# Patient Record
Sex: Female | Born: 1950
Health system: Southern US, Community
[De-identification: ages and names within clinical notes are randomized; demographics above are authoritative.]

## PROBLEM LIST (undated history)

## (undated) DIAGNOSIS — Z87442 Personal history of urinary calculi: Secondary | ICD-10-CM

## (undated) DIAGNOSIS — I2510 Atherosclerotic heart disease of native coronary artery without angina pectoris: Secondary | ICD-10-CM

## (undated) DIAGNOSIS — K219 Gastro-esophageal reflux disease without esophagitis: Secondary | ICD-10-CM

## (undated) DIAGNOSIS — E079 Disorder of thyroid, unspecified: Secondary | ICD-10-CM

## (undated) DIAGNOSIS — I10 Essential (primary) hypertension: Secondary | ICD-10-CM

## (undated) DIAGNOSIS — M1990 Unspecified osteoarthritis, unspecified site: Secondary | ICD-10-CM

## (undated) DIAGNOSIS — E039 Hypothyroidism, unspecified: Secondary | ICD-10-CM

## (undated) HISTORY — DX: Essential (primary) hypertension: I10

## (undated) HISTORY — DX: Disorder of thyroid, unspecified: E07.9

## (undated) HISTORY — DX: Atherosclerotic heart disease of native coronary artery without angina pectoris: I25.10

## (undated) HISTORY — PX: ABDOMINAL HYSTERECTOMY: SHX81

## (undated) HISTORY — PX: TONSILLECTOMY: SUR1361

## (undated) HISTORY — PX: APPENDECTOMY: SHX54

## (undated) HISTORY — DX: Unspecified osteoarthritis, unspecified site: M19.90

---

## 2000-12-10 ENCOUNTER — Ambulatory Visit (HOSPITAL_BASED_OUTPATIENT_CLINIC_OR_DEPARTMENT_OTHER): Admission: RE | Admit: 2000-12-10 | Discharge: 2000-12-10 | Payer: Self-pay | Admitting: Plastic Surgery

## 2003-11-21 ENCOUNTER — Ambulatory Visit (HOSPITAL_COMMUNITY): Admission: RE | Admit: 2003-11-21 | Discharge: 2003-11-21 | Payer: Self-pay | Admitting: Chiropractic Medicine

## 2006-03-27 ENCOUNTER — Ambulatory Visit (HOSPITAL_COMMUNITY): Admission: RE | Admit: 2006-03-27 | Discharge: 2006-03-27 | Payer: Self-pay | Admitting: General Surgery

## 2006-04-07 ENCOUNTER — Ambulatory Visit (HOSPITAL_COMMUNITY): Admission: RE | Admit: 2006-04-07 | Discharge: 2006-04-07 | Payer: Self-pay | Admitting: General Surgery

## 2010-12-07 ENCOUNTER — Encounter: Payer: Self-pay | Admitting: Endocrinology

## 2010-12-08 ENCOUNTER — Encounter: Payer: Self-pay | Admitting: General Surgery

## 2011-11-18 HISTORY — PX: KNEE SURGERY: SHX244

## 2012-04-01 ENCOUNTER — Ambulatory Visit
Admission: RE | Admit: 2012-04-01 | Discharge: 2012-04-01 | Disposition: A | Discharge status: Home / Self Care | Payer: PRIVATE HEALTH INSURANCE | Source: Ambulatory Visit | Attending: Family Medicine | Admitting: Family Medicine

## 2012-04-01 ENCOUNTER — Other Ambulatory Visit: Payer: Self-pay | Admitting: Family Medicine

## 2012-04-01 DIAGNOSIS — E049 Nontoxic goiter, unspecified: Secondary | ICD-10-CM

## 2012-11-01 ENCOUNTER — Ambulatory Visit (INDEPENDENT_AMBULATORY_CARE_PROVIDER_SITE_OTHER): Payer: PRIVATE HEALTH INSURANCE | Admitting: Internal Medicine

## 2012-11-01 ENCOUNTER — Ambulatory Visit: Payer: PRIVATE HEALTH INSURANCE

## 2012-11-01 VITALS — BP 166/87 | HR 97 | Temp 100.3°F | Resp 20 | Ht 63.0 in | Wt 152.0 lb

## 2012-11-01 DIAGNOSIS — J329 Chronic sinusitis, unspecified: Secondary | ICD-10-CM

## 2012-11-01 DIAGNOSIS — J3489 Other specified disorders of nose and nasal sinuses: Secondary | ICD-10-CM

## 2012-11-01 DIAGNOSIS — R509 Fever, unspecified: Secondary | ICD-10-CM

## 2012-11-01 LAB — POCT CBC
Granulocyte percent: 86.7 %G — AB (ref 37–80)
HCT, POC: 37.9 % (ref 37.7–47.9)
Hemoglobin: 11.7 g/dL — AB (ref 12.2–16.2)
Lymph, poc: 1.6 (ref 0.6–3.4)
MCH, POC: 28.1 pg (ref 27–31.2)
MCHC: 30.9 g/dL — AB (ref 31.8–35.4)
MCV: 90.8 fL (ref 80–97)
MID (cbc): 0.8 (ref 0–0.9)
MPV: 8.6 fL (ref 0–99.8)
POC Granulocyte: 15.6 — AB (ref 2–6.9)
POC LYMPH PERCENT: 9.1 %L — AB (ref 10–50)
POC MID %: 4.2 %M (ref 0–12)
Platelet Count, POC: 323 10*3/uL (ref 142–424)
RBC: 4.17 M/uL (ref 4.04–5.48)
RDW, POC: 158 %
WBC: 18 10*3/uL — AB (ref 4.6–10.2)

## 2012-11-01 LAB — POCT INFLUENZA A/B
Influenza A, POC: NEGATIVE
Influenza B, POC: NEGATIVE

## 2012-11-01 MED ORDER — PREDNISONE 20 MG PO TABS: ORAL_TABLET | ORAL | Status: DC

## 2012-11-01 MED ORDER — HYDROCODONE-ACETAMINOPHEN 5-500 MG PO TABS: 1.0000 | ORAL_TABLET | Freq: Three times a day (TID) | ORAL | Status: DC | PRN

## 2012-11-01 MED ORDER — AMOXICILLIN-POT CLAVULANATE 875-125 MG PO TABS: 1.0000 | ORAL_TABLET | Freq: Two times a day (BID) | ORAL | Status: DC

## 2012-11-01 NOTE — Patient Instructions (Addendum)
Start Augmentin tonight - take twice daily for 10 days Alternate tylenol 1000 mg with ibuprofen 400-600 mg every 3 hours. Lortab at night for pain May use Afrin nasal spray at night for no more than 3 days Follow up if no improvement in 48-72 hours

## 2012-11-01 NOTE — Progress Notes (Signed)
Subjective:    Patient ID: Stephanie Daniel, female    DOB: 1951-03-22, 61 y.o.   MRN: [id number]  HPI 61 year old female presents with 4 day history of sinus pain, fever, chills, and slight dry cough.  States she had a flu-like illness last week that resolved, and then she developed her current symptoms. Has history of sinus infections with last one being about a year ago. She did have sinus surgery when she was in her 70's.  Has been taking Tylenol Cold & Sinus and also used a netty pot last night.  Complains of severe frontal and maxillary sinus pain.      Review of Systems  Constitutional: Positive for fever and chills.  HENT: Positive for congestion, sore throat, rhinorrhea, postnasal drip and sinus pressure. Negative for ear pain.   Respiratory: Positive for cough (dry).   Gastrointestinal: Negative for nausea, vomiting and abdominal pain.  Skin: Negative for rash.  Neurological: Positive for headaches. Negative for dizziness and numbness.  All other systems reviewed and are negative.       Objective:   Physical Exam  Constitutional: She is oriented to person, place, and time. She appears well-developed and well-nourished.  HENT:  Head: Normocephalic and atraumatic.  Right Ear: Hearing, tympanic membrane, external ear and ear canal normal.  Left Ear: Hearing, tympanic membrane, external ear and ear canal normal.  Nose: Right sinus exhibits maxillary sinus tenderness and frontal sinus tenderness. Left sinus exhibits maxillary sinus tenderness and frontal sinus tenderness.  Mouth/Throat: Uvula is midline, oropharynx is clear and moist and mucous membranes are normal. No oropharyngeal exudate.  Eyes: Conjunctivae normal are normal.  Neck: Normal range of motion.  Cardiovascular: Normal rate and normal heart sounds.   Pulmonary/Chest: Effort normal and breath sounds normal.  Neurological: She is alert and oriented to person, place, and time.  Psychiatric: She has a normal mood and  affect. Her behavior is normal. Judgment and thought content normal.      Results for orders placed in visit on 11/01/12  POCT CBC      Component Value Range   WBC 18.0 (*) 4.6 - 10.2 K/uL   Lymph, poc 1.6  0.6 - 3.4   POC LYMPH PERCENT 9.1 (*) 10 - 50 %L   MID (cbc) 0.8  0 - 0.9   POC MID % 4.2  0 - 12 %M   POC Granulocyte 15.6 (*) 2 - 6.9   Granulocyte percent 86.7 (*) 37 - 80 %G   RBC 4.17  4.04 - 5.48 M/uL   Hemoglobin 11.7 (*) 12.2 - 16.2 g/dL   HCT, POC 40.9  81.1 - 47.9 %   MCV 90.8  80 - 97 fL   MCH, POC 28.1  27 - 31.2 pg   MCHC 30.9 (*) 31.8 - 35.4 g/dL   RDW, POC 914     Platelet Count, POC 323  142 - 424 K/uL   MPV 8.6  0 - 99.8 fL  POCT INFLUENZA A/B      Component Value Range   Influenza A, POC Negative     Influenza B, POC Negative      [facility] reading (PRIMARY) by  Dr. Merla Riches as right frontal sinus opacity.       Assessment & Plan:   1. Sinusitis  DG Sinuses Complete, predniSONE (DELTASONE) 20 MG tablet  2. Sinus pain  DG Sinuses Complete, amoxicillin-clavulanate (AUGMENTIN) 875-125 MG per tablet, HYDROcodone-acetaminophen (VICODIN) 5-500 MG per tablet  3. Fever  POCT CBC, POCT Influenza A/B   Start Augmentin tonight Prednisone taper to start in a.m. Lortab as needed for pain Afrin at night no more than 3 days RTC if symptoms not improving in 48-72 hours   I have reviewed and agree with documentation/plan. Robert P. Merla Riches, M.D.

## 2012-11-05 ENCOUNTER — Telehealth: Payer: Self-pay

## 2012-11-05 MED ORDER — FLUCONAZOLE 150 MG PO TABS: 150.0000 mg | ORAL_TABLET | Freq: Once | ORAL | Status: DC

## 2012-11-05 NOTE — Telephone Encounter (Signed)
HEATHER,  PT REQUESTS RX FOR YEAST INFECTION AS YOU DISCUSSED AT LAST OV    PT PHONE [phone]  [facility] ON [address]

## 2012-11-05 NOTE — Telephone Encounter (Signed)
Called pt to advise Rx sent in

## 2013-05-22 ENCOUNTER — Ambulatory Visit (INDEPENDENT_AMBULATORY_CARE_PROVIDER_SITE_OTHER): Payer: BC Managed Care – PPO | Admitting: Physician Assistant

## 2013-05-22 VITALS — BP 124/70 | HR 78 | Temp 98.0°F | Resp 18 | Ht 63.5 in | Wt 156.0 lb

## 2013-05-22 DIAGNOSIS — L0291 Cutaneous abscess, unspecified: Secondary | ICD-10-CM

## 2013-05-22 DIAGNOSIS — M25569 Pain in unspecified knee: Secondary | ICD-10-CM

## 2013-05-22 DIAGNOSIS — M25562 Pain in left knee: Secondary | ICD-10-CM

## 2013-05-22 DIAGNOSIS — L0390 Cellulitis, unspecified: Secondary | ICD-10-CM

## 2013-05-22 LAB — POCT CBC
Granulocyte percent: 72.8 %G (ref 37–80)
MID (cbc): 0.7 (ref 0–0.9)
MPV: 9.2 fL (ref 0–99.8)
POC Granulocyte: 6.3 (ref 2–6.9)
POC LYMPH PERCENT: 19.5 %L (ref 10–50)
POC MID %: 7.7 %M (ref 0–12)
Platelet Count, POC: 251 10*3/uL (ref 142–424)
RBC: 4.33 M/uL (ref 4.04–5.48)
RDW, POC: 16.7 %

## 2013-05-22 MED ORDER — TRAMADOL HCL 50 MG PO TABS: 50.0000 mg | ORAL_TABLET | Freq: Three times a day (TID) | ORAL | Status: DC | PRN

## 2013-05-22 MED ORDER — DOXYCYCLINE HYCLATE 50 MG PO CAPS: 100.0000 mg | ORAL_CAPSULE | Freq: Two times a day (BID) | ORAL | Status: DC

## 2013-05-22 MED ORDER — DOXYCYCLINE HYCLATE 100 MG PO CAPS: 100.0000 mg | ORAL_CAPSULE | Freq: Two times a day (BID) | ORAL | Status: DC

## 2013-05-22 NOTE — Progress Notes (Signed)
Patient ID: Stephanie Daniel MRN: [id number], DOB: 1951-06-29, 62 y.o. Date of Encounter: 05/22/2013, 4:55 PM  Primary Physician: No primary provider on file.  Chief Complaint: Bug bite left leg  HPI: 62 y.o. female with history below presents with 5 day history of bump on the outside of her left knee. Lesion has slowly been enlarging. At first she thought this was a mosquito bite, but then it became painful. This morning she began to have some purulent drainage. She has been feeling ok up until today when she just feels fatigued. Afebrile. No chills. She notes some swelling of her left leg and ankle as well.    Past Medical History  Diagnosis Date  . Hypertension   . Thyroid disease   . Arthritis      Home Meds: Prior to Admission medications   Medication Sig Start Date End Date Taking? Authorizing Provider  calcium-vitamin D (OSCAL WITH D) 500-200 MG-UNIT per tablet Take 1 tablet by mouth daily.   Yes Historical Provider, MD  fish oil-omega-3 fatty acids 1000 MG capsule Take 2 g by mouth daily.   Yes Historical Provider, MD  levothyroxine (SYNTHROID, LEVOTHROID) 75 MCG tablet Take 75 mcg by mouth daily.   Yes Historical Provider, MD  lisinopril (PRINIVIL,ZESTRIL) 10 MG tablet Take 10 mg by mouth daily.   Yes Historical Provider, MD  meloxicam (MOBIC) 15 MG tablet Take 15 mg by mouth daily.   Yes Historical Provider, MD  omeprazole (PRILOSEC OTC) 20 MG tablet Take 20 mg by mouth daily.   Yes Historical Provider, MD    Allergies: No Known Allergies  History   Social History  . Marital Status: Married    Spouse Name: N/A    Number of Children: N/A  . Years of Education: N/A   Occupational History  . Not on file.   Social History Main Topics  . Smoking status: Former Games developer  . Smokeless tobacco: Not on file  . Alcohol Use: Not on file  . Drug Use: Not on file  . Sexually Active: Not on file   Other Topics Concern  . Not on file   Social History Narrative  . No  narrative on file     Review of Systems: Constitutional: positive for fatigue. negative for chills or fever  Dermatological: see above   Physical Exam: Blood pressure 124/70, pulse 78, temperature 98 F (36.7 C), temperature source Oral, resp. rate 18, height 5' 3.5" (1.613 m), weight 156 lb (70.761 kg), SpO2 98.00%., Body mass index is 27.2 kg/(m^2). General: Well developed, well nourished, in no acute distress. Head: Normocephalic, atraumatic, eyes without discharge, sclera non-icteric, nares are without discharge.  Neck: Supple. Full ROM.  Lungs: Clear bilaterally to auscultation without wheezes, rales, or rhonchi. Breathing is unlabored. Heart: RRR with S1 S2. No murmurs, rubs, or gallops appreciated. Msk:  Strength and tone normal for age. Extremities/Skin: Warm and dry. No clubbing or cyanosis. Left lateral knee with 2 cm erythematous lesion with central eschar. Indurated. Minimal fluctuance. Lesion appears to be fairly superficial. Fair amount of surrounding erythema and STS extending distally to the ankle. Initially eschar was removed for culture. Culture taken as purulence was expressed. Unable to fully express all purulence, leaving the necessity of debridement, thus formal I and D was required.  Neuro: Alert and oriented X 3. Moves all extremities spontaneously. Gait is normal. CNII-XII grossly in tact. Psych:  Responds to questions appropriately with a normal affect.     PROCEDURE NOTE: Verbal  consent obtained. Risks and benefits of the procedure were explained to the patient. Patient made an informed decision to proceed with the procedure. Betadine prep per usual protocol. Local anesthesia obtained with 1% plain lidocaine 2 cc.  1 cm incision made with 11 blade along lesion.  Culture already taken. Small amount of purulence expressed. Lesion explored revealing no loculations. Irrigated with plain lidocaine. Packed with 1/4 inch plain packing. Dressed. Wound care  instructions including precautions with patient. Patient tolerated the procedure well. Recheck in 48 hours.       Labs: Results for orders placed in visit on 05/22/13  POCT CBC      Result Value Range   WBC 8.6  4.6 - 10.2 K/uL   Lymph, poc 1.7  0.6 - 3.4   POC LYMPH PERCENT 19.5  10 - 50 %L   MID (cbc) 0.7  0 - 0.9   POC MID % 7.7  0 - 12 %M   POC Granulocyte 6.3  2 - 6.9   Granulocyte percent 72.8  37 - 80 %G   RBC 4.33  4.04 - 5.48 M/uL   Hemoglobin 12.7  12.2 - 16.2 g/dL   HCT, POC 16.1  09.6 - 47.9 %   MCV 93.1  80 - 97 fL   MCH, POC 29.3  27 - 31.2 pg   MCHC 31.5 (*) 31.8 - 35.4 g/dL   RDW, POC 04.5     Platelet Count, POC 251  142 - 424 K/uL   MPV 9.2  0 - 99.8 fL    Wound culture pending  ASSESSMENT AND PLAN:  62 y.o. female with abscess and cellulitis of the left leg -Likely MRSA -Doxycycline 100 mg 1 po bid #20 no RF -Ultram 50 mg 1 po tid prn pain #30 no RF, SED -Wound care per above -Recheck 48 hours -RTC precautions  Signed, Eula Listen, PA-C 05/22/2013 4:55 PM

## 2013-05-24 ENCOUNTER — Other Ambulatory Visit: Payer: Self-pay | Admitting: Physician Assistant

## 2013-05-24 ENCOUNTER — Ambulatory Visit (INDEPENDENT_AMBULATORY_CARE_PROVIDER_SITE_OTHER): Payer: BC Managed Care – PPO | Admitting: Family Medicine

## 2013-05-24 ENCOUNTER — Ambulatory Visit: Payer: BC Managed Care – PPO

## 2013-05-24 VITALS — BP 98/70 | HR 79 | Temp 98.0°F | Resp 16 | Ht 63.0 in | Wt 156.0 lb

## 2013-05-24 DIAGNOSIS — L0291 Cutaneous abscess, unspecified: Secondary | ICD-10-CM

## 2013-05-24 DIAGNOSIS — L0390 Cellulitis, unspecified: Secondary | ICD-10-CM

## 2013-05-24 DIAGNOSIS — M25562 Pain in left knee: Secondary | ICD-10-CM

## 2013-05-24 DIAGNOSIS — L02419 Cutaneous abscess of limb, unspecified: Secondary | ICD-10-CM

## 2013-05-24 DIAGNOSIS — L03119 Cellulitis of unspecified part of limb: Secondary | ICD-10-CM

## 2013-05-24 DIAGNOSIS — M25569 Pain in unspecified knee: Secondary | ICD-10-CM

## 2013-05-24 LAB — POCT CBC
Hemoglobin: 12.3 g/dL (ref 12.2–16.2)
Lymph, poc: 2.2 (ref 0.6–3.4)
MCH, POC: 29.3 pg (ref 27–31.2)
MCHC: 31.1 g/dL — AB (ref 31.8–35.4)
MID (cbc): 0.7 (ref 0–0.9)
MPV: 10 fL (ref 0–99.8)
POC Granulocyte: 5.9 (ref 2–6.9)
POC MID %: 8.3 %M (ref 0–12)
Platelet Count, POC: 260 10*3/uL (ref 142–424)
RBC: 4.2 M/uL (ref 4.04–5.48)
WBC: 8.8 10*3/uL (ref 4.6–10.2)

## 2013-05-24 NOTE — Progress Notes (Signed)
Patient ID: JESUSITA JOCELYN MRN: [id number], DOB: [birth year] 62 y.o. Date of Encounter: 05/24/2013, 1:36 PM  Primary Physician: No primary provider on file.  Chief Complaint: Wound care   See previous note  HPI: 62 y.o. female presents for wound care s/p I&D on 05/22/13 Doing well Feels like she is improving, but still does not feel 100% better Developed some nausea after her initial OV on 05/22/13, that has since resolved Afebrile/ no chills No nausea or vomiting Tolerating Doxycycline Pain improving Erythema and swelling improving and has resolved along the ankle Previous note reviewed  Past Medical History  Diagnosis Date  . Hypertension   . Thyroid disease   . Arthritis      Home Meds: Prior to Admission medications   Medication Sig Start Date End Date Taking? Authorizing Provider  calcium-vitamin D (OSCAL WITH D) 500-200 MG-UNIT per tablet Take 1 tablet by mouth daily.   Yes Historical Provider, MD  doxycycline (VIBRAMYCIN) 50 MG capsule Take 2 capsules (100 mg total) by mouth 2 (two) times daily. 05/22/13  Yes Uzma Hellmer M Nesreen Albano, PA-C  fish oil-omega-3 fatty acids 1000 MG capsule Take 2 g by mouth daily.   Yes Historical Provider, MD  levothyroxine (SYNTHROID, LEVOTHROID) 75 MCG tablet Take 75 mcg by mouth daily.   Yes Historical Provider, MD  lisinopril (PRINIVIL,ZESTRIL) 10 MG tablet Take 10 mg by mouth daily.   Yes Historical Provider, MD  meloxicam (MOBIC) 15 MG tablet Take 15 mg by mouth daily.   Yes Historical Provider, MD  omeprazole (PRILOSEC OTC) 20 MG tablet Take 20 mg by mouth daily.   Yes Historical Provider, MD  traMADol (ULTRAM) 50 MG tablet Take 1 tablet (50 mg total) by mouth 3 (three) times daily as needed for pain. 05/22/13  Yes Kamare Caspers M Kue Fox, PA-C    Allergies: No Known Allergies  ROS: Constitutional: Afebrile, no chills Cardiovascular: negative for chest pain or palpitations Dermatological: Positive for wound, erythema, pain, and warmth  GI: No nausea or  vomiting   EXAM: Physical Exam: Blood pressure 98/70, pulse 79, temperature 98 F (36.7 C), temperature source Oral, resp. rate 16, height 5\' 3"  (1.6 m), weight 156 lb (70.761 kg), SpO2 98.00%., Body mass index is 27.64 kg/(m^2). General: Well developed, well nourished, in no acute distress. Nontoxic appearing. Head: Normocephalic, atraumatic, sclera non-icteric.  Neck: Supple. Lungs: Breathing is unlabored. Heart: Normal rate. Musculoskeletal: FROM.  Skin:  Warm and moist. Dressing and packing in place. Improved induration and erythema. Tenderness to palpation along the inferior and lateral aspect of the wound Neuro: Alert and oriented X 3. Moves all extremities spontaneously. Normal gait.  Psych:  Responds to questions appropriately with a normal affect.   PROCEDURE: Dressing and packing removed. Small amount of purulence expressed Wound debrided 2 cc of lidocaine with epi injected for local anesthesia Wound further debrided, wound tracts laterally   LAB: Culture:   GRAM STAIN  No WBC Seen   GRAM STAIN  No Squamous Epithelial Cells Seen   GRAM STAIN  No Organisms    Results for orders placed in visit on 05/24/13  POCT CBC      Result Value Range   WBC 8.8  4.6 - 10.2 K/uL   Lymph, poc 2.2  0.6 - 3.4   POC LYMPH PERCENT 24.8  10 - 50 %L   MID (cbc) 0.7  0 - 0.9   POC MID % 8.3  0 - 12 %M   POC Granulocyte 5.9  2 -  6.9   Granulocyte percent 66.9  37 - 80 %G   RBC 4.20  4.04 - 5.48 M/uL   Hemoglobin 12.3  12.2 - 16.2 g/dL   HCT, POC 03.4  74.2 - 47.9 %   MCV 94.0  80 - 97 fL   MCH, POC 29.3  27 - 31.2 pg   MCHC 31.1 (*) 31.8 - 35.4 g/dL   RDW, POC 59.5     Platelet Count, POC 260  142 - 424 K/uL   MPV 10.0  0 - 99.8 fL   Left knee: [facility] reading (PRIMARY) by  Dr. Patsy Lager. Negative  A/P: 62 y.o. female with cellulitis/abscess as above s/p I&D on 05/22/13 -Wound care per above -Referred to [facility] for appointment today -Continue Doxycycline -Patient to RTC on  05/25/13 if needed for further treatment of wound care -Pain improving -Daily dressing changes -Discussed with and examined by Dr. Patsy Lager  Signed, Eula Listen, PA-C 05/24/2013 1:36 PM

## 2013-05-25 ENCOUNTER — Ambulatory Visit (INDEPENDENT_AMBULATORY_CARE_PROVIDER_SITE_OTHER): Payer: BC Managed Care – PPO | Admitting: Physician Assistant

## 2013-05-25 VITALS — BP 110/68 | HR 74 | Temp 97.7°F | Resp 16 | Ht 63.0 in | Wt 155.2 lb

## 2013-05-25 DIAGNOSIS — L03119 Cellulitis of unspecified part of limb: Secondary | ICD-10-CM

## 2013-05-25 DIAGNOSIS — M25562 Pain in left knee: Secondary | ICD-10-CM

## 2013-05-25 DIAGNOSIS — M25569 Pain in unspecified knee: Secondary | ICD-10-CM

## 2013-05-25 DIAGNOSIS — L02419 Cutaneous abscess of limb, unspecified: Secondary | ICD-10-CM

## 2013-05-25 MED ORDER — CEFTRIAXONE SODIUM 1 G IJ SOLR
1.0000 g | Freq: Once | INTRAMUSCULAR | Status: AC
  Administered 2013-05-25: 1 g via INTRAMUSCULAR

## 2013-05-25 MED ORDER — TRAMADOL HCL 50 MG PO TABS: 50.0000 mg | ORAL_TABLET | Freq: Three times a day (TID) | ORAL | Status: DC | PRN

## 2013-05-25 NOTE — Progress Notes (Signed)
Patient ID: Stephanie CALIXTO MRN: [id number], DOB: 1951/09/04, 62 y.o. Date of Encounter: 05/25/2013, 2:42 PM  Primary Physician: No primary provider on file.  Chief Complaint: Follow up  HPI: 62 y.o. female with history below presents for follow up of left knee cellulitis/abscess. Patient was seen by orthopedics 05/24/13. States they said no infection into the bone or joint. She is here today for continued wound care. Continuing to improve. Lessening pain. Afebrile. Tolerating antibiotic. No nausea or vomiting. Wound culture still pending, with no preliminary result.    Past Medical History  Diagnosis Date  . Hypertension   . Thyroid disease   . Arthritis      Home Meds: Prior to Admission medications   Medication Sig Start Date End Date Taking? Authorizing Provider  calcium-vitamin D (OSCAL WITH D) 500-200 MG-UNIT per tablet Take 1 tablet by mouth daily.   Yes Historical Provider, MD  doxycycline (VIBRAMYCIN) 50 MG capsule Take 2 capsules (100 mg total) by mouth 2 (two) times daily. 05/22/13  Yes Ryan M Dunn, PA-C  fish oil-omega-3 fatty acids 1000 MG capsule Take 2 g by mouth daily.   Yes Historical Provider, MD  levothyroxine (SYNTHROID, LEVOTHROID) 75 MCG tablet Take 75 mcg by mouth daily.   Yes Historical Provider, MD  lisinopril (PRINIVIL,ZESTRIL) 10 MG tablet Take 10 mg by mouth daily.   Yes Historical Provider, MD  meloxicam (MOBIC) 15 MG tablet Take 15 mg by mouth daily.   Yes Historical Provider, MD  omeprazole (PRILOSEC OTC) 20 MG tablet Take 20 mg by mouth daily.   Yes Historical Provider, MD  traMADol (ULTRAM) 50 MG tablet Take 1 tablet (50 mg total) by mouth 3 (three) times daily as needed for pain. 05/25/13  Yes Ryan M Dunn, PA-C    Allergies: No Known Allergies  History   Social History  . Marital Status: Married    Spouse Name: N/A    Number of Children: N/A  . Years of Education: N/A   Occupational History  . Not on file.   Social History Main Topics  . Smoking  status: Former Games developer  . Smokeless tobacco: Not on file  . Alcohol Use: Not on file  . Drug Use: Not on file  . Sexually Active: Not on file   Other Topics Concern  . Not on file   Social History Narrative  . No narrative on file     Review of Systems: Constitutional: negative for chills, fever, or fatigue  Abdominal: negative for abdominal pain, nausea, vomiting, or diarrhea Dermatological: see above   Physical Exam: Blood pressure 110/68, pulse 74, temperature 97.7 F (36.5 C), temperature source Oral, resp. rate 16, height 5\' 3"  (1.6 m), weight 155 lb 3.2 oz (70.398 kg), SpO2 98.00%., Body mass index is 27.5 kg/(m^2). General: Well developed, well nourished, in no acute distress. Head: Normocephalic, atraumatic, eyes without discharge, sclera non-icteric, nares are without discharge.  Neck: Supple. Full ROM.  Lungs: Breathing is unlabored. Heart: Regular rate. Msk:  Strength and tone normal for age. Extremities/Skin: Warm and dry. No clubbing or cyanosis. No edema. Left knee: Dressing in place. Small amount of purulent drainage expressed once dressing was removed. Visualization of the wound reveals necrotic tissue along the basement. Improving erythema along the wound. Still with TTP along the inferior aspect. FROM.  Neuro: Alert and oriented X 3. Moves all extremities spontaneously. Gait is normal. CNII-XII grossly in tact. Psych:  Responds to questions appropriately with a normal affect.   Labs:  Staph aureus   PROCEDURE NOTE: Verbal consent obtained. Risks and benefits of the procedure were explained to the patient. Patient made an informed decision to proceed with the procedure. Betadine prep per usual protocol. Local anesthesia obtained with 2% lidocaine with epi 3 cc.   3 cm extension of incision made with 15 blade along lesion.  Culture previously taken.  No purulence expressed. Wound debrided to healthy wound bed.  Lesion explored revealing no  loculations. Packed with 1/4 inch plain packing. Dressed. Wound care instructions including precautions with patient. Patient tolerated the procedure well. Recheck in 24 hours.       ASSESSMENT AND PLAN:  62 y.o. female with cellulitis/abscess left knee -Wound care per above -Rocephin 1 gram IM -Continue Doxycycline -Ultram 50 mg 1 po tid prn pain #30 no RF, SED -Recheck 24 hours   Signed, Eula Listen, PA-C 05/25/2013 2:42 PM

## 2013-05-26 ENCOUNTER — Ambulatory Visit (INDEPENDENT_AMBULATORY_CARE_PROVIDER_SITE_OTHER): Payer: BC Managed Care – PPO | Admitting: Physician Assistant

## 2013-05-26 VITALS — BP 128/72 | HR 79 | Temp 97.5°F | Resp 16 | Ht 63.0 in | Wt 154.0 lb

## 2013-05-26 DIAGNOSIS — L02416 Cutaneous abscess of left lower limb: Secondary | ICD-10-CM

## 2013-05-26 DIAGNOSIS — L02419 Cutaneous abscess of limb, unspecified: Secondary | ICD-10-CM

## 2013-05-26 LAB — WOUND CULTURE
Gram Stain: NONE SEEN
Gram Stain: NONE SEEN

## 2013-05-26 MED ORDER — FLUCONAZOLE 150 MG PO TABS: 150.0000 mg | ORAL_TABLET | Freq: Once | ORAL | Status: DC

## 2013-05-26 MED ORDER — SULFAMETHOXAZOLE-TMP DS 800-160 MG PO TABS: 1.0000 | ORAL_TABLET | Freq: Two times a day (BID) | ORAL | Status: DC

## 2013-05-26 NOTE — Progress Notes (Signed)
   Patient ID: Stephanie Daniel MRN: [id number], DOB: 09-16-1951 62 y.o. Date of Encounter: 05/26/2013, 6:25 PM  Primary Physician: No PCP Per Patient  Chief Complaint: Wound care   See previous note  HPI: 62 y.o. female presents for wound care s/p I&D on 05/22/13 Doing well No issues or complaints Afebrile/ no chills No nausea or vomiting Feeling much better Finishes Doxycycline today as she was taking 2 po bid instead of 1 po bid Pain much improved Daily dressing change Previous note reviewed  Past Medical History  Diagnosis Date  . Hypertension   . Thyroid disease   . Arthritis      Home Meds: Prior to Admission medications   Medication Sig Start Date End Date Taking? Authorizing Provider  calcium-vitamin D (OSCAL WITH D) 500-200 MG-UNIT per tablet Take 1 tablet by mouth daily.   Yes Historical Provider, MD  doxycycline (VIBRAMYCIN) 50 MG capsule Take 2 capsules (100 mg total) by mouth 2 (two) times daily. 05/22/13  Yes Lashaye Fisk M Yuriel Lopezmartinez, PA-C  fish oil-omega-3 fatty acids 1000 MG capsule Take 2 g by mouth daily.   Yes Historical Provider, MD  levothyroxine (SYNTHROID, LEVOTHROID) 75 MCG tablet Take 75 mcg by mouth daily.   Yes Historical Provider, MD  lisinopril (PRINIVIL,ZESTRIL) 10 MG tablet Take 10 mg by mouth daily.   Yes Historical Provider, MD  meloxicam (MOBIC) 15 MG tablet Take 15 mg by mouth daily.   Yes Historical Provider, MD  omeprazole (PRILOSEC OTC) 20 MG tablet Take 20 mg by mouth daily.   Yes Historical Provider, MD  traMADol (ULTRAM) 50 MG tablet Take 1 tablet (50 mg total) by mouth 3 (three) times daily as needed for pain. 05/25/13  Yes Oluchi Pucci M Derrian Poli, PA-C    Allergies: No Known Allergies  ROS: Constitutional: Afebrile, no chills Cardiovascular: negative for chest pain or palpitations Dermatological: Positive for wound GI: No nausea or vomiting   EXAM: Physical Exam: Blood pressure 128/72, pulse 79, temperature 97.5 F (36.4 C), temperature source Oral, resp.  rate 16, height 5\' 3"  (1.6 m), weight 154 lb (69.854 kg), SpO2 99.00%., Body mass index is 27.29 kg/(m^2). General: Well developed, well nourished, in no acute distress. Nontoxic appearing. Head: Normocephalic, atraumatic, sclera non-icteric.  Neck: Supple. Lungs: Breathing is unlabored. Heart: Normal rate. Skin:  Warm and moist. Dressing and packing in place. Improving induration, erythema, and tenderness to palpation. Neuro: Alert and oriented X 3. Moves all extremities spontaneously. Normal gait.  Psych:  Responds to questions appropriately with a normal affect.   PROCEDURE: Dressing and packing removed. Minimal purulence expressed Slight amount of debridement performed  Wound bed healthy Irrigated with 1% plain lidocaine 5 cc. Repacked with small amount of 1/4 inch plain packing Dressing applied  LAB: Culture: MRSA, Doxycycline sensitive  A/P: 62 y.o. female with improving cellulitis/abscess as above s/p I&D on 05/22/13 -Wound care per above -Bactrim 1 po bid #20 no RF -Pain much improved -Daily dressing changes -Recheck 48 hours  Signed, Eula Listen, PA-C 05/26/2013 6:25 PM

## 2013-05-28 ENCOUNTER — Ambulatory Visit (INDEPENDENT_AMBULATORY_CARE_PROVIDER_SITE_OTHER): Payer: BC Managed Care – PPO | Admitting: Physician Assistant

## 2013-05-28 VITALS — BP 116/66 | HR 77 | Temp 97.6°F | Resp 16 | Ht 63.0 in | Wt 154.0 lb

## 2013-05-28 DIAGNOSIS — IMO0002 Reserved for concepts with insufficient information to code with codable children: Secondary | ICD-10-CM

## 2013-05-28 DIAGNOSIS — L02419 Cutaneous abscess of limb, unspecified: Secondary | ICD-10-CM

## 2013-05-28 NOTE — Progress Notes (Signed)
  Subjective:    Patient ID: Stephanie Daniel, female    DOB: 07-03-1951, 62 y.o.   MRN: [id number]  HPI   [name] is a very pleasant 62 yr old female here for wound care.  She is s/p I&D of an abscess on her left knee.  Previous notes reviewed.  Pt states she continues to improve.  Pain is much less than it was.  She continues to take the antibiotics as directed and tolerates them well.  Continues changes the dressing daily.  Denies fever, chills, nausea, vomiting    Review of Systems  Constitutional: Negative.   HENT: Negative.   Eyes: Negative.   Respiratory: Negative.   Cardiovascular: Negative.   Gastrointestinal: Negative.   Musculoskeletal: Negative.   Skin: Positive for wound.  Neurological: Negative.        Objective:   Physical Exam  Vitals reviewed. Constitutional: She is oriented to person, place, and time. She appears well-developed and well-nourished. No distress.  HENT:  Head: Normocephalic and atraumatic.  Eyes: Conjunctivae are normal. No scleral icterus.  Pulmonary/Chest: Effort normal.  Neurological: She is alert and oriented to person, place, and time.  Skin: Skin is warm and dry.     Healing wound at left knee; small amount of surrounding induration remains but no warmth or erythema  Psychiatric: She has a normal mood and affect. Her behavior is normal.    Wound Care: Dressing and packing removed.  Packing saturated with purulence.  Able to express a very small amount of drainage from the wound.  Irrigated with 5cc 2% plain lidocaine.  Repacked and dressed.  Pt tolerated very well.        Assessment & Plan:  Abscess or cellulitis of knee   [name] is a very pleasant 62 yr old woman here for wound care.  The wound appears to be healing quite well.  I was able to express only a small amount of purulence.  I have repacked the area.  Continue abx.  Continue daily dressing changes.  Pt will follow up with Eula Listen PA-C in 48 hours as planned.  Fast track  card updated.

## 2013-05-30 ENCOUNTER — Ambulatory Visit (INDEPENDENT_AMBULATORY_CARE_PROVIDER_SITE_OTHER): Payer: BC Managed Care – PPO | Admitting: Physician Assistant

## 2013-05-30 VITALS — BP 136/84 | HR 81 | Temp 97.8°F | Resp 18 | Ht 63.0 in | Wt 154.0 lb

## 2013-05-30 DIAGNOSIS — L02419 Cutaneous abscess of limb, unspecified: Secondary | ICD-10-CM

## 2013-05-30 DIAGNOSIS — L02416 Cutaneous abscess of left lower limb: Secondary | ICD-10-CM

## 2013-05-30 NOTE — Progress Notes (Signed)
   Patient ID: Stephanie Daniel MRN: [id number], DOB: 11/20/1950 62 y.o. Date of Encounter: 05/30/2013, 7:08 PM  Primary Physician: No PCP Per Patient  Chief Complaint: Wound care   See previous note  HPI: 62 y.o. female presents for wound care s/p I&D on 05/22/13 Doing well No issues or complaints Afebrile/ no chills No nausea or vomiting Tolerating Bactrim Pain resolved Daily dressing change Previous note reviewed  Past Medical History  Diagnosis Date  . Hypertension   . Thyroid disease   . Arthritis      Home Meds: Prior to Admission medications   Medication Sig Start Date End Date Taking? Authorizing Provider  calcium-vitamin D (OSCAL WITH D) 500-200 MG-UNIT per tablet Take 1 tablet by mouth daily.   Yes Historical Provider, MD  doxycycline (VIBRAMYCIN) 50 MG capsule Take 2 capsules (100 mg total) by mouth 2 (two) times daily. 05/22/13  Yes Marciano Mundt M [place], PA-C  fish oil-omega-3 fatty acids 1000 MG capsule Take 2 g by mouth daily.   Yes Historical Provider, MD  fluconazole (DIFLUCAN) 150 MG tablet Take 1 tablet (150 mg total) by mouth once. 05/26/13  Yes Lissandro Dilorenzo M Pansie Guggisberg, PA-C  levothyroxine (SYNTHROID, LEVOTHROID) 75 MCG tablet Take 75 mcg by mouth daily.   Yes Historical Provider, MD  lisinopril (PRINIVIL,ZESTRIL) 10 MG tablet Take 10 mg by mouth daily.   Yes Historical Provider, MD  meloxicam (MOBIC) 15 MG tablet Take 15 mg by mouth daily.   Yes Historical Provider, MD  omeprazole (PRILOSEC OTC) 20 MG tablet Take 20 mg by mouth daily.   Yes Historical Provider, MD  sulfamethoxazole-trimethoprim (BACTRIM DS) 800-160 MG per tablet Take 1 tablet by mouth 2 (two) times daily. 05/26/13  Yes Cotey Rakes M Dejha King, PA-C  traMADol (ULTRAM) 50 MG tablet Take 1 tablet (50 mg total) by mouth 3 (three) times daily as needed for pain. 05/25/13  Yes Leory Allinson M Rosmary Dionisio, PA-C    Allergies: No Known Allergies  ROS: Constitutional: Afebrile, no chills Cardiovascular: negative for chest pain or  palpitations Dermatological: Positive for wound. Negative for erythema, pain, or warmth  GI: No nausea or vomiting   EXAM: Physical Exam: Blood pressure 136/84, pulse 81, temperature 97.8 F (36.6 C), temperature source Oral, resp. rate 18, height 5\' 3"  (1.6 m), weight 154 lb (69.854 kg), SpO2 97.00%., Body mass index is 27.29 kg/(m^2). General: Well developed, well nourished, in no acute distress. Nontoxic appearing. Head: Normocephalic, atraumatic, sclera non-icteric.  Neck: Supple. Lungs: Breathing is unlabored. Heart: Normal rate. Skin:  Warm and moist. Dressing and packing in place. No induration, erythema, or tenderness to palpation. Neuro: Alert and oriented X 3. Moves all extremities spontaneously. Normal gait.  Psych:  Responds to questions appropriately with a normal affect.   PROCEDURE: Dressing and packing removed. Small purulence along the packing No purulence expressed Wound bed healthy with granulation tissue Irrigated with 1% plain lidocaine 5 cc. Repacked with small amount of 1/4 inch plain packing Dressing applied  LAB: Culture: MRSA  A/P: 62 y.o. female with resolving cellulitis/abscess as above s/p I&D on 05/22/13 -Wound care per above -Continue Bactrim -Pain well controlled -Daily dressing changes -Recheck 48 hours  Signed, Eula Listen, PA-C 05/30/2013 7:08 PM

## 2013-06-01 ENCOUNTER — Ambulatory Visit (INDEPENDENT_AMBULATORY_CARE_PROVIDER_SITE_OTHER): Payer: BC Managed Care – PPO | Admitting: Physician Assistant

## 2013-06-01 VITALS — BP 114/60 | HR 67 | Temp 97.5°F | Resp 16

## 2013-06-01 DIAGNOSIS — L02416 Cutaneous abscess of left lower limb: Secondary | ICD-10-CM

## 2013-06-01 DIAGNOSIS — L03119 Cellulitis of unspecified part of limb: Secondary | ICD-10-CM

## 2013-06-01 DIAGNOSIS — L02419 Cutaneous abscess of limb, unspecified: Secondary | ICD-10-CM

## 2013-06-01 MED ORDER — SULFAMETHOXAZOLE-TMP DS 800-160 MG PO TABS: 1.0000 | ORAL_TABLET | Freq: Two times a day (BID) | ORAL | Status: DC

## 2013-06-01 NOTE — Progress Notes (Signed)
   Patient ID: Stephanie Daniel MRN: [id number], DOB: [birth year] 62 y.o. Date of Encounter: 06/01/2013, 11:51 AM  Primary Physician: No PCP Per Patient  Chief Complaint: Wound care   See previous note  HPI: 62 y.o. female presents for wound care s/p I&D on 05/22/13 Doing well No issues or complaints Afebrile/ no chills No nausea or vomiting Tolerating Bactrim Pain resolved Daily dressing change Previous note reviewed  Past Medical History  Diagnosis Date  . Hypertension   . Thyroid disease   . Arthritis      Home Meds: Prior to Admission medications   Medication Sig Start Date End Date Taking? Authorizing Provider  calcium-vitamin D (OSCAL WITH D) 500-200 MG-UNIT per tablet Take 1 tablet by mouth daily.    Historical Provider, MD  doxycycline (VIBRAMYCIN) 50 MG capsule Take 2 capsules (100 mg total) by mouth 2 (two) times daily. 05/22/13   Sondra Barges, PA-C  fish oil-omega-3 fatty acids 1000 MG capsule Take 2 g by mouth daily.    Historical Provider, MD  fluconazole (DIFLUCAN) 150 MG tablet Take 1 tablet (150 mg total) by mouth once. 05/26/13   Sondra Barges, PA-C  levothyroxine (SYNTHROID, LEVOTHROID) 75 MCG tablet Take 75 mcg by mouth daily.    Historical Provider, MD  lisinopril (PRINIVIL,ZESTRIL) 10 MG tablet Take 10 mg by mouth daily.    Historical Provider, MD  meloxicam (MOBIC) 15 MG tablet Take 15 mg by mouth daily.    Historical Provider, MD  omeprazole (PRILOSEC OTC) 20 MG tablet Take 20 mg by mouth daily.    Historical Provider, MD  sulfamethoxazole-trimethoprim (BACTRIM DS) 800-160 MG per tablet Take 1 tablet by mouth 2 (two) times daily. 05/26/13   Sondra Barges, PA-C  traMADol (ULTRAM) 50 MG tablet Take 1 tablet (50 mg total) by mouth 3 (three) times daily as needed for pain. 05/25/13   Sondra Barges, PA-C    Allergies: No Known Allergies  ROS: Constitutional: Afebrile, no chills Cardiovascular: negative for chest pain or palpitations Dermatological: Positive for wound.  Negative for erythema, pain, or warmth  GI: No nausea or vomiting   EXAM: Physical Exam: Blood pressure 114/60, pulse 67, temperature 97.5 F (36.4 C), temperature source Oral, resp. rate 16, SpO2 98.00%., There is no weight on file to calculate BMI. General: Well developed, well nourished, in no acute distress. Nontoxic appearing. Head: Normocephalic, atraumatic, sclera non-icteric.  Neck: Supple. Lungs: Breathing is unlabored. Heart: Normal rate. Skin:  Warm and moist. Dressing and packing in place. No induration, erythema, or tenderness to palpation. Neuro: Alert and oriented X 3. Moves all extremities spontaneously. Normal gait.  Psych:  Responds to questions appropriately with a normal affect.   PROCEDURE: Dressing and packing removed. No purulence expressed Wound bed healthy Irrigated with 1% plain lidocaine 5 cc. No further packing required Dressing applied  LAB: Culture: MRSA  A/P: 62 y.o. female with resolved cellulitis/abscess as above s/p I&D on 05/22/13 -Wound care per above -Continue Bactrim -Bactrim 1 po bid #14 no RF -Pain well controlled -Daily dressing changes -Recheck 48 hours  Signed, Eula Listen, PA-C 06/01/2013 11:51 AM

## 2013-06-19 ENCOUNTER — Ambulatory Visit (INDEPENDENT_AMBULATORY_CARE_PROVIDER_SITE_OTHER): Payer: BC Managed Care – PPO | Admitting: Family Medicine

## 2013-06-19 VITALS — BP 118/70 | HR 80 | Temp 98.0°F | Resp 18 | Wt 158.0 lb

## 2013-06-19 DIAGNOSIS — Z5189 Encounter for other specified aftercare: Secondary | ICD-10-CM

## 2013-06-19 DIAGNOSIS — IMO0001 Reserved for inherently not codable concepts without codable children: Secondary | ICD-10-CM

## 2013-06-19 MED ORDER — MUPIROCIN 2 % EX OINT: TOPICAL_OINTMENT | Freq: Two times a day (BID) | CUTANEOUS | Status: DC

## 2013-06-19 NOTE — Progress Notes (Signed)
With permission, the eschar on the outer LEFT knee was cleansed with an alcohol prep pad and then lifted to reveal a small amount of purulence in a shallow wound cavity. Mupirocin ointment and bandage applied.

## 2013-06-19 NOTE — Progress Notes (Signed)
[facility]:  Office Visit  Chief Complaint:  Chief Complaint  Patient presents with  . Knee Pain    left    HPI: Stephanie Daniel is a 62 y.o. female who complains of  Left knee recheck for cellulitis and abscess which was I and D on 05/22/12. She grew out MRSA and has been on 2 rounds of abx Doxycycline and Bactrrim. She is here today because the scab that formed over the site of the I and D is more erythematous around the border, tender to touch. She denies fevers, chills. She also has another spot on her lower leg that is an acne like pustule that itches. Deneis fevers, full ROm of knee  Past Medical History  Diagnosis Date  . Hypertension   . Thyroid disease   . Arthritis    Past Surgical History  Procedure Laterality Date  . Knee surgery  2013  . Abdominal hysterectomy    . Appendectomy     History   Social History  . Marital Status: Married    Spouse Name: N/A    Number of Children: N/A  . Years of Education: N/A   Social History Main Topics  . Smoking status: Former Games developer  . Smokeless tobacco: None  . Alcohol Use: None  . Drug Use: None  . Sexually Active: None   Other Topics Concern  . None   Social History Narrative  . None   Family History  Problem Relation Age of Onset  . COPD Mother   . Heart disease Mother   . Heart disease Father   . Cancer Father   . COPD Brother   . Heart disease Brother   . Hypertension Son   . Liver disease Son    No Known Allergies Prior to Admission medications   Medication Sig Start Date End Date Taking? Authorizing Provider  calcium-vitamin D (OSCAL WITH D) 500-200 MG-UNIT per tablet Take 1 tablet by mouth daily.   Yes Historical Provider, MD  fish oil-omega-3 fatty acids 1000 MG capsule Take 2 g by mouth daily.   Yes Historical Provider, MD  levothyroxine (SYNTHROID, LEVOTHROID) 75 MCG tablet Take 75 mcg by mouth daily.   Yes Historical Provider, MD  lisinopril (PRINIVIL,ZESTRIL) 10 MG tablet Take  10 mg by mouth daily.   Yes Historical Provider, MD  meloxicam (MOBIC) 15 MG tablet Take 15 mg by mouth daily.   Yes Historical Provider, MD  omeprazole (PRILOSEC OTC) 20 MG tablet Take 20 mg by mouth daily.    Historical Provider, MD     ROS: The patient denies fevers, chills, night sweats, unintentional weight loss, chest pain, palpitations, wheezing, dyspnea on exertion, nausea, vomiting, abdominal pain, dysuria, hematuria, melena, numbness, weakness, or tingling.   All other systems have been reviewed and were otherwise negative with the exception of those mentioned in the HPI and as above.    PHYSICAL EXAM: Filed Vitals:   06/19/13 1042  BP: 118/70  Pulse: 80  Temp: 98 F (36.7 C)  Resp: 18   Filed Vitals:   06/19/13 1042  Weight: 158 lb (71.668 kg)   Body mass index is 28 kg/(m^2).  General: Alert, no acute distress HEENT:  Normocephalic, atraumatic, oropharynx patent.  Cardiovascular:  Regular rate and rhythm, no rubs murmurs or gallops.  No Carotid bruits, radial pulse intact. No pedal edema.  Respiratory: Clear to auscultation bilaterally.  No wheezes, rales, or rhonchi.  No cyanosis, no use of accessory musculature GI:  No organomegaly, abdomen is soft and non-tender, positive bowel sounds.  No masses. Skin:+ eschar with underlying erythema and what appears to be pus surrounding edge od black eschar. Tender upon palpation. Full ROM of knee, no warmth.  Neurologic: Facial musculature symmetric. Psychiatric: Patient is appropriate throughout our interaction. Lymphatic: No cervical lymphadenopathy Musculoskeletal: Gait intact.   LABS: Results for orders placed in visit on 05/24/13  POCT CBC      Result Value Range   WBC 8.8  4.6 - 10.2 K/uL   Lymph, poc 2.2  0.6 - 3.4   POC LYMPH PERCENT 24.8  10 - 50 %L   MID (cbc) 0.7  0 - 0.9   POC MID % 8.3  0 - 12 %M   POC Granulocyte 5.9  2 - 6.9   Granulocyte percent 66.9  37 - 80 %G   RBC 4.20  4.04 - 5.48 M/uL    Hemoglobin 12.3  12.2 - 16.2 g/dL   HCT, POC 40.9  81.1 - 47.9 %   MCV 94.0  80 - 97 fL   MCH, POC 29.3  27 - 31.2 pg   MCHC 31.1 (*) 31.8 - 35.4 g/dL   RDW, POC 91.4     Platelet Count, POC 260  142 - 424 K/uL   MPV 10.0  0 - 99.8 fL     EKG/XRAY:   Primary read interpreted by Dr. Conley Rolls at [facility]   ASSESSMENT/PLAN: Encounter Diagnosis  Name Primary?  Marland Kitchen Wound abscess, subsequent encounter Yes   Bactroban BID x 10 days No e/o sepsis/abscess Eschar lifted showed only shallow wound with minimal pus Wound care as directed Wound cx pending F/u prn     Halsey Persaud PHUONG, DO 06/19/2013 11:02 AM

## 2013-06-21 ENCOUNTER — Ambulatory Visit (INDEPENDENT_AMBULATORY_CARE_PROVIDER_SITE_OTHER): Payer: BC Managed Care – PPO | Admitting: Physician Assistant

## 2013-06-21 VITALS — BP 134/78 | HR 81 | Temp 98.0°F | Resp 18 | Ht 64.0 in | Wt 155.0 lb

## 2013-06-21 DIAGNOSIS — L0390 Cellulitis, unspecified: Secondary | ICD-10-CM

## 2013-06-21 DIAGNOSIS — Z22322 Carrier or suspected carrier of Methicillin resistant Staphylococcus aureus: Secondary | ICD-10-CM

## 2013-06-21 DIAGNOSIS — L0291 Cutaneous abscess, unspecified: Secondary | ICD-10-CM

## 2013-06-21 LAB — WOUND CULTURE
Gram Stain: NONE SEEN
Gram Stain: NONE SEEN

## 2013-06-21 LAB — POCT CBC
Lymph, poc: 1.8 (ref 0.6–3.4)
MCH, POC: 29.6 pg (ref 27–31.2)
MCHC: 31.2 g/dL — AB (ref 31.8–35.4)
MID (cbc): 0.8 (ref 0–0.9)
MPV: 8.4 fL (ref 0–99.8)
POC Granulocyte: 9 — AB (ref 2–6.9)
POC MID %: 6.7 %M (ref 0–12)
Platelet Count, POC: 231 10*3/uL (ref 142–424)
RDW, POC: 15.9 %
WBC: 11.6 10*3/uL — AB (ref 4.6–10.2)

## 2013-06-21 MED ORDER — MUPIROCIN CALCIUM 2 % NA OINT: TOPICAL_OINTMENT | Freq: Two times a day (BID) | NASAL | Status: DC

## 2013-06-21 MED ORDER — SULFAMETHOXAZOLE-TMP DS 800-160 MG PO TABS: 1.0000 | ORAL_TABLET | Freq: Two times a day (BID) | ORAL | Status: DC

## 2013-06-21 NOTE — Progress Notes (Signed)
Patient ID: Stephanie Daniel MRN: [id number], DOB: 1951/04/15, 62 y.o. Date of Encounter: 06/21/2013, 11:22 AM  Primary Physician: No PCP Per Patient  Chief Complaint: Follow up cellulitis  HPI: 62 y.o. female with history below presents for follow up cellulitis of the left knee and lower leg. Patient initially with I and D of abscess along the left knee on 05/22/13 that did well and had fully resolved. She presented back to clinic on 06/19/13 stating that she had developed a "blood blister" along this site. She also had developed a small pustule along the medial aspect of distal lower left leg. The surrounding tissue along the lower lesion became erythematous 24 hours ago. The lesion along the left knee is healing quit well without TTP or erythema. Afebrile. No chills. No nausea. No vomiting. She has not been applying warm compresses to the lower lesion. It is sore to the touch. No drainage or discharge. She has been applying the Bactroban ointment to each lesion.    Past Medical History  Diagnosis Date  . Hypertension   . Thyroid disease   . Arthritis      Home Meds: Prior to Admission medications   Medication Sig Start Date End Date Taking? Authorizing Provider  calcium-vitamin D (OSCAL WITH D) 500-200 MG-UNIT per tablet Take 1 tablet by mouth daily.   Yes Historical Provider, MD  fish oil-omega-3 fatty acids 1000 MG capsule Take 2 g by mouth daily.   Yes Historical Provider, MD  levothyroxine (SYNTHROID, LEVOTHROID) 75 MCG tablet Take 75 mcg by mouth daily.   Yes Historical Provider, MD  lisinopril (PRINIVIL,ZESTRIL) 10 MG tablet Take 10 mg by mouth daily.   Yes Historical Provider, MD  meloxicam (MOBIC) 15 MG tablet Take 15 mg by mouth daily.   Yes Historical Provider, MD  mupirocin ointment (BACTROBAN) 2 % Apply topically 2 (two) times daily. X 10 days 06/19/13  Yes Thao P Le, DO  omeprazole (PRILOSEC OTC) 20 MG tablet Take 20 mg by mouth daily.    Historical Provider, MD    Allergies: No  Known Allergies  History   Social History  . Marital Status: Married    Spouse Name: N/A    Number of Children: N/A  . Years of Education: N/A   Occupational History  . Not on file.   Social History Main Topics  . Smoking status: Former Games developer  . Smokeless tobacco: Not on file  . Alcohol Use: Not on file  . Drug Use: Not on file  . Sexually Active: Not on file   Other Topics Concern  . Not on file   Social History Narrative  . No narrative on file     Review of Systems: Constitutional: negative for chills, fever, or fatigue  Dermatological: see above   Physical Exam: Blood pressure 134/78, pulse 81, temperature 98 F (36.7 C), temperature source Oral, resp. rate 18, height 5\' 4"  (1.626 m), weight 155 lb (70.308 kg), SpO2 99.00%., Body mass index is 26.59 kg/(m^2). General: Well developed, well nourished, in no acute distress. Well appearing.  Head: Normocephalic, atraumatic, eyes without discharge, sclera non-icteric, nares are without discharge.   Neck: Supple. Full ROM.  Lungs: Breathing is unlabored. Heart: Regular rate. Msk:  Strength and tone normal for age. Extremities/Skin: Warm and dry. No clubbing or cyanosis. No edema. Left knee: well healing lesion along the lateral aspect with eschar present. No erythema, STS, or TTP. Full ROM, no warmth. Left lower leg along the medial aspect: pinpoint  lesion, previously unroofed with surrounding erythema extending to 3 cm. TTP and warm. No fluctuance.    Neuro: Alert and oriented X 3. Moves all extremities spontaneously. Gait is normal. CNII-XII grossly in tact. Psych:  Responds to questions appropriately with a normal affect.   Labs: Results for orders placed in visit on 06/21/13  POCT CBC      Result Value Range   WBC 11.6 (*) 4.6 - 10.2 K/uL   Lymph, poc 1.8  0.6 - 3.4   POC LYMPH PERCENT 15.5  10 - 50 %L   MID (cbc) 0.8  0 - 0.9   POC MID % 6.7  0 - 12 %M   POC Granulocyte 9.0 (*) 2 - 6.9   Granulocyte percent  77.8  37 - 80 %G   RBC 4.23  4.04 - 5.48 M/uL   Hemoglobin 12.5  12.2 - 16.2 g/dL   HCT, POC 40.9  81.1 - 47.9 %   MCV 94.9  80 - 97 fL   MCH, POC 29.6  27 - 31.2 pg   MCHC 31.2 (*) 31.8 - 35.4 g/dL   RDW, POC 91.4     Platelet Count, POC 231  142 - 424 K/uL   MPV 8.4  0 - 99.8 fL   Culture from 06/19/13: MRSA, sensitive to Bactrim  ASSESSMENT AND PLAN:  62 y.o. female with cellulitis of the left lower extremity  -Start Bactrim DS 1 po bid #20 no RF -Bactroban Nasal 2% Apply one half tube bid for 5 days #1 gram no RF -RTC 72 hours, sooner if needed -Start warm compresses -MRSA education -RTC precautions  Signed, Eula Listen, PA-C 06/21/2013 11:22 AM

## 2013-06-23 ENCOUNTER — Ambulatory Visit (INDEPENDENT_AMBULATORY_CARE_PROVIDER_SITE_OTHER): Payer: BC Managed Care – PPO | Admitting: Physician Assistant

## 2013-06-23 VITALS — BP 116/76 | HR 77 | Temp 97.9°F | Resp 16 | Ht 63.0 in | Wt 153.6 lb

## 2013-06-23 DIAGNOSIS — L0390 Cellulitis, unspecified: Secondary | ICD-10-CM

## 2013-06-23 DIAGNOSIS — B9562 Methicillin resistant Staphylococcus aureus infection as the cause of diseases classified elsewhere: Secondary | ICD-10-CM

## 2013-06-23 DIAGNOSIS — L0291 Cutaneous abscess, unspecified: Secondary | ICD-10-CM

## 2013-06-23 DIAGNOSIS — M25562 Pain in left knee: Secondary | ICD-10-CM

## 2013-06-23 DIAGNOSIS — M25569 Pain in unspecified knee: Secondary | ICD-10-CM

## 2013-06-23 LAB — POCT CBC
Granulocyte percent: 73.9 %G (ref 37–80)
HCT, POC: 40.6 % (ref 37.7–47.9)
Hemoglobin: 13 g/dL (ref 12.2–16.2)
MCH, POC: 30.2 pg (ref 27–31.2)
MCV: 94.1 fL (ref 80–97)
Platelet Count, POC: 247 10*3/uL (ref 142–424)
RBC: 4.31 M/uL (ref 4.04–5.48)
WBC: 9.3 10*3/uL (ref 4.6–10.2)

## 2013-06-23 MED ORDER — HYDROCODONE-ACETAMINOPHEN 5-325 MG PO TABS: 1.0000 | ORAL_TABLET | Freq: Four times a day (QID) | ORAL | Status: DC | PRN

## 2013-06-23 NOTE — Progress Notes (Signed)
   [address], [place] [address]   Phone [phone]  Subjective:    Patient ID: Stephanie Daniel, female    DOB: [birth year], 62 y.o.   MRN: [id number]  HPI Pt presents to clinic for wound recheck.  She has been using warm compresses and elevating foot and feel like the abscess has gotten bigger.  It is definitely more painful.  She feels fine but she is a little nauseated from the abx and shaky this am.  She has noticed no fevers or chills.  Pt has been using the bactroban on her wounds as well as in her nose as she was instructed.   Review of Systems     Objective:   Physical Exam  Vitals reviewed. Constitutional: She appears well-developed and well-nourished.  HENT:  Head: Normocephalic and atraumatic.  Right Ear: External ear normal.  Left Ear: External ear normal.  Eyes: Conjunctivae are normal.  Neck: Normal range of motion.  Pulmonary/Chest: Effort normal.  Skin: Skin is warm and dry.     Psychiatric: She has a normal mood and affect. Her behavior is normal. Judgment and thought content normal.   Procedure: Verbal consent obtained.  Local anesthesia with 2% lido.  Wound unroofed and then I&D performed with #11 blade.  Purulent drainage from the area.  Area packed with 1/4 in plain packing and drsg placed.   Results for orders placed in visit on 06/23/13  POCT CBC      Result Value Range   WBC 9.3  4.6 - 10.2 K/uL   Lymph, poc 1.8  0.6 - 3.4   POC LYMPH PERCENT 19.5  10 - 50 %L   MID (cbc) 0.6  0 - 0.9   POC MID % 6.6  0 - 12 %M   POC Granulocyte 6.9  2 - 6.9   Granulocyte percent 73.9  37 - 80 %G   RBC 4.31  4.04 - 5.48 M/uL   Hemoglobin 13.0  12.2 - 16.2 g/dL   HCT, POC 41.3  24.4 - 47.9 %   MCV 94.1  80 - 97 fL   MCH, POC 30.2  27 - 31.2 pg   MCHC 32.0  31.8 - 35.4 g/dL   RDW, POC 01.0     Platelet Count, POC 247  142 - 424 K/uL   MPV 8.8  0 - 99.8 fL           Assessment & Plan:  MRSA cellulitis - Plan: POCT CBC - Leukocytosis has resolved.  Pt  to continue her oral abx.  She is to continue nasal use of bactroban but to only use topical bactroban on the knee wound.  She is to change the drsg daily.  Pain in joint, lower leg, left - Plan: HYDROcodone-acetaminophen (NORCO/VICODIN) 5-325 MG per tablet  F/u with Eula Listen on Sat for wound recheck.  Benny Lennert PA-C 06/23/2013 1:32 PM

## 2013-06-25 ENCOUNTER — Ambulatory Visit (INDEPENDENT_AMBULATORY_CARE_PROVIDER_SITE_OTHER): Payer: BC Managed Care – PPO | Admitting: Physician Assistant

## 2013-06-25 VITALS — BP 130/74 | HR 83 | Temp 97.8°F | Resp 18 | Ht 63.25 in | Wt 154.0 lb

## 2013-06-25 DIAGNOSIS — L0390 Cellulitis, unspecified: Secondary | ICD-10-CM

## 2013-06-25 DIAGNOSIS — L02419 Cutaneous abscess of limb, unspecified: Secondary | ICD-10-CM

## 2013-06-25 DIAGNOSIS — B9562 Methicillin resistant Staphylococcus aureus infection as the cause of diseases classified elsewhere: Secondary | ICD-10-CM

## 2013-06-25 NOTE — Progress Notes (Signed)
   Patient ID: Stephanie Daniel MRN: [id number], DOB: Aug 22, 1951 62 y.o. Date of Encounter: 06/25/2013, 11:27 AM  Primary Physician: No PCP Per Patient  Chief Complaint: Wound care   See previous note  HPI: 62 y.o. female presents for wound care s/p I&D on 06/23/13 Doing well No issues or complaints Afebrile/ no chills No nausea or vomiting Tolerating Bactrim DS without issue Pain is improving.  Needing Norco bid Changing dressings bid secondary to drainage Previous note reviewed  Past Medical History  Diagnosis Date  . Hypertension   . Thyroid disease   . Arthritis      Home Meds: Prior to Admission medications   Medication Sig Start Date End Date Taking? Authorizing Provider  calcium-vitamin D (OSCAL WITH D) 500-200 MG-UNIT per tablet Take 1 tablet by mouth daily.   Yes Historical Provider, MD  fish oil-omega-3 fatty acids 1000 MG capsule Take 2 g by mouth daily.   Yes Historical Provider, MD  HYDROcodone-acetaminophen (NORCO/VICODIN) 5-325 MG per tablet Take 1 tablet by mouth every 6 (six) hours as needed for pain. 06/23/13  Yes Morrell Riddle, PA-C  levothyroxine (SYNTHROID, LEVOTHROID) 75 MCG tablet Take 75 mcg by mouth daily.   Yes Historical Provider, MD  lisinopril (PRINIVIL,ZESTRIL) 10 MG tablet Take 10 mg by mouth daily.   Yes Historical Provider, MD  meloxicam (MOBIC) 15 MG tablet Take 15 mg by mouth daily.   Yes Historical Provider, MD  mupirocin nasal ointment (BACTROBAN NASAL) 2 % Place into the nose 2 (two) times daily. Use one-half of tube in each nostril twice daily for five (5) days. After application, press sides of nose together and gently massage. 06/21/13  Yes Jonique Kulig M Kaelynne Christley, PA-C  mupirocin ointment (BACTROBAN) 2 % Apply topically 2 (two) times daily. X 10 days 06/19/13  Yes Thao P Le, DO  sulfamethoxazole-trimethoprim (BACTRIM DS) 800-160 MG per tablet Take 1 tablet by mouth 2 (two) times daily. 06/21/13  Yes Hamna Asa M Ashaz Robling, PA-C    Allergies: No Known  Allergies  ROS: Constitutional: Afebrile, no chills Dermatological: Positive for wound, erythema, pain, and warmth  GI: No nausea or vomiting   EXAM: Physical Exam: Blood pressure 130/74, pulse 83, temperature 97.8 F (36.6 C), temperature source Oral, resp. rate 18, height 5' 3.25" (1.607 m), weight 154 lb (69.854 kg), SpO2 98.00%., Body mass index is 27.05 kg/(m^2). General: Well developed, well nourished, in no acute distress. Nontoxic appearing. Head: Normocephalic, atraumatic, sclera non-icteric.  Neck: Supple. Lungs: Breathing is unlabored. Heart: Normal rate. Skin:  Warm and moist. Lesion 1) Scabbed over lesion along the left lateral with erythema or TTP. Lesion 2) Dressing and packing in place along the distal medial aspect of the left leg. Continued induration, erythema, and tenderness to palpation more so along the anterior aspect of the wound. Distal pulses 2+. Cap refill less than 2 seconds.  Neuro: Alert and oriented X 3. Moves all extremities spontaneously. Normal gait.  Psych:  Responds to questions appropriately with a normal affect.   PROCEDURE: Dressing and packing removed. Some purulence expressed Wound debrided to healthy wound bed Irrigated with 1% plain lidocaine 5 cc. Repacked with 1/4 inch plain packing Dressing applied  LAB: Culture: MRSA  A/P: 62 y.o. female with cellulitis/abscess as above s/p I&D on 06/23/13 -Wound care per above -Continue Bactrim DS -Pain improving -Daily dressing changes -Recheck 48 hours with Frances Furbish, PA-C  Signed, Eula Listen, PA-C 06/25/2013 11:27 AM

## 2013-06-27 ENCOUNTER — Ambulatory Visit (INDEPENDENT_AMBULATORY_CARE_PROVIDER_SITE_OTHER): Payer: BC Managed Care – PPO | Admitting: Physician Assistant

## 2013-06-27 VITALS — BP 100/68 | HR 80 | Temp 98.0°F | Resp 16

## 2013-06-27 DIAGNOSIS — B9562 Methicillin resistant Staphylococcus aureus infection as the cause of diseases classified elsewhere: Secondary | ICD-10-CM

## 2013-06-27 DIAGNOSIS — L03119 Cellulitis of unspecified part of limb: Secondary | ICD-10-CM

## 2013-06-27 DIAGNOSIS — L0291 Cutaneous abscess, unspecified: Secondary | ICD-10-CM

## 2013-06-27 DIAGNOSIS — L02419 Cutaneous abscess of limb, unspecified: Secondary | ICD-10-CM

## 2013-06-27 DIAGNOSIS — A4902 Methicillin resistant Staphylococcus aureus infection, unspecified site: Secondary | ICD-10-CM

## 2013-06-27 DIAGNOSIS — L0390 Cellulitis, unspecified: Secondary | ICD-10-CM

## 2013-06-27 NOTE — Progress Notes (Signed)
  Subjective:    Patient ID: SHERA LAUBACH, female    DOB: 08/12/1951, 62 y.o.   MRN: [id number]  HPI   Ms. Bernet is a very pleasant 62 yr old female here for wound care following I&D of abscess of the left lower leg on 06/23/13.  Pt reports she is doing well.  Still quite tender at the site, though this is improved.  Changing dressing once daily.  Taking the abx as directed and tolerating them well.  Denies fever, chills, nausea, vomiting.  Review of Systems  Constitutional: Negative for fever and chills.  HENT: Negative.   Respiratory: Negative.   Cardiovascular: Negative.   Gastrointestinal: Negative.   Musculoskeletal: Negative.   Skin: Positive for wound.  Neurological: Negative.        Objective:   Physical Exam  Vitals reviewed. Constitutional: She is oriented to person, place, and time. She appears well-developed and well-nourished. No distress.  HENT:  Head: Normocephalic and atraumatic.  Eyes: Conjunctivae are normal.  Pulmonary/Chest: Effort normal.  Neurological: She is alert and oriented to person, place, and time.  Skin: Skin is warm and dry.     Healing wound of left medial ankle; wound bed visible and healthy; moderate TTP around wound edges but no erythema, induration, warmth  Psychiatric: She has a normal mood and affect. Her behavior is normal.    Wound Care: Dressing and packing removed.  Unable to express any drainage from the wound.  Wound bed appears healthy.  Irrigated with 5cc 2% plain lidocaine.  Repacked with 1/4" plain packing.  Dressing applied.  Pt tolerated very well.      Assessment & Plan:  Cellulitis and abscess of leg  MRSA cellulitis   Ms. Cirrincione is a very pleasant 62 yr old female here for follow up on an abscess of the left lower leg.  Wound appears to be healing well.  Wound care per above.  Continue abx as directed.  Continue daily dressing changes.  Pt has plans to follow up with Eula Listen PA-C on 06/29/13, fast track card updated.

## 2013-06-29 ENCOUNTER — Ambulatory Visit (INDEPENDENT_AMBULATORY_CARE_PROVIDER_SITE_OTHER): Payer: BC Managed Care – PPO | Admitting: Physician Assistant

## 2013-06-29 ENCOUNTER — Encounter: Payer: Self-pay | Admitting: Physician Assistant

## 2013-06-29 VITALS — BP 110/70 | HR 77 | Temp 97.4°F | Resp 18 | Ht 63.0 in | Wt 152.8 lb

## 2013-06-29 DIAGNOSIS — L0390 Cellulitis, unspecified: Secondary | ICD-10-CM

## 2013-06-29 DIAGNOSIS — L0291 Cutaneous abscess, unspecified: Secondary | ICD-10-CM

## 2013-06-29 MED ORDER — SULFAMETHOXAZOLE-TMP DS 800-160 MG PO TABS: 1.0000 | ORAL_TABLET | Freq: Two times a day (BID) | ORAL | Status: DC

## 2013-06-29 NOTE — Progress Notes (Signed)
   Patient ID: Stephanie Daniel MRN: [id number], DOB: Jun 27, 1951 62 y.o. Date of Encounter: 06/29/2013, 11:28 AM  Primary Physician: No PCP Per Patient  Chief Complaint: Wound care   See previous note  HPI: 62 y.o. female presents for wound care s/p I&D on 06/23/13 Doing well No issues or complaints Afebrile/ no chills No nausea or vomiting Tolerating Bactrim  Pain "nonexistent"  Daily dressing change Previous note reviewed  Past Medical History  Diagnosis Date  . Hypertension   . Thyroid disease   . Arthritis      Home Meds: Prior to Admission medications   Medication Sig Start Date End Date Taking? Authorizing Provider  calcium-vitamin D (OSCAL WITH D) 500-200 MG-UNIT per tablet Take 1 tablet by mouth daily.    Historical Provider, MD  fish oil-omega-3 fatty acids 1000 MG capsule Take 2 g by mouth daily.    Historical Provider, MD  HYDROcodone-acetaminophen (NORCO/VICODIN) 5-325 MG per tablet Take 1 tablet by mouth every 6 (six) hours as needed for pain. 06/23/13   Morrell Riddle, PA-C  levothyroxine (SYNTHROID, LEVOTHROID) 75 MCG tablet Take 75 mcg by mouth daily.    Historical Provider, MD  lisinopril (PRINIVIL,ZESTRIL) 10 MG tablet Take 10 mg by mouth daily.    Historical Provider, MD  meloxicam (MOBIC) 15 MG tablet Take 15 mg by mouth daily.    Historical Provider, MD  mupirocin nasal ointment (BACTROBAN NASAL) 2 % Place into the nose 2 (two) times daily. Use one-half of tube in each nostril twice daily for five (5) days. After application, press sides of nose together and gently massage. 06/21/13   Sondra Barges, PA-C  mupirocin ointment (BACTROBAN) 2 % Apply topically 2 (two) times daily. X 10 days 06/19/13   Thao P Le, DO  sulfamethoxazole-trimethoprim (BACTRIM DS) 800-160 MG per tablet Take 1 tablet by mouth 2 (two) times daily. 06/21/13   Sondra Barges, PA-C    Allergies: No Known Allergies  ROS: Constitutional: Afebrile, no chills Cardiovascular: negative for chest pain or  palpitations Dermatological: Positive for wound. Negative for erythema, pain, or warmth  GI: No nausea or vomiting   EXAM: Physical Exam: Blood pressure 110/70, pulse 77, temperature 97.4 F (36.3 C), temperature source Oral, resp. rate 18, height 5\' 3"  (1.6 m), weight 152 lb 12.8 oz (69.31 kg), SpO2 100.00%., Body mass index is 27.07 kg/(m^2). General: Well developed, well nourished, in no acute distress. Nontoxic appearing. Head: Normocephalic, atraumatic, sclera non-icteric.  Neck: Supple. Lungs: Breathing is unlabored. Heart: Normal rate. Skin:  Warm and moist. Dressing and packing in place. Healing wound of left medial ankle. No induration or erythema, Mild  tenderness to palpation just superior to wound, otherwise no further TTP of wound or wound edges.  Neuro: Alert and oriented X 3. Moves all extremities spontaneously. Normal gait.  Psych:  Responds to questions appropriately with a normal affect.   PROCEDURE: Dressing and packing removed. No purulence expressed Wound bed healthy Irrigated with 1% plain lidocaine 5 cc. Repacked with 1/4 inch plain packing Dressing applied  LAB: Culture: MRSA, sensitive to Bactrim   A/P: 62 y.o. female with improving cellulitis/abscess as above s/p I&D on 06/23/13 -Wound care per above -Continue Bactrim DS -Bactrim DS 1 po bid #28 no RF -Pain well controlled -Daily dressing changes -Recheck 48 hours  Signed, Eula Listen, PA-C 06/29/2013 11:28 AM

## 2013-07-01 ENCOUNTER — Ambulatory Visit (INDEPENDENT_AMBULATORY_CARE_PROVIDER_SITE_OTHER): Payer: BC Managed Care – PPO | Admitting: Physician Assistant

## 2013-07-01 VITALS — BP 122/70 | HR 66 | Temp 97.7°F | Resp 18 | Ht 63.0 in | Wt 152.0 lb

## 2013-07-01 DIAGNOSIS — L02419 Cutaneous abscess of limb, unspecified: Secondary | ICD-10-CM

## 2013-07-01 DIAGNOSIS — L03119 Cellulitis of unspecified part of limb: Secondary | ICD-10-CM

## 2013-07-01 NOTE — Progress Notes (Signed)
   Patient ID: Stephanie Daniel MRN: [id number], DOB: 25-May-1951 62 y.o. Date of Encounter: 07/01/2013, 12:13 PM  Primary Physician: No PCP Per Patient  Chief Complaint: Wound care   See previous note  HPI: 62 y.o. female presents for wound care s/p I&D on 06/23/13 Doing well No issues or complaints Afebrile/ no chills No nausea or vomiting Tolerating Bactrim Pain resolved Daily dressing change Previous note reviewed  Past Medical History  Diagnosis Date  . Hypertension   . Thyroid disease   . Arthritis      Home Meds: Prior to Admission medications   Medication Sig Start Date End Date Taking? Authorizing Provider  calcium-vitamin D (OSCAL WITH D) 500-200 MG-UNIT per tablet Take 1 tablet by mouth daily.   Yes Historical Provider, MD  fish oil-omega-3 fatty acids 1000 MG capsule Take 2 g by mouth daily.   Yes Historical Provider, MD  HYDROcodone-acetaminophen (NORCO/VICODIN) 5-325 MG per tablet Take 1 tablet by mouth every 6 (six) hours as needed for pain. 06/23/13  Yes Morrell Riddle, PA-C  levothyroxine (SYNTHROID, LEVOTHROID) 75 MCG tablet Take 75 mcg by mouth daily.   Yes Historical Provider, MD  lisinopril (PRINIVIL,ZESTRIL) 10 MG tablet Take 10 mg by mouth daily.   Yes Historical Provider, MD  meloxicam (MOBIC) 15 MG tablet Take 15 mg by mouth daily.   Yes Historical Provider, MD  mupirocin nasal ointment (BACTROBAN NASAL) 2 % Place into the nose 2 (two) times daily. Use one-half of tube in each nostril twice daily for five (5) days. After application, press sides of nose together and gently massage. 06/21/13  Yes Nayib Remer M Willey Due, PA-C  mupirocin ointment (BACTROBAN) 2 % Apply topically 2 (two) times daily. X 10 days 06/19/13  Yes Thao P Le, DO  sulfamethoxazole-trimethoprim (BACTRIM DS) 800-160 MG per tablet Take 1 tablet by mouth 2 (two) times daily. 06/29/13  Yes Liah Morr M Marissa Weaver, PA-C    Allergies: No Known Allergies  ROS: Constitutional: Afebrile, no chills Cardiovascular: negative  for chest pain or palpitations Dermatological: Positive for wound. Negative for erythema, pain, or warmth  GI: No nausea or vomiting   EXAM: Physical Exam: Blood pressure 122/70, pulse 66, temperature 97.7 F (36.5 C), temperature source Oral, resp. rate 18, height 5\' 3"  (1.6 m), weight 152 lb (68.947 kg), SpO2 97.00%., Body mass index is 26.93 kg/(m^2). General: Well developed, well nourished, in no acute distress. Nontoxic appearing. Head: Normocephalic, atraumatic, sclera non-icteric.  Neck: Supple. Lungs: Breathing is unlabored. Heart: Normal rate. Skin:  Warm and moist. Dressing and packing in place. No induration, erythema, or tenderness to palpation. TTP along superior aspect has now resolved.  Neuro: Alert and oriented X 3. Moves all extremities spontaneously. Normal gait.  Psych:  Responds to questions appropriately with a normal affect.   PROCEDURE: Dressing and packing removed. No purulence expressed Wound bed healthy Irrigated with 1% plain lidocaine 5 cc. Repacked with small amount of 1/4 plain packing Dressing applied  LAB: Culture: MRSA  A/P: 62 y.o. female with improving cellulitis/abscess as above s/p I&D on 06/23/13 -Wound care per above -Continue Bactrim -Pain well controlled -Daily dressing changes -Recheck 72 hours  Signed, Eula Listen, PA-C 07/01/2013 12:13 PM

## 2013-07-04 ENCOUNTER — Ambulatory Visit (INDEPENDENT_AMBULATORY_CARE_PROVIDER_SITE_OTHER): Payer: BC Managed Care – PPO | Admitting: Physician Assistant

## 2013-07-04 VITALS — BP 126/78 | HR 76 | Temp 98.0°F | Resp 16

## 2013-07-04 DIAGNOSIS — L02419 Cutaneous abscess of limb, unspecified: Secondary | ICD-10-CM

## 2013-07-04 NOTE — Progress Notes (Signed)
   Patient ID: Stephanie Daniel MRN: [id number], DOB: 1951/05/08 62 y.o. Date of Encounter: 07/04/2013, 6:05 PM  Primary Physician: No PCP Per Patient  Chief Complaint: Wound care   See previous note  HPI: 62 y.o. female presents for wound care s/p I&D on 06/23/13 Doing well No issues or complaints Afebrile/ no chills No nausea or vomiting Tolerating Bactrim Pain resolved Daily dressing change Previous note reviewed  Past Medical History  Diagnosis Date  . Hypertension   . Thyroid disease   . Arthritis      Home Meds: Prior to Admission medications   Medication Sig Start Date End Date Taking? Authorizing Provider  calcium-vitamin D (OSCAL WITH D) 500-200 MG-UNIT per tablet Take 1 tablet by mouth daily.   Yes Historical Provider, MD  fish oil-omega-3 fatty acids 1000 MG capsule Take 2 g by mouth daily.   Yes Historical Provider, MD  levothyroxine (SYNTHROID, LEVOTHROID) 75 MCG tablet Take 75 mcg by mouth daily.   Yes Historical Provider, MD  lisinopril (PRINIVIL,ZESTRIL) 10 MG tablet Take 10 mg by mouth daily.   Yes Historical Provider, MD  meloxicam (MOBIC) 15 MG tablet Take 15 mg by mouth daily.   Yes Historical Provider, MD  sulfamethoxazole-trimethoprim (BACTRIM DS) 800-160 MG per tablet Take 1 tablet by mouth 2 (two) times daily. 06/29/13  Yes Arael Piccione M Trudi Morgenthaler, PA-C  HYDROcodone-acetaminophen (NORCO/VICODIN) 5-325 MG per tablet Take 1 tablet by mouth every 6 (six) hours as needed for pain. 06/23/13   Morrell Riddle, PA-C  mupirocin nasal ointment (BACTROBAN NASAL) 2 % Place into the nose 2 (two) times daily. Use one-half of tube in each nostril twice daily for five (5) days. After application, press sides of nose together and gently massage. 06/21/13   Sondra Barges, PA-C  mupirocin ointment (BACTROBAN) 2 % Apply topically 2 (two) times daily. X 10 days 06/19/13   Thao P Le, DO    Allergies: No Known Allergies  ROS: Constitutional: Afebrile, no chills Cardiovascular: negative for chest  pain or palpitations Dermatological: Positive for wound. Negative for erythema, pain, or warmth  GI: No nausea or vomiting   EXAM: Physical Exam: Blood pressure 126/78, pulse 76, temperature 98 F (36.7 C), temperature source Oral, resp. rate 16, SpO2 98.00%., There is no weight on file to calculate BMI. General: Well developed, well nourished, in no acute distress. Nontoxic appearing. Head: Normocephalic, atraumatic, sclera non-icteric.  Neck: Supple. Lungs: Breathing is unlabored. Heart: Normal rate. Skin:  Warm and moist. Dressing and packing in place. No induration, erythema, or tenderness to palpation. Neuro: Alert and oriented X 3. Moves all extremities spontaneously. Normal gait.  Psych:  Responds to questions appropriately with a normal affect.   PROCEDURE: Dressing and packing removed. No purulence expressed Wound bed healthy granulation tissue Irrigated with 1% plain lidocaine 5 cc. Repacked with single layer of 1/4 inch plain packing Dressing applied  LAB: Culture: MRSA  A/P: 62 y.o. female with resolveing cellulitis/abscess as above s/p I&D on 06/23/13 -Wound care per above -Continue Bactrim -Pain well controlled -Daily dressing changes -Recheck 72 hours  Signed, Eula Listen, PA-C 07/04/2013 6:05 PM

## 2013-07-07 ENCOUNTER — Ambulatory Visit (INDEPENDENT_AMBULATORY_CARE_PROVIDER_SITE_OTHER): Payer: BC Managed Care – PPO | Admitting: Physician Assistant

## 2013-07-07 VITALS — BP 98/60 | HR 85 | Temp 97.9°F | Resp 16 | Ht 63.5 in | Wt 154.0 lb

## 2013-07-07 DIAGNOSIS — L0390 Cellulitis, unspecified: Secondary | ICD-10-CM

## 2013-07-07 DIAGNOSIS — L0291 Cutaneous abscess, unspecified: Secondary | ICD-10-CM

## 2013-07-07 MED ORDER — SULFAMETHOXAZOLE-TMP DS 800-160 MG PO TABS: 1.0000 | ORAL_TABLET | Freq: Two times a day (BID) | ORAL | Status: DC

## 2013-07-07 NOTE — Progress Notes (Signed)
   Patient ID: Stephanie Daniel MRN: [id number], DOB: 1950-11-26 62 y.o. Date of Encounter: 07/07/2013, 12:34 PM  Primary Physician: No PCP Per Patient  Chief Complaint: Wound care   See previous note  HPI: 62 y.o. female presents for wound care s/p I&D on 06/23/13 Doing well No issues or complaints Afebrile/ no chills No nausea or vomiting Tolerating Bactrim DS Pain resolved Daily dressing change Previous note reviewed  Past Medical History  Diagnosis Date  . Hypertension   . Thyroid disease   . Arthritis      Home Meds: Prior to Admission medications   Medication Sig Start Date End Date Taking? Authorizing Provider  calcium-vitamin D (OSCAL WITH D) 500-200 MG-UNIT per tablet Take 1 tablet by mouth daily.    Historical Provider, MD  fish oil-omega-3 fatty acids 1000 MG capsule Take 2 g by mouth daily.    Historical Provider, MD  HYDROcodone-acetaminophen (NORCO/VICODIN) 5-325 MG per tablet Take 1 tablet by mouth every 6 (six) hours as needed for pain. 06/23/13   Morrell Riddle, PA-C  levothyroxine (SYNTHROID, LEVOTHROID) 75 MCG tablet Take 75 mcg by mouth daily.    Historical Provider, MD  lisinopril (PRINIVIL,ZESTRIL) 10 MG tablet Take 10 mg by mouth daily.    Historical Provider, MD  meloxicam (MOBIC) 15 MG tablet Take 15 mg by mouth daily.    Historical Provider, MD  mupirocin nasal ointment (BACTROBAN NASAL) 2 % Place into the nose 2 (two) times daily. Use one-half of tube in each nostril twice daily for five (5) days. After application, press sides of nose together and gently massage. 06/21/13   Sondra Barges, PA-C  mupirocin ointment (BACTROBAN) 2 % Apply topically 2 (two) times daily. X 10 days 06/19/13   Thao P Le, DO  sulfamethoxazole-trimethoprim (BACTRIM DS) 800-160 MG per tablet Take 1 tablet by mouth 2 (two) times daily. 06/29/13   Sondra Barges, PA-C    Allergies: No Known Allergies  ROS: Constitutional: Afebrile, no chills Cardiovascular: negative for chest pain or  palpitations Dermatological: Positive for wound. Negative for erythema, pain, or warmth  GI: No nausea or vomiting   EXAM: Physical Exam: Blood pressure 98/60, pulse 85, temperature 97.9 F (36.6 C), temperature source Oral, resp. rate 16, height 5' 3.5" (1.613 m), weight 154 lb (69.854 kg), SpO2 97.00%., Body mass index is 26.85 kg/(m^2). General: Well developed, well nourished, in no acute distress. Nontoxic appearing. Head: Normocephalic, atraumatic, sclera non-icteric.  Neck: Supple. Lungs: Breathing is unlabored. Heart: Normal rate. Skin:  Warm and moist. Dressing and packing in place. No induration, erythema, or tenderness to palpation. Neuro: Alert and oriented X 3. Moves all extremities spontaneously. Normal gait.  Psych:  Responds to questions appropriately with a normal affect.   PROCEDURE: Dressing and packing removed. No purulence expressed Wound bed healthy overall Small amount of drainage adhered to inferior wound wall debrided Irrigated with 1% plain lidocaine 5 cc. Small amount of 1/4 inch plain packing laid into wound Dressing applied  LAB: Culture: MRSA  A/P: 62 y.o. female with resolving cellulitis/abscess as above s/p I&D on 06/23/13 -Wound care per above -Continue Bactrim -Bactrim DS 1 po bid #28 no RF, start if develops another infection -Pain resolved -Daily dressing changes -Recheck 48 hours, likely last OV  SignedEula Listen, PA-C 07/07/2013 12:34 PM

## 2013-07-09 ENCOUNTER — Ambulatory Visit (INDEPENDENT_AMBULATORY_CARE_PROVIDER_SITE_OTHER): Payer: BC Managed Care – PPO | Admitting: Physician Assistant

## 2013-07-09 VITALS — BP 109/65 | HR 80 | Temp 97.7°F | Resp 18 | Wt 153.0 lb

## 2013-07-09 DIAGNOSIS — B9562 Methicillin resistant Staphylococcus aureus infection as the cause of diseases classified elsewhere: Secondary | ICD-10-CM

## 2013-07-09 DIAGNOSIS — L0291 Cutaneous abscess, unspecified: Secondary | ICD-10-CM

## 2013-07-09 NOTE — Progress Notes (Signed)
   [address], [place] [address]   Phone [phone]  Subjective:    Patient ID: Stephanie Daniel, female    DOB: 02/28/1951, 62 y.o.   MRN: [id number]  HPI  Pt presents for recheck since her I&D 06/23/2013.  The wound has gotten better.  She is changing the drsg daily.  Review of Systems     Objective:   Physical Exam  Constitutional: She is oriented to person, place, and time. She appears well-developed and well-nourished.  HENT:  Head: Normocephalic and atraumatic.  Right Ear: External ear normal.  Left Ear: External ear normal.  Neurological: She is alert and oriented to person, place, and time.  Skin: Skin is warm and dry.  Drsg and packing removed.  Wound bed is healthy.  No surrounding erythema, some mild induration but no TTP.  No purulence expressed.  Wound repacked with 1/4in plain packing - drsg placed.  Psychiatric: She has a normal mood and affect. Her behavior is normal. Judgment and thought content normal.       Assessment & Plan:  MRSA cellulitis - leg - improved - continue daily drsg changes - if packing has not fallen out in 4 days please pull out.  F/u only if needed.  Benny Lennert PA-C 07/09/2013 1:30 PM

## 2013-09-22 ENCOUNTER — Other Ambulatory Visit: Payer: Self-pay

## 2013-12-01 ENCOUNTER — Other Ambulatory Visit: Payer: Self-pay | Admitting: Dermatology

## 2013-12-20 ENCOUNTER — Other Ambulatory Visit: Payer: Self-pay | Admitting: Dermatology

## 2013-12-20 DIAGNOSIS — D485 Neoplasm of uncertain behavior of skin: Secondary | ICD-10-CM | POA: Diagnosis not present

## 2013-12-20 DIAGNOSIS — Z85820 Personal history of malignant melanoma of skin: Secondary | ICD-10-CM | POA: Diagnosis not present

## 2014-01-02 ENCOUNTER — Ambulatory Visit (INDEPENDENT_AMBULATORY_CARE_PROVIDER_SITE_OTHER): Payer: Medicare Other | Admitting: Emergency Medicine

## 2014-01-02 VITALS — BP 162/80 | HR 71 | Temp 97.7°F | Resp 18 | Ht 63.0 in | Wt 160.0 lb

## 2014-01-02 DIAGNOSIS — J018 Other acute sinusitis: Secondary | ICD-10-CM

## 2014-01-02 DIAGNOSIS — I10 Essential (primary) hypertension: Secondary | ICD-10-CM | POA: Insufficient documentation

## 2014-01-02 DIAGNOSIS — J209 Acute bronchitis, unspecified: Secondary | ICD-10-CM | POA: Diagnosis not present

## 2014-01-02 MED ORDER — PROMETHAZINE-CODEINE 6.25-10 MG/5ML PO SYRP: 5.0000 mL | ORAL_SOLUTION | Freq: Four times a day (QID) | ORAL | Status: DC | PRN

## 2014-01-02 MED ORDER — AMOXICILLIN-POT CLAVULANATE 875-125 MG PO TABS: 1.0000 | ORAL_TABLET | Freq: Two times a day (BID) | ORAL | Status: DC

## 2014-01-02 NOTE — Addendum Note (Signed)
Addended by: Ivor Reining on: 01/02/2014 03:20 PM   Modules accepted: Orders

## 2014-01-02 NOTE — Patient Instructions (Signed)

## 2014-01-02 NOTE — Progress Notes (Signed)
[facility] [address] [place] [address] [phone]  Date:  01/02/2014   Name:  Stephanie Daniel   DOB:  07-25-1951   MRN:  [id number]  PCP:  No PCP Per Patient    Chief Complaint: Sinusitis   History of Present Illness:  Stephanie Daniel is a 63 y.o. very pleasant female patient who presents with the following:  Ill since last midweek with purulent nasal drainage and pressure in the cheeks.  Has post nasal drainage and a nonproductive cough.  No wheezing or shortness of breath.  No nausea or vomiting.  No headache, no chills.  Had a fever last night.  No improvement with over the counter medications or other home remedies. Denies other complaint or health concern today.   There are no active problems to display for this patient.   Past Medical History  Diagnosis Date  . Hypertension   . Thyroid disease   . Arthritis     Past Surgical History  Procedure Laterality Date  . Knee surgery  2013  . Abdominal hysterectomy    . Appendectomy      History  Substance Use Topics  . Smoking status: Former Research scientist (life sciences)  . Smokeless tobacco: Never Used  . Alcohol Use: Not on file    Family History  Problem Relation Age of Onset  . COPD Mother   . Heart disease Mother   . Heart disease Father   . Cancer Father   . COPD Brother   . Heart disease Brother   . Hypertension Son   . Liver disease Son     No Known Allergies  Medication list has been reviewed and updated.  Current Outpatient Prescriptions on File Prior to Visit  Medication Sig Dispense Refill  . calcium-vitamin D (OSCAL WITH D) 500-200 MG-UNIT per tablet Take 1 tablet by mouth daily.      . fish oil-omega-3 fatty acids 1000 MG capsule Take 2 g by mouth daily.      Marland Kitchen levothyroxine (SYNTHROID, LEVOTHROID) 75 MCG tablet Take 75 mcg by mouth daily.      Marland Kitchen lisinopril (PRINIVIL,ZESTRIL) 10 MG tablet Take 10 mg by mouth daily.      . meloxicam (MOBIC) 15 MG tablet Take 15 mg by mouth daily.        No current facility-administered medications on file prior to visit.    Review of Systems:  As per HPI, otherwise negative.    Physical Examination: Filed Vitals:   01/02/14 1347  BP: 162/80  Pulse: 71  Temp: 97.7 F (36.5 C)  Resp: 18   Filed Vitals:   01/02/14 1347  Height: 5\' 3"  (1.6 m)  Weight: 160 lb (72.576 kg)   Body mass index is 28.35 kg/(m^2). Ideal Body Weight: Weight in (lb) to have BMI = 25: 140.8  GEN: WDWN, NAD, Non-toxic, A & O x 3 HEENT: Atraumatic, Normocephalic. Neck supple. No masses, No LAD. Ears and Nose: No external deformity. CV: RRR, No M/G/R. No JVD. No thrill. No extra heart sounds. PULM: CTA B, no wheezes, crackles, rhonchi. No retractions. No resp. distress. No accessory muscle use. ABD: S, NT, ND, +BS. No rebound. No HSM. EXTR: No c/c/e NEURO Normal gait.  PSYCH: Normally interactive. Conversant. Not depressed or anxious appearing.  Calm demeanor.    Assessment and Plan: Hypertension Sinusitis Bronchitis augmentin mucinex hbp Phen c cod   Signed,  Ellison Carwin, MD

## 2014-01-18 DIAGNOSIS — J45909 Unspecified asthma, uncomplicated: Secondary | ICD-10-CM | POA: Diagnosis not present

## 2014-01-18 DIAGNOSIS — J329 Chronic sinusitis, unspecified: Secondary | ICD-10-CM | POA: Diagnosis not present

## 2014-03-29 DIAGNOSIS — E039 Hypothyroidism, unspecified: Secondary | ICD-10-CM | POA: Diagnosis not present

## 2014-04-21 DIAGNOSIS — M545 Low back pain, unspecified: Secondary | ICD-10-CM | POA: Diagnosis not present

## 2014-05-12 DIAGNOSIS — E663 Overweight: Secondary | ICD-10-CM | POA: Diagnosis not present

## 2014-05-12 DIAGNOSIS — N951 Menopausal and female climacteric states: Secondary | ICD-10-CM | POA: Diagnosis not present

## 2014-05-12 DIAGNOSIS — N183 Chronic kidney disease, stage 3 unspecified: Secondary | ICD-10-CM | POA: Diagnosis not present

## 2014-05-12 DIAGNOSIS — F411 Generalized anxiety disorder: Secondary | ICD-10-CM | POA: Diagnosis not present

## 2014-05-12 DIAGNOSIS — G43909 Migraine, unspecified, not intractable, without status migrainosus: Secondary | ICD-10-CM | POA: Diagnosis not present

## 2014-05-12 DIAGNOSIS — A6000 Herpesviral infection of urogenital system, unspecified: Secondary | ICD-10-CM | POA: Diagnosis not present

## 2014-05-12 DIAGNOSIS — I10 Essential (primary) hypertension: Secondary | ICD-10-CM | POA: Diagnosis not present

## 2014-05-12 DIAGNOSIS — E039 Hypothyroidism, unspecified: Secondary | ICD-10-CM | POA: Diagnosis not present

## 2014-06-16 DIAGNOSIS — G43909 Migraine, unspecified, not intractable, without status migrainosus: Secondary | ICD-10-CM | POA: Diagnosis not present

## 2014-06-16 DIAGNOSIS — E663 Overweight: Secondary | ICD-10-CM | POA: Diagnosis not present

## 2014-06-16 DIAGNOSIS — I10 Essential (primary) hypertension: Secondary | ICD-10-CM | POA: Diagnosis not present

## 2014-07-10 DIAGNOSIS — Z23 Encounter for immunization: Secondary | ICD-10-CM | POA: Diagnosis not present

## 2014-09-01 ENCOUNTER — Other Ambulatory Visit: Payer: Self-pay

## 2014-09-27 DIAGNOSIS — E039 Hypothyroidism, unspecified: Secondary | ICD-10-CM | POA: Diagnosis not present

## 2014-10-18 DIAGNOSIS — H903 Sensorineural hearing loss, bilateral: Secondary | ICD-10-CM | POA: Diagnosis not present

## 2014-10-27 DIAGNOSIS — N951 Menopausal and female climacteric states: Secondary | ICD-10-CM | POA: Diagnosis not present

## 2014-10-27 DIAGNOSIS — A609 Anogenital herpesviral infection, unspecified: Secondary | ICD-10-CM | POA: Diagnosis not present

## 2014-10-27 DIAGNOSIS — J45909 Unspecified asthma, uncomplicated: Secondary | ICD-10-CM | POA: Diagnosis not present

## 2014-10-27 DIAGNOSIS — E049 Nontoxic goiter, unspecified: Secondary | ICD-10-CM | POA: Diagnosis not present

## 2014-10-27 DIAGNOSIS — Z23 Encounter for immunization: Secondary | ICD-10-CM | POA: Diagnosis not present

## 2014-10-27 DIAGNOSIS — Z0000 Encounter for general adult medical examination without abnormal findings: Secondary | ICD-10-CM | POA: Diagnosis not present

## 2014-10-27 DIAGNOSIS — I129 Hypertensive chronic kidney disease with stage 1 through stage 4 chronic kidney disease, or unspecified chronic kidney disease: Secondary | ICD-10-CM | POA: Diagnosis not present

## 2014-10-27 DIAGNOSIS — E039 Hypothyroidism, unspecified: Secondary | ICD-10-CM | POA: Diagnosis not present

## 2014-10-27 DIAGNOSIS — N183 Chronic kidney disease, stage 3 (moderate): Secondary | ICD-10-CM | POA: Diagnosis not present

## 2014-11-24 DIAGNOSIS — Z85820 Personal history of malignant melanoma of skin: Secondary | ICD-10-CM | POA: Diagnosis not present

## 2014-11-24 DIAGNOSIS — D225 Melanocytic nevi of trunk: Secondary | ICD-10-CM | POA: Diagnosis not present

## 2014-11-24 DIAGNOSIS — D1801 Hemangioma of skin and subcutaneous tissue: Secondary | ICD-10-CM | POA: Diagnosis not present

## 2014-11-24 DIAGNOSIS — L821 Other seborrheic keratosis: Secondary | ICD-10-CM | POA: Diagnosis not present

## 2014-11-24 DIAGNOSIS — L814 Other melanin hyperpigmentation: Secondary | ICD-10-CM | POA: Diagnosis not present

## 2015-01-04 DIAGNOSIS — Z1231 Encounter for screening mammogram for malignant neoplasm of breast: Secondary | ICD-10-CM | POA: Diagnosis not present

## 2015-03-06 DIAGNOSIS — M25562 Pain in left knee: Secondary | ICD-10-CM | POA: Diagnosis not present

## 2015-04-06 DIAGNOSIS — E039 Hypothyroidism, unspecified: Secondary | ICD-10-CM | POA: Diagnosis not present

## 2015-04-26 DIAGNOSIS — E049 Nontoxic goiter, unspecified: Secondary | ICD-10-CM | POA: Diagnosis not present

## 2015-04-26 DIAGNOSIS — N183 Chronic kidney disease, stage 3 (moderate): Secondary | ICD-10-CM | POA: Diagnosis not present

## 2015-04-26 DIAGNOSIS — F4320 Adjustment disorder, unspecified: Secondary | ICD-10-CM | POA: Diagnosis not present

## 2015-04-26 DIAGNOSIS — I129 Hypertensive chronic kidney disease with stage 1 through stage 4 chronic kidney disease, or unspecified chronic kidney disease: Secondary | ICD-10-CM | POA: Diagnosis not present

## 2015-04-26 DIAGNOSIS — E039 Hypothyroidism, unspecified: Secondary | ICD-10-CM | POA: Diagnosis not present

## 2015-04-26 DIAGNOSIS — G43909 Migraine, unspecified, not intractable, without status migrainosus: Secondary | ICD-10-CM | POA: Diagnosis not present

## 2015-05-14 ENCOUNTER — Other Ambulatory Visit: Payer: Self-pay

## 2015-05-25 DIAGNOSIS — J329 Chronic sinusitis, unspecified: Secondary | ICD-10-CM | POA: Diagnosis not present

## 2015-05-30 DIAGNOSIS — S93602A Unspecified sprain of left foot, initial encounter: Secondary | ICD-10-CM | POA: Diagnosis not present

## 2015-08-22 DIAGNOSIS — J019 Acute sinusitis, unspecified: Secondary | ICD-10-CM | POA: Diagnosis not present

## 2015-08-29 DIAGNOSIS — Z23 Encounter for immunization: Secondary | ICD-10-CM | POA: Diagnosis not present

## 2015-10-24 DIAGNOSIS — H25813 Combined forms of age-related cataract, bilateral: Secondary | ICD-10-CM | POA: Diagnosis not present

## 2015-10-24 DIAGNOSIS — H52221 Regular astigmatism, right eye: Secondary | ICD-10-CM | POA: Diagnosis not present

## 2015-10-24 DIAGNOSIS — H5201 Hypermetropia, right eye: Secondary | ICD-10-CM | POA: Diagnosis not present

## 2015-10-31 DIAGNOSIS — F39 Unspecified mood [affective] disorder: Secondary | ICD-10-CM | POA: Diagnosis not present

## 2015-10-31 DIAGNOSIS — E049 Nontoxic goiter, unspecified: Secondary | ICD-10-CM | POA: Diagnosis not present

## 2015-10-31 DIAGNOSIS — G43909 Migraine, unspecified, not intractable, without status migrainosus: Secondary | ICD-10-CM | POA: Diagnosis not present

## 2015-10-31 DIAGNOSIS — Z0000 Encounter for general adult medical examination without abnormal findings: Secondary | ICD-10-CM | POA: Diagnosis not present

## 2015-10-31 DIAGNOSIS — E663 Overweight: Secondary | ICD-10-CM | POA: Diagnosis not present

## 2015-10-31 DIAGNOSIS — I129 Hypertensive chronic kidney disease with stage 1 through stage 4 chronic kidney disease, or unspecified chronic kidney disease: Secondary | ICD-10-CM | POA: Diagnosis not present

## 2015-10-31 DIAGNOSIS — Z23 Encounter for immunization: Secondary | ICD-10-CM | POA: Diagnosis not present

## 2015-10-31 DIAGNOSIS — A609 Anogenital herpesviral infection, unspecified: Secondary | ICD-10-CM | POA: Diagnosis not present

## 2015-10-31 DIAGNOSIS — E039 Hypothyroidism, unspecified: Secondary | ICD-10-CM | POA: Diagnosis not present

## 2015-10-31 DIAGNOSIS — N183 Chronic kidney disease, stage 3 (moderate): Secondary | ICD-10-CM | POA: Diagnosis not present

## 2015-10-31 DIAGNOSIS — N951 Menopausal and female climacteric states: Secondary | ICD-10-CM | POA: Diagnosis not present

## 2015-10-31 DIAGNOSIS — J45909 Unspecified asthma, uncomplicated: Secondary | ICD-10-CM | POA: Diagnosis not present

## 2015-11-05 DIAGNOSIS — M25562 Pain in left knee: Secondary | ICD-10-CM | POA: Diagnosis not present

## 2015-11-23 DIAGNOSIS — N390 Urinary tract infection, site not specified: Secondary | ICD-10-CM | POA: Diagnosis not present

## 2015-11-28 DIAGNOSIS — L570 Actinic keratosis: Secondary | ICD-10-CM | POA: Diagnosis not present

## 2015-11-28 DIAGNOSIS — L821 Other seborrheic keratosis: Secondary | ICD-10-CM | POA: Diagnosis not present

## 2015-11-28 DIAGNOSIS — D2261 Melanocytic nevi of right upper limb, including shoulder: Secondary | ICD-10-CM | POA: Diagnosis not present

## 2015-11-28 DIAGNOSIS — L820 Inflamed seborrheic keratosis: Secondary | ICD-10-CM | POA: Diagnosis not present

## 2015-11-28 DIAGNOSIS — D2271 Melanocytic nevi of right lower limb, including hip: Secondary | ICD-10-CM | POA: Diagnosis not present

## 2015-11-28 DIAGNOSIS — D485 Neoplasm of uncertain behavior of skin: Secondary | ICD-10-CM | POA: Diagnosis not present

## 2015-11-28 DIAGNOSIS — Z85820 Personal history of malignant melanoma of skin: Secondary | ICD-10-CM | POA: Diagnosis not present

## 2015-11-28 DIAGNOSIS — L814 Other melanin hyperpigmentation: Secondary | ICD-10-CM | POA: Diagnosis not present

## 2015-12-12 DIAGNOSIS — M1712 Unilateral primary osteoarthritis, left knee: Secondary | ICD-10-CM | POA: Diagnosis not present

## 2015-12-19 DIAGNOSIS — M1712 Unilateral primary osteoarthritis, left knee: Secondary | ICD-10-CM | POA: Diagnosis not present

## 2015-12-27 DIAGNOSIS — M1712 Unilateral primary osteoarthritis, left knee: Secondary | ICD-10-CM | POA: Diagnosis not present

## 2016-01-07 DIAGNOSIS — Z1231 Encounter for screening mammogram for malignant neoplasm of breast: Secondary | ICD-10-CM | POA: Diagnosis not present

## 2016-03-14 DIAGNOSIS — S8262XA Displaced fracture of lateral malleolus of left fibula, initial encounter for closed fracture: Secondary | ICD-10-CM | POA: Diagnosis not present

## 2016-03-17 DIAGNOSIS — S8265XA Nondisplaced fracture of lateral malleolus of left fibula, initial encounter for closed fracture: Secondary | ICD-10-CM | POA: Diagnosis not present

## 2016-03-26 DIAGNOSIS — M1712 Unilateral primary osteoarthritis, left knee: Secondary | ICD-10-CM | POA: Diagnosis not present

## 2016-04-01 DIAGNOSIS — S99812D Other specified injuries of left ankle, subsequent encounter: Secondary | ICD-10-CM | POA: Diagnosis not present

## 2016-04-22 DIAGNOSIS — S8265XD Nondisplaced fracture of lateral malleolus of left fibula, subsequent encounter for closed fracture with routine healing: Secondary | ICD-10-CM | POA: Diagnosis not present

## 2016-06-12 DIAGNOSIS — E039 Hypothyroidism, unspecified: Secondary | ICD-10-CM | POA: Diagnosis not present

## 2016-07-09 DIAGNOSIS — M1712 Unilateral primary osteoarthritis, left knee: Secondary | ICD-10-CM | POA: Diagnosis not present

## 2016-08-04 DIAGNOSIS — Z23 Encounter for immunization: Secondary | ICD-10-CM | POA: Diagnosis not present

## 2016-11-11 DIAGNOSIS — M1712 Unilateral primary osteoarthritis, left knee: Secondary | ICD-10-CM | POA: Diagnosis not present

## 2016-11-14 DIAGNOSIS — J019 Acute sinusitis, unspecified: Secondary | ICD-10-CM | POA: Diagnosis not present

## 2016-11-27 DIAGNOSIS — L814 Other melanin hyperpigmentation: Secondary | ICD-10-CM | POA: Diagnosis not present

## 2016-11-27 DIAGNOSIS — D485 Neoplasm of uncertain behavior of skin: Secondary | ICD-10-CM | POA: Diagnosis not present

## 2016-11-27 DIAGNOSIS — Z85820 Personal history of malignant melanoma of skin: Secondary | ICD-10-CM | POA: Diagnosis not present

## 2016-11-27 DIAGNOSIS — D692 Other nonthrombocytopenic purpura: Secondary | ICD-10-CM | POA: Diagnosis not present

## 2016-11-27 DIAGNOSIS — L821 Other seborrheic keratosis: Secondary | ICD-10-CM | POA: Diagnosis not present

## 2016-12-01 DIAGNOSIS — N183 Chronic kidney disease, stage 3 (moderate): Secondary | ICD-10-CM | POA: Diagnosis not present

## 2016-12-01 DIAGNOSIS — Z0000 Encounter for general adult medical examination without abnormal findings: Secondary | ICD-10-CM | POA: Diagnosis not present

## 2016-12-01 DIAGNOSIS — E039 Hypothyroidism, unspecified: Secondary | ICD-10-CM | POA: Diagnosis not present

## 2016-12-01 DIAGNOSIS — F39 Unspecified mood [affective] disorder: Secondary | ICD-10-CM | POA: Diagnosis not present

## 2016-12-01 DIAGNOSIS — J45909 Unspecified asthma, uncomplicated: Secondary | ICD-10-CM | POA: Diagnosis not present

## 2016-12-01 DIAGNOSIS — I129 Hypertensive chronic kidney disease with stage 1 through stage 4 chronic kidney disease, or unspecified chronic kidney disease: Secondary | ICD-10-CM | POA: Diagnosis not present

## 2016-12-01 DIAGNOSIS — E663 Overweight: Secondary | ICD-10-CM | POA: Diagnosis not present

## 2016-12-01 DIAGNOSIS — A609 Anogenital herpesviral infection, unspecified: Secondary | ICD-10-CM | POA: Diagnosis not present

## 2016-12-01 DIAGNOSIS — E049 Nontoxic goiter, unspecified: Secondary | ICD-10-CM | POA: Diagnosis not present

## 2016-12-01 DIAGNOSIS — G43909 Migraine, unspecified, not intractable, without status migrainosus: Secondary | ICD-10-CM | POA: Diagnosis not present

## 2016-12-01 DIAGNOSIS — N951 Menopausal and female climacteric states: Secondary | ICD-10-CM | POA: Diagnosis not present

## 2017-01-13 DIAGNOSIS — M8588 Other specified disorders of bone density and structure, other site: Secondary | ICD-10-CM | POA: Diagnosis not present

## 2017-01-13 DIAGNOSIS — Z780 Asymptomatic menopausal state: Secondary | ICD-10-CM | POA: Diagnosis not present

## 2017-01-16 DIAGNOSIS — Z1231 Encounter for screening mammogram for malignant neoplasm of breast: Secondary | ICD-10-CM | POA: Diagnosis not present

## 2017-02-10 DIAGNOSIS — M1712 Unilateral primary osteoarthritis, left knee: Secondary | ICD-10-CM | POA: Diagnosis not present

## 2017-03-11 DIAGNOSIS — M1712 Unilateral primary osteoarthritis, left knee: Secondary | ICD-10-CM | POA: Diagnosis not present

## 2017-03-11 DIAGNOSIS — M25562 Pain in left knee: Secondary | ICD-10-CM | POA: Diagnosis not present

## 2017-03-27 ENCOUNTER — Encounter (HOSPITAL_COMMUNITY): Payer: Self-pay | Admitting: *Deleted

## 2017-03-31 DIAGNOSIS — M1712 Unilateral primary osteoarthritis, left knee: Secondary | ICD-10-CM | POA: Diagnosis not present

## 2017-04-27 NOTE — Progress Notes (Signed)
12-01-16 Surgical clearance on chart from Dr. Laurann Montana.

## 2017-04-27 NOTE — Patient Instructions (Addendum)
Stephanie Daniel  04/27/2017   Your procedure is scheduled on: 05-04-17   Report to [facility] Main  Entrance Report to admitting at 7:30 AM   Call this number if you have problems the morning of surgery  [phone]   Remember: ONLY 1 PERSON MAY GO WITH YOU TO SHORT STAY TO GET  READY MORNING OF YOUR SURGERY.  Do not eat food or drink liquids :After Midnight.     Take these medicines the morning of surgery with A SIP OF WATER: Levothyroxine (Synthroid) and Omeprazole (Prilosec)                                You may not have any metal on your body including hair pins and              piercings  Do not wear jewelry, make-up, lotions, powders or perfumes, deodorant             Do not wear nail polish.  Do not shave  48 hours prior to surgery.          Do not bring valuables to the hospital. Stephanie Daniel.  Contacts, dentures or bridgework may not be worn into surgery.  Leave suitcase in the car. After surgery it may be brought to your room.                  Please read over the following fact sheets you were given: _____________________________________________________________________             [facility] - Preparing for Surgery Before surgery, you can play an important role.  Because skin is not sterile, your skin needs to be as free of germs as possible.  You can reduce the number of germs on your skin by washing with CHG (chlorahexidine gluconate) soap before surgery.  CHG is an antiseptic cleaner which kills germs and bonds with the skin to continue killing germs even after washing. Please DO NOT use if you have an allergy to CHG or antibacterial soaps.  If your skin becomes reddened/irritated stop using the CHG and inform your nurse when you arrive at Short Stay. Do not shave (including legs and underarms) for at least 48 hours prior to the first CHG shower.  You may shave your face/neck. Please follow  these instructions carefully:  1.  Shower with CHG Soap the night before surgery and the  morning of Surgery.  2.  If you choose to wash your hair, wash your hair first as usual with your  normal  shampoo.  3.  After you shampoo, rinse your hair and body thoroughly to remove the  shampoo.                           4.  Use CHG as you would any other liquid soap.  You can apply chg directly  to the skin and wash                       Gently with a scrungie or clean washcloth.  5.  Apply the CHG Soap to your body ONLY FROM THE NECK DOWN.   Do not use on  face/ open                           Wound or open sores. Avoid contact with eyes, ears mouth and genitals (private parts).                       Wash face,  Genitals (private parts) with your normal soap.             6.  Wash thoroughly, paying special attention to the area where your surgery  will be performed.  7.  Thoroughly rinse your body with warm water from the neck down.  8.  DO NOT shower/wash with your normal soap after using and rinsing off  the CHG Soap.                9.  Pat yourself dry with a clean towel.            10.  Wear clean pajamas.            11.  Place clean sheets on your bed the night of your first shower and do not  sleep with pets. Day of Surgery : Do not apply any lotions/deodorants the morning of surgery.  Please wear clean clothes to the hospital/surgery center.  FAILURE TO FOLLOW THESE INSTRUCTIONS MAY RESULT IN THE CANCELLATION OF YOUR SURGERY PATIENT SIGNATURE_________________________________  NURSE SIGNATURE__________________________________  ________________________________________________________________________   Stephanie Daniel  An incentive spirometer is a tool that can help keep your lungs clear and active. This tool measures how well you are filling your lungs with each breath. Taking long deep breaths may help reverse or decrease the chance of developing breathing (pulmonary) problems  (especially infection) following:  A long period of time when you are unable to move or be active. BEFORE THE PROCEDURE   If the spirometer includes an indicator to show your best effort, your nurse or respiratory therapist will set it to a desired goal.  If possible, sit up straight or lean slightly forward. Try not to slouch.  Hold the incentive spirometer in an upright position. INSTRUCTIONS FOR USE  1. Sit on the edge of your bed if possible, or sit up as far as you can in bed or on a chair. 2. Hold the incentive spirometer in an upright position. 3. Breathe out normally. 4. Place the mouthpiece in your mouth and seal your lips tightly around it. 5. Breathe in slowly and as deeply as possible, raising the piston or the ball toward the top of the column. 6. Hold your breath for 3-5 seconds or for as long as possible. Allow the piston or ball to fall to the bottom of the column. 7. Remove the mouthpiece from your mouth and breathe out normally. 8. Rest for a few seconds and repeat Steps 1 through 7 at least 10 times every 1-2 hours when you are awake. Take your time and take a few normal breaths between deep breaths. 9. The spirometer may include an indicator to show your best effort. Use the indicator as a goal to work toward during each repetition. 10. After each set of 10 deep breaths, practice coughing to be sure your lungs are clear. If you have an incision (the cut made at the time of surgery), support your incision when coughing by placing a pillow or rolled up towels firmly against it. Once you are able to get out of bed, walk around indoors  and cough well. You may stop using the incentive spirometer when instructed by your caregiver.  RISKS AND COMPLICATIONS  Take your time so you do not get dizzy or light-headed.  If you are in pain, you may need to take or ask for pain medication before doing incentive spirometry. It is harder to take a deep breath if you are having  pain. AFTER USE  Rest and breathe slowly and easily.  It can be helpful to keep track of a log of your progress. Your caregiver can provide you with a simple table to help with this. If you are using the spirometer at home, follow these instructions: [place] IF:   You are having difficultly using the spirometer.  You have trouble using the spirometer as often as instructed.  Your pain medication is not giving enough relief while using the spirometer.  You develop fever of 100.5 F (38.1 C) or higher. SEEK IMMEDIATE MEDICAL CARE IF:   You cough up bloody sputum that had not been present before.  You develop fever of 102 F (38.9 C) or greater.  You develop worsening pain at or near the incision site. MAKE SURE YOU:   Understand these instructions.  Will watch your condition.  Will get help right away if you are not doing well or get worse. Document Released: 03/16/2007 Document Revised: 01/26/2012 Document Reviewed: 05/17/2007 Exit[employer] Patient Information 2014 [employer], [place].   ________________________________________________________________________  WHAT IS A BLOOD TRANSFUSION? Blood Transfusion Information  A transfusion is the replacement of blood or some of its parts. Blood is made up of multiple cells which provide different functions.  Red blood cells carry oxygen and are used for blood loss replacement.  White blood cells fight against infection.  Platelets control bleeding.  Plasma helps clot blood.  Other blood products are available for specialized needs, such as hemophilia or other clotting disorders. BEFORE THE TRANSFUSION  Who gives blood for transfusions?   Healthy volunteers who are fully evaluated to make sure their blood is safe. This is blood bank blood. Transfusion therapy is the safest it has ever been in the practice of medicine. Before blood is taken from a donor, a complete history is taken to make sure that person has no history  of diseases nor engages in risky social behavior (examples are intravenous drug use or sexual activity with multiple partners). The donor's travel history is screened to minimize risk of transmitting infections, such as malaria. The donated blood is tested for signs of infectious diseases, such as HIV and hepatitis. The blood is then tested to be sure it is compatible with you in order to minimize the chance of a transfusion reaction. If you or a relative donates blood, this is often done in anticipation of surgery and is not appropriate for emergency situations. It takes many days to process the donated blood. RISKS AND COMPLICATIONS Although transfusion therapy is very safe and saves many lives, the main dangers of transfusion include:   Getting an infectious disease.  Developing a transfusion reaction. This is an allergic reaction to something in the blood you were given. Every precaution is taken to prevent this. The decision to have a blood transfusion has been considered carefully by your caregiver before blood is given. Blood is not given unless the benefits outweigh the risks. AFTER THE TRANSFUSION  Right after receiving a blood transfusion, you will usually feel much better and more energetic. This is especially true if your red blood cells have gotten low (  anemic). The transfusion raises the level of the red blood cells which carry oxygen, and this usually causes an energy increase.  The nurse administering the transfusion will monitor you carefully for complications. HOME CARE INSTRUCTIONS  No special instructions are needed after a transfusion. You may find your energy is better. Speak with your caregiver about any limitations on activity for underlying diseases you may have. SEEK MEDICAL CARE IF:   Your condition is not improving after your transfusion.  You develop redness or irritation at the intravenous (IV) site. SEEK IMMEDIATE MEDICAL CARE IF:  Any of the following symptoms  occur over the next 12 hours:  Shaking chills.  You have a temperature by mouth above 102 F (38.9 C), not controlled by medicine.  Chest, back, or muscle pain.  People around you feel you are not acting correctly or are confused.  Shortness of breath or difficulty breathing.  Dizziness and fainting.  You get a rash or develop hives.  You have a decrease in urine output.  Your urine turns a dark color or changes to pink, red, or brown. Any of the following symptoms occur over the next 10 days:  You have a temperature by mouth above 102 F (38.9 C), not controlled by medicine.  Shortness of breath.  Weakness after normal activity.  The white part of the eye turns yellow (jaundice).  You have a decrease in the amount of urine or are urinating less often.  Your urine turns a dark color or changes to pink, red, or brown. Document Released: 10/31/2000 Document Revised: 01/26/2012 Document Reviewed: 06/19/2008 [facility] Patient Information 2014 [place], [place].  _______________________________________________________________________

## 2017-04-28 ENCOUNTER — Encounter (HOSPITAL_COMMUNITY): Payer: Self-pay

## 2017-04-28 ENCOUNTER — Encounter (HOSPITAL_COMMUNITY)
Admission: RE | Admit: 2017-04-28 | Discharge: 2017-04-28 | Disposition: A | Discharge status: Home / Self Care | Payer: Medicare Other | Source: Ambulatory Visit | Attending: Orthopedic Surgery | Admitting: Orthopedic Surgery

## 2017-04-28 DIAGNOSIS — I10 Essential (primary) hypertension: Secondary | ICD-10-CM | POA: Insufficient documentation

## 2017-04-28 DIAGNOSIS — M1712 Unilateral primary osteoarthritis, left knee: Secondary | ICD-10-CM | POA: Diagnosis not present

## 2017-04-28 DIAGNOSIS — Z01812 Encounter for preprocedural laboratory examination: Secondary | ICD-10-CM | POA: Insufficient documentation

## 2017-04-28 DIAGNOSIS — Z01810 Encounter for preprocedural cardiovascular examination: Secondary | ICD-10-CM | POA: Diagnosis not present

## 2017-04-28 HISTORY — DX: Hypothyroidism, unspecified: E03.9

## 2017-04-28 LAB — SURGICAL PCR SCREEN
MRSA, PCR: NEGATIVE
STAPHYLOCOCCUS AUREUS: NEGATIVE

## 2017-04-28 LAB — BASIC METABOLIC PANEL
ANION GAP: 10 (ref 5–15)
BUN: 16 mg/dL (ref 6–20)
CALCIUM: 9.4 mg/dL (ref 8.9–10.3)
CO2: 27 mmol/L (ref 22–32)
Chloride: 103 mmol/L (ref 101–111)
Creatinine, Ser: 0.96 mg/dL (ref 0.44–1.00)
GFR, EST NON AFRICAN AMERICAN: 59 mL/min — AB (ref >60)
Glucose, Bld: 96 mg/dL (ref 65–99)
POTASSIUM: 4.1 mmol/L (ref 3.5–5.1)
Sodium: 140 mmol/L (ref 135–145)

## 2017-04-28 LAB — ABO/RH: ABO/RH(D): O POS

## 2017-04-28 LAB — CBC
HEMATOCRIT: 46.1 % — AB (ref 36.0–46.0)
HEMOGLOBIN: 15.5 g/dL — AB (ref 12.0–15.0)
MCH: 31.6 pg (ref 26.0–34.0)
MCHC: 33.6 g/dL (ref 30.0–36.0)
MCV: 94.1 fL (ref 78.0–100.0)
Platelets: 241 10*3/uL (ref 150–400)
RBC: 4.9 MIL/uL (ref 3.87–5.11)
RDW: 14.9 % (ref 11.5–15.5)
WBC: 4.6 10*3/uL (ref 4.0–10.5)

## 2017-04-28 NOTE — H&P (Signed)
TOTAL KNEE ADMISSION H&P  Patient is being admitted for left total knee arthroplasty.  Subjective:  Chief Complaint:   Left knee primary OA / pain  HPI: Stephanie Daniel, 66 y.o. female, has a history of pain and functional disability in the left knee due to arthritis and has failed non-surgical conservative treatments for greater than 12 weeks to include NSAID's and/or analgesics, corticosteriod injections, viscosupplementation injections and activity modification.  Onset of symptoms was gradual, starting ~3 years ago with gradually worsening course since that time. The patient noted prior procedures on the knee to include  arthroscopy and menisectomy on the left knee(s).  Patient currently rates pain in the left knee(s) at 10 out of 10 with activity. Patient has worsening of pain with activity and weight bearing, pain that interferes with activities of daily living, pain with passive range of motion, crepitus and joint swelling.  Patient has evidence of periarticular osteophytes and joint space narrowing by imaging studies.  There is no active infection.   Risks, benefits and expectations were discussed with the patient.  Risks including but not limited to the risk of anesthesia, blood clots, nerve damage, blood vessel damage, failure of the prosthesis, infection and up to and including death.  Patient understand the risks, benefits and expectations and wishes to proceed with surgery.    PCP: Kelton Pillar, MD  D/C Plans:       Home   Post-op Meds:       No Rx given  Tranexamic Acid:      To be given - topically (previous blood clots)  Decadron:      Is to be given  FYI:     Xarelto then ASA  Norco  DME:   Pt already has equipment  PT: [facility]  Patient Active Problem List   Diagnosis Date Noted  . Essential hypertension, benign 01/02/2014   Past Medical History:  Diagnosis Date  . Arthritis   . Hypertension   . Hypothyroidism   . Thyroid disease     Past Surgical History:   Procedure Laterality Date  . ABDOMINAL HYSTERECTOMY    . APPENDECTOMY    . KNEE SURGERY  2013    No prescriptions prior to admission.   No Known Allergies   Social History  Substance Use Topics  . Smoking status: Former Smoker    Quit date: 04/28/1989  . Smokeless tobacco: Never Used  . Alcohol use No     Comment: Occas    Family History  Problem Relation Age of Onset  . COPD Mother   . Heart disease Mother   . Heart disease Father   . Cancer Father   . COPD Brother   . Heart disease Brother   . Hypertension Son   . Liver disease Son      Review of Systems  Constitutional: Negative.   HENT: Negative.   Eyes: Negative.   Respiratory: Negative.   Cardiovascular: Negative.   Gastrointestinal: Negative.   Genitourinary: Negative.   Musculoskeletal: Positive for joint pain.  Skin: Negative.   Neurological: Negative.   Endo/Heme/Allergies: Negative.   Psychiatric/Behavioral: Negative.     Objective:  Physical Exam  Constitutional: She is oriented to person, place, and time. She appears well-developed.  HENT:  Head: Normocephalic.  Eyes: Pupils are equal, round, and reactive to light.  Neck: Neck supple. No JVD present. No tracheal deviation present. No thyromegaly present.  Cardiovascular: Normal rate, regular rhythm and intact distal pulses.   Respiratory: Effort  normal and breath sounds normal. No respiratory distress. She has no wheezes.  GI: Soft. There is no tenderness. There is no guarding.  Musculoskeletal:       Left knee: She exhibits decreased range of motion, swelling and bony tenderness. She exhibits no ecchymosis, no deformity, no laceration and no erythema. Tenderness found.  Lymphadenopathy:    She has no cervical adenopathy.  Neurological: She is alert and oriented to person, place, and time.  Skin: Skin is warm and dry.  Psychiatric: She has a normal mood and affect.    Vital signs in last 24 hours: Temp:  [97.7 F (36.5 C)] 97.7 F (36.5  C) (06/12 1108) Pulse Rate:  [74] 74 (06/12 1108) Resp:  [16] 16 (06/12 1108) BP: (140)/(78) 140/78 (06/12 1108) SpO2:  [99 %] 99 % (06/12 1108) Weight:  [79.9 kg (176 lb 2 oz)] 79.9 kg (176 lb 2 oz) (06/12 1108)  Labs:   Estimated body mass index is 30.23 kg/m as calculated from the following:   Height as of 04/28/17: 5\' 4"  (1.626 m).   Weight as of 04/28/17: 79.9 kg (176 lb 2 oz).   Imaging Review Plain radiographs demonstrate severe degenerative joint disease of the left knee(s).  The bone quality appears to be good for age and reported activity level.  Assessment/Plan:  End stage arthritis, left knee   The patient history, physical examination, clinical judgment of the provider and imaging studies are consistent with end stage degenerative joint disease of the left knee(s) and total knee arthroplasty is deemed medically necessary. The treatment options including medical management, injection therapy arthroscopy and arthroplasty were discussed at length. The risks and benefits of total knee arthroplasty were presented and reviewed. The risks due to aseptic loosening, infection, stiffness, patella tracking problems, thromboembolic complications and other imponderables were discussed. The patient acknowledged the explanation, agreed to proceed with the plan and consent was signed. Patient is being admitted for inpatient treatment for surgery, pain control, PT, OT, prophylactic antibiotics, VTE prophylaxis, progressive ambulation and ADL's and discharge planning. The patient is planning to be discharged home.     West Pugh Harsha Yusko   PA-C  04/28/2017, 10:47 PM

## 2017-05-04 ENCOUNTER — Inpatient Hospital Stay (HOSPITAL_COMMUNITY): Payer: Medicare Other | Admitting: Certified Registered Nurse Anesthetist

## 2017-05-04 ENCOUNTER — Encounter (HOSPITAL_COMMUNITY): Payer: Self-pay | Admitting: *Deleted

## 2017-05-04 ENCOUNTER — Encounter (HOSPITAL_COMMUNITY)
Admission: RE | Disposition: A | Discharge status: Home / Self Care | Payer: Self-pay | Source: Ambulatory Visit | Attending: Orthopedic Surgery

## 2017-05-04 ENCOUNTER — Inpatient Hospital Stay (HOSPITAL_COMMUNITY)
Admission: RE | Admit: 2017-05-04 | Discharge: 2017-05-06 | DRG: 470 | Disposition: A | Discharge status: Home / Self Care | Payer: Medicare Other | Source: Ambulatory Visit | Attending: Orthopedic Surgery | Admitting: Orthopedic Surgery

## 2017-05-04 DIAGNOSIS — I10 Essential (primary) hypertension: Secondary | ICD-10-CM | POA: Diagnosis present

## 2017-05-04 DIAGNOSIS — Z90710 Acquired absence of both cervix and uterus: Secondary | ICD-10-CM

## 2017-05-04 DIAGNOSIS — Z96652 Presence of left artificial knee joint: Secondary | ICD-10-CM

## 2017-05-04 DIAGNOSIS — Z86718 Personal history of other venous thrombosis and embolism: Secondary | ICD-10-CM

## 2017-05-04 DIAGNOSIS — Z87891 Personal history of nicotine dependence: Secondary | ICD-10-CM

## 2017-05-04 DIAGNOSIS — M1712 Unilateral primary osteoarthritis, left knee: Principal | ICD-10-CM | POA: Diagnosis present

## 2017-05-04 DIAGNOSIS — Z6830 Body mass index (BMI) 30.0-30.9, adult: Secondary | ICD-10-CM

## 2017-05-04 DIAGNOSIS — K219 Gastro-esophageal reflux disease without esophagitis: Secondary | ICD-10-CM | POA: Diagnosis present

## 2017-05-04 DIAGNOSIS — G8918 Other acute postprocedural pain: Secondary | ICD-10-CM | POA: Diagnosis not present

## 2017-05-04 DIAGNOSIS — Z96659 Presence of unspecified artificial knee joint: Secondary | ICD-10-CM

## 2017-05-04 DIAGNOSIS — M25562 Pain in left knee: Secondary | ICD-10-CM | POA: Diagnosis not present

## 2017-05-04 DIAGNOSIS — E669 Obesity, unspecified: Secondary | ICD-10-CM | POA: Diagnosis not present

## 2017-05-04 DIAGNOSIS — E039 Hypothyroidism, unspecified: Secondary | ICD-10-CM | POA: Diagnosis present

## 2017-05-04 HISTORY — PX: TOTAL KNEE ARTHROPLASTY: SHX125

## 2017-05-04 LAB — TYPE AND SCREEN
ABO/RH(D): O POS
Antibody Screen: NEGATIVE

## 2017-05-04 SURGERY — ARTHROPLASTY, KNEE, TOTAL
Anesthesia: Regional | Site: Knee | Laterality: Left

## 2017-05-04 MED ORDER — SODIUM CHLORIDE 0.9 % IJ SOLN
INTRAMUSCULAR | Status: AC
  Filled 2017-05-04: qty 50

## 2017-05-04 MED ORDER — SODIUM CHLORIDE 0.9 % IR SOLN
Status: DC | PRN
  Administered 2017-05-04: 1000 mL

## 2017-05-04 MED ORDER — PROPOFOL 10 MG/ML IV BOLUS
INTRAVENOUS | Status: AC
  Filled 2017-05-04: qty 20

## 2017-05-04 MED ORDER — POLYETHYLENE GLYCOL 3350 17 G PO PACK
17.0000 g | PACK | Freq: Two times a day (BID) | ORAL | Status: DC
  Administered 2017-05-04 – 2017-05-06 (×4): 17 g via ORAL
  Filled 2017-05-04 (×4): qty 1

## 2017-05-04 MED ORDER — CHLORHEXIDINE GLUCONATE 4 % EX LIQD: 60.0000 mL | Freq: Once | CUTANEOUS | Status: DC

## 2017-05-04 MED ORDER — CEFAZOLIN SODIUM-DEXTROSE 2-4 GM/100ML-% IV SOLN
INTRAVENOUS | Status: AC
  Filled 2017-05-04: qty 100

## 2017-05-04 MED ORDER — RIVAROXABAN 10 MG PO TABS
10.0000 mg | ORAL_TABLET | ORAL | Status: DC
  Administered 2017-05-05 – 2017-05-06 (×2): 10 mg via ORAL
  Filled 2017-05-04 (×2): qty 1

## 2017-05-04 MED ORDER — SODIUM CHLORIDE 0.9 % IV SOLN
INTRAVENOUS | Status: DC
  Administered 2017-05-04 – 2017-05-05 (×3): via INTRAVENOUS

## 2017-05-04 MED ORDER — SODIUM CHLORIDE 0.9 % IJ SOLN
INTRAMUSCULAR | Status: DC | PRN
  Administered 2017-05-04: 30 mL

## 2017-05-04 MED ORDER — DIPHENHYDRAMINE HCL 25 MG PO CAPS: 25.0000 mg | ORAL_CAPSULE | Freq: Four times a day (QID) | ORAL | Status: DC | PRN

## 2017-05-04 MED ORDER — FENTANYL CITRATE (PF) 100 MCG/2ML IJ SOLN
INTRAMUSCULAR | Status: AC
  Filled 2017-05-04: qty 2

## 2017-05-04 MED ORDER — PANTOPRAZOLE SODIUM 40 MG PO TBEC
80.0000 mg | DELAYED_RELEASE_TABLET | Freq: Every day | ORAL | Status: DC
  Administered 2017-05-05 – 2017-05-06 (×2): 80 mg via ORAL
  Filled 2017-05-04 (×2): qty 2

## 2017-05-04 MED ORDER — FENTANYL CITRATE (PF) 100 MCG/2ML IJ SOLN
INTRAMUSCULAR | Status: DC | PRN
  Administered 2017-05-04 (×3): 50 ug via INTRAVENOUS

## 2017-05-04 MED ORDER — METOCLOPRAMIDE HCL 5 MG PO TABS: 5.0000 mg | ORAL_TABLET | Freq: Three times a day (TID) | ORAL | Status: DC | PRN

## 2017-05-04 MED ORDER — HYDROCHLOROTHIAZIDE 12.5 MG PO CAPS
12.5000 mg | ORAL_CAPSULE | Freq: Every day | ORAL | Status: DC
  Administered 2017-05-06: 12.5 mg via ORAL
  Filled 2017-05-04 (×2): qty 1

## 2017-05-04 MED ORDER — DEXAMETHASONE SODIUM PHOSPHATE 10 MG/ML IJ SOLN
10.0000 mg | Freq: Once | INTRAMUSCULAR | Status: AC
  Administered 2017-05-04: 10 mg via INTRAVENOUS

## 2017-05-04 MED ORDER — OXYCODONE HCL 5 MG PO TABS: 5.0000 mg | ORAL_TABLET | Freq: Once | ORAL | Status: DC | PRN

## 2017-05-04 MED ORDER — MENTHOL 3 MG MT LOZG: 1.0000 | LOZENGE | OROMUCOSAL | Status: DC | PRN

## 2017-05-04 MED ORDER — HYDROCODONE-ACETAMINOPHEN 7.5-325 MG PO TABS
1.0000 | ORAL_TABLET | ORAL | Status: DC
  Administered 2017-05-04: 1 via ORAL
  Administered 2017-05-04: 2 via ORAL
  Administered 2017-05-04: 1 via ORAL
  Administered 2017-05-05 – 2017-05-06 (×8): 2 via ORAL
  Filled 2017-05-04 (×2): qty 2
  Filled 2017-05-04: qty 1
  Filled 2017-05-04 (×6): qty 2
  Filled 2017-05-04: qty 1
  Filled 2017-05-04: qty 2

## 2017-05-04 MED ORDER — BISACODYL 10 MG RE SUPP: 10.0000 mg | Freq: Every day | RECTAL | Status: DC | PRN

## 2017-05-04 MED ORDER — PROPOFOL 10 MG/ML IV BOLUS
INTRAVENOUS | Status: AC
  Filled 2017-05-04: qty 40

## 2017-05-04 MED ORDER — METHOCARBAMOL 500 MG PO TABS
500.0000 mg | ORAL_TABLET | Freq: Four times a day (QID) | ORAL | Status: DC | PRN
  Administered 2017-05-04 – 2017-05-06 (×3): 500 mg via ORAL
  Filled 2017-05-04 (×3): qty 1

## 2017-05-04 MED ORDER — MIDAZOLAM HCL 2 MG/2ML IJ SOLN
INTRAMUSCULAR | Status: AC
  Filled 2017-05-04: qty 2

## 2017-05-04 MED ORDER — FENTANYL CITRATE (PF) 100 MCG/2ML IJ SOLN: 25.0000 ug | INTRAMUSCULAR | Status: DC | PRN

## 2017-05-04 MED ORDER — GABAPENTIN 300 MG PO CAPS
300.0000 mg | ORAL_CAPSULE | Freq: Every day | ORAL | Status: DC
  Administered 2017-05-04 – 2017-05-05 (×2): 300 mg via ORAL
  Filled 2017-05-04 (×2): qty 1

## 2017-05-04 MED ORDER — PROMETHAZINE HCL 25 MG/ML IJ SOLN
INTRAMUSCULAR | Status: AC
  Administered 2017-05-04: 6.25 mg via INTRAVENOUS
  Filled 2017-05-04: qty 1

## 2017-05-04 MED ORDER — ALUM & MAG HYDROXIDE-SIMETH 200-200-20 MG/5ML PO SUSP: 15.0000 mL | ORAL | Status: DC | PRN

## 2017-05-04 MED ORDER — CELECOXIB 200 MG PO CAPS
200.0000 mg | ORAL_CAPSULE | Freq: Two times a day (BID) | ORAL | Status: DC
  Administered 2017-05-04 – 2017-05-06 (×4): 200 mg via ORAL
  Filled 2017-05-04 (×4): qty 1

## 2017-05-04 MED ORDER — CEFAZOLIN SODIUM-DEXTROSE 2-4 GM/100ML-% IV SOLN
2.0000 g | INTRAVENOUS | Status: AC
  Administered 2017-05-04: 2 g via INTRAVENOUS

## 2017-05-04 MED ORDER — FERROUS SULFATE 325 (65 FE) MG PO TABS: 325.0000 mg | ORAL_TABLET | Freq: Three times a day (TID) | ORAL | Status: DC

## 2017-05-04 MED ORDER — SUMATRIPTAN SUCCINATE 50 MG PO TABS
100.0000 mg | ORAL_TABLET | Freq: Two times a day (BID) | ORAL | Status: DC | PRN
  Filled 2017-05-04: qty 2

## 2017-05-04 MED ORDER — BUTALBITAL-APAP-CAFF-COD 50-325-40-30 MG PO CAPS
1.0000 | ORAL_CAPSULE | Freq: Four times a day (QID) | ORAL | Status: DC | PRN
  Filled 2017-05-04: qty 1

## 2017-05-04 MED ORDER — BUPIVACAINE HCL (PF) 0.75 % IJ SOLN
INTRAMUSCULAR | Status: DC | PRN
  Administered 2017-05-04: 1.7 mL

## 2017-05-04 MED ORDER — OXYCODONE HCL 5 MG/5ML PO SOLN
5.0000 mg | Freq: Once | ORAL | Status: DC | PRN
  Filled 2017-05-04: qty 5

## 2017-05-04 MED ORDER — ACYCLOVIR 400 MG PO TABS
400.0000 mg | ORAL_TABLET | Freq: Two times a day (BID) | ORAL | Status: DC
  Administered 2017-05-04 – 2017-05-06 (×5): 400 mg via ORAL
  Filled 2017-05-04 (×5): qty 1

## 2017-05-04 MED ORDER — MEPERIDINE HCL 50 MG/ML IJ SOLN
INTRAMUSCULAR | Status: AC
  Administered 2017-05-04: 12.5 mg via INTRAVENOUS
  Filled 2017-05-04: qty 1

## 2017-05-04 MED ORDER — DOCUSATE SODIUM 100 MG PO CAPS
100.0000 mg | ORAL_CAPSULE | Freq: Two times a day (BID) | ORAL | Status: DC
  Administered 2017-05-04 – 2017-05-06 (×4): 100 mg via ORAL
  Filled 2017-05-04 (×4): qty 1

## 2017-05-04 MED ORDER — PHENOL 1.4 % MT LIQD: 1.0000 | OROMUCOSAL | Status: DC | PRN

## 2017-05-04 MED ORDER — RIVAROXABAN 10 MG PO TABS: 10.0000 mg | ORAL_TABLET | Freq: Every day | ORAL | 0 refills | Status: DC

## 2017-05-04 MED ORDER — PROMETHAZINE HCL 25 MG/ML IJ SOLN
6.2500 mg | INTRAMUSCULAR | Status: DC | PRN
  Administered 2017-05-04: 6.25 mg via INTRAVENOUS

## 2017-05-04 MED ORDER — HYDROMORPHONE HCL 1 MG/ML IJ SOLN
INTRAMUSCULAR | Status: AC
  Administered 2017-05-04: 0.5 mg via INTRAVENOUS
  Filled 2017-05-04: qty 2

## 2017-05-04 MED ORDER — MIDAZOLAM HCL 5 MG/5ML IJ SOLN
INTRAMUSCULAR | Status: DC | PRN
  Administered 2017-05-04: 1 mg via INTRAVENOUS
  Administered 2017-05-04: 2 mg via INTRAVENOUS

## 2017-05-04 MED ORDER — LEVOTHYROXINE SODIUM 50 MCG PO TABS
50.0000 ug | ORAL_TABLET | Freq: Every day | ORAL | Status: DC
  Administered 2017-05-05 – 2017-05-06 (×2): 50 ug via ORAL
  Filled 2017-05-04 (×2): qty 1

## 2017-05-04 MED ORDER — KETOROLAC TROMETHAMINE 30 MG/ML IJ SOLN
INTRAMUSCULAR | Status: AC
  Filled 2017-05-04: qty 1

## 2017-05-04 MED ORDER — HYDROCODONE-ACETAMINOPHEN 7.5-325 MG PO TABS: 1.0000 | ORAL_TABLET | ORAL | 0 refills | Status: DC | PRN

## 2017-05-04 MED ORDER — METHOCARBAMOL 500 MG PO TABS: 500.0000 mg | ORAL_TABLET | Freq: Four times a day (QID) | ORAL | 0 refills | Status: DC | PRN

## 2017-05-04 MED ORDER — FERROUS SULFATE 325 (65 FE) MG PO TABS
325.0000 mg | ORAL_TABLET | Freq: Three times a day (TID) | ORAL | Status: DC
  Filled 2017-05-04: qty 1

## 2017-05-04 MED ORDER — BUPIVACAINE-EPINEPHRINE (PF) 0.25% -1:200000 IJ SOLN
INTRAMUSCULAR | Status: DC | PRN
  Administered 2017-05-04: 30 mL

## 2017-05-04 MED ORDER — MIDAZOLAM HCL 5 MG/ML IJ SOLN
2.0000 mg | Freq: Once | INTRAMUSCULAR | Status: AC
  Administered 2017-05-04: 1 mg via INTRAVENOUS

## 2017-05-04 MED ORDER — LACTATED RINGERS IV SOLN
INTRAVENOUS | Status: DC
  Administered 2017-05-04 (×3): via INTRAVENOUS

## 2017-05-04 MED ORDER — POLYETHYLENE GLYCOL 3350 17 G PO PACK: 17.0000 g | PACK | Freq: Two times a day (BID) | ORAL | 0 refills | Status: DC

## 2017-05-04 MED ORDER — ROPIVACAINE HCL 5 MG/ML IJ SOLN
INTRAMUSCULAR | Status: DC | PRN
  Administered 2017-05-04: 30 mL via PERINEURAL

## 2017-05-04 MED ORDER — DEXAMETHASONE SODIUM PHOSPHATE 10 MG/ML IJ SOLN
10.0000 mg | Freq: Once | INTRAMUSCULAR | Status: AC
  Administered 2017-05-05: 10 mg via INTRAVENOUS
  Filled 2017-05-04: qty 1

## 2017-05-04 MED ORDER — ONDANSETRON HCL 4 MG/2ML IJ SOLN
4.0000 mg | Freq: Four times a day (QID) | INTRAMUSCULAR | Status: DC | PRN
  Administered 2017-05-04: 4 mg via INTRAVENOUS
  Filled 2017-05-04: qty 2

## 2017-05-04 MED ORDER — EPHEDRINE 5 MG/ML INJ
INTRAVENOUS | Status: AC
  Filled 2017-05-04: qty 10

## 2017-05-04 MED ORDER — CEFAZOLIN SODIUM-DEXTROSE 2-4 GM/100ML-% IV SOLN
2.0000 g | Freq: Four times a day (QID) | INTRAVENOUS | Status: AC
  Administered 2017-05-04 (×2): 2 g via INTRAVENOUS
  Filled 2017-05-04 (×2): qty 100

## 2017-05-04 MED ORDER — ASPIRIN 81 MG PO CHEW
81.0000 mg | CHEWABLE_TABLET | Freq: Two times a day (BID) | ORAL | 0 refills | Status: DC
Start: 2017-05-20 — End: 2017-05-06

## 2017-05-04 MED ORDER — ONDANSETRON HCL 4 MG PO TABS
4.0000 mg | ORAL_TABLET | Freq: Four times a day (QID) | ORAL | Status: DC | PRN
  Filled 2017-05-04: qty 1

## 2017-05-04 MED ORDER — MAGNESIUM CITRATE PO SOLN: 1.0000 | Freq: Once | ORAL | Status: DC | PRN

## 2017-05-04 MED ORDER — METOCLOPRAMIDE HCL 5 MG/ML IJ SOLN: 5.0000 mg | Freq: Three times a day (TID) | INTRAMUSCULAR | Status: DC | PRN

## 2017-05-04 MED ORDER — METHOCARBAMOL 1000 MG/10ML IJ SOLN
500.0000 mg | Freq: Four times a day (QID) | INTRAVENOUS | Status: DC | PRN
  Administered 2017-05-04: 500 mg via INTRAVENOUS
  Filled 2017-05-04: qty 550

## 2017-05-04 MED ORDER — FENTANYL CITRATE (PF) 100 MCG/2ML IJ SOLN
INTRAMUSCULAR | Status: AC
  Administered 2017-05-04: 50 ug via INTRAVENOUS
  Filled 2017-05-04: qty 2

## 2017-05-04 MED ORDER — BUTALBITAL-APAP-CAFFEINE 50-325-40 MG PO TABS: 1.0000 | ORAL_TABLET | Freq: Four times a day (QID) | ORAL | Status: DC | PRN

## 2017-05-04 MED ORDER — CODEINE SULFATE 30 MG PO TABS: 30.0000 mg | ORAL_TABLET | Freq: Four times a day (QID) | ORAL | Status: DC | PRN

## 2017-05-04 MED ORDER — BUPIVACAINE-EPINEPHRINE (PF) 0.25% -1:200000 IJ SOLN
INTRAMUSCULAR | Status: AC
  Filled 2017-05-04: qty 30

## 2017-05-04 MED ORDER — FENTANYL CITRATE (PF) 100 MCG/2ML IJ SOLN
100.0000 ug | Freq: Once | INTRAMUSCULAR | Status: AC
  Administered 2017-05-04: 50 ug via INTRAVENOUS

## 2017-05-04 MED ORDER — TRANEXAMIC ACID 1000 MG/10ML IV SOLN
2000.0000 mg | Freq: Once | INTRAVENOUS | Status: AC
  Administered 2017-05-04: 2000 mg via TOPICAL
  Filled 2017-05-04: qty 20

## 2017-05-04 MED ORDER — EPHEDRINE SULFATE 50 MG/ML IJ SOLN
INTRAMUSCULAR | Status: DC | PRN
  Administered 2017-05-04 (×2): 5 mg via INTRAVENOUS

## 2017-05-04 MED ORDER — HYDROMORPHONE HCL 1 MG/ML IJ SOLN
0.2500 mg | INTRAMUSCULAR | Status: DC | PRN
  Administered 2017-05-04 (×4): 0.5 mg via INTRAVENOUS

## 2017-05-04 MED ORDER — PROPOFOL 500 MG/50ML IV EMUL
INTRAVENOUS | Status: DC | PRN
  Administered 2017-05-04: 100 ug/kg/min via INTRAVENOUS

## 2017-05-04 MED ORDER — KETOROLAC TROMETHAMINE 30 MG/ML IJ SOLN
INTRAMUSCULAR | Status: DC | PRN
  Administered 2017-05-04: 30 mg

## 2017-05-04 MED ORDER — DOCUSATE SODIUM 100 MG PO CAPS: 100.0000 mg | ORAL_CAPSULE | Freq: Two times a day (BID) | ORAL | 0 refills | Status: DC

## 2017-05-04 MED ORDER — MEPERIDINE HCL 50 MG/ML IJ SOLN
6.2500 mg | INTRAMUSCULAR | Status: DC | PRN
Start: 2017-05-04 — End: 2017-05-04
  Administered 2017-05-04: 12.5 mg via INTRAVENOUS

## 2017-05-04 SURGICAL SUPPLY — 48 items
ADH SKN CLS APL DERMABOND .7 (GAUZE/BANDAGES/DRESSINGS) ×1
BAG DECANTER FOR FLEXI CONT (MISCELLANEOUS) IMPLANT
BAG SPEC THK2 15X12 ZIP CLS (MISCELLANEOUS)
BAG ZIPLOCK 12X15 (MISCELLANEOUS) IMPLANT
BANDAGE ACE 6X5 VEL STRL LF (GAUZE/BANDAGES/DRESSINGS) ×2 IMPLANT
BANDAGE ELASTIC 6 VELCRO ST LF (GAUZE/BANDAGES/DRESSINGS) ×1 IMPLANT
BLADE SAW SGTL 11.0X1.19X90.0M (BLADE) IMPLANT
BLADE SAW SGTL 13.0X1.19X90.0M (BLADE) ×2 IMPLANT
BOWL SMART MIX CTS (DISPOSABLE) ×2 IMPLANT
CAPT KNEE TOTAL 3 ATTUNE ×1 IMPLANT
CEMENT HV SMART SET (Cement) ×1 IMPLANT
COVER SURGICAL LIGHT HANDLE (MISCELLANEOUS) ×2 IMPLANT
CUFF TOURN SGL QUICK 34 (TOURNIQUET CUFF) ×2
CUFF TRNQT CYL 34X4X40X1 (TOURNIQUET CUFF) ×1 IMPLANT
DECANTER SPIKE VIAL GLASS SM (MISCELLANEOUS) ×2 IMPLANT
DERMABOND ADVANCED (GAUZE/BANDAGES/DRESSINGS) ×1
DERMABOND ADVANCED .7 DNX12 (GAUZE/BANDAGES/DRESSINGS) ×1 IMPLANT
DRAPE U-SHAPE 47X51 STRL (DRAPES) ×2 IMPLANT
DRESSING AQUACEL AG SP 3.5X10 (GAUZE/BANDAGES/DRESSINGS) ×1 IMPLANT
DRSG AQUACEL AG SP 3.5X10 (GAUZE/BANDAGES/DRESSINGS) ×2
DURAPREP 26ML APPLICATOR (WOUND CARE) ×4 IMPLANT
ELECT REM PT RETURN 15FT ADLT (MISCELLANEOUS) ×2 IMPLANT
GLOVE BIOGEL M 7.0 STRL (GLOVE) IMPLANT
GLOVE BIOGEL PI IND STRL 7.5 (GLOVE) ×1 IMPLANT
GLOVE BIOGEL PI IND STRL 8.5 (GLOVE) ×1 IMPLANT
GLOVE BIOGEL PI INDICATOR 7.5 (GLOVE) ×1
GLOVE BIOGEL PI INDICATOR 8.5 (GLOVE) ×1
GLOVE ECLIPSE 8.0 STRL XLNG CF (GLOVE) ×2 IMPLANT
GLOVE ORTHO TXT STRL SZ7.5 (GLOVE) ×4 IMPLANT
GOWN STRL REUS W/TWL LRG LVL3 (GOWN DISPOSABLE) ×2 IMPLANT
GOWN STRL REUS W/TWL XL LVL3 (GOWN DISPOSABLE) ×2 IMPLANT
HANDPIECE INTERPULSE COAX TIP (DISPOSABLE) ×2
MANIFOLD NEPTUNE II (INSTRUMENTS) ×2 IMPLANT
PACK TOTAL KNEE CUSTOM (KITS) ×2 IMPLANT
POSITIONER SURGICAL ARM (MISCELLANEOUS) ×2 IMPLANT
SET HNDPC FAN SPRY TIP SCT (DISPOSABLE) ×1 IMPLANT
SET PAD KNEE POSITIONER (MISCELLANEOUS) ×2 IMPLANT
SUT MNCRL AB 4-0 PS2 18 (SUTURE) ×2 IMPLANT
SUT STRATAFIX 0 PDS 27 VIOLET (SUTURE) ×2
SUT VIC AB 1 CT1 36 (SUTURE) ×2 IMPLANT
SUT VIC AB 2-0 CT1 27 (SUTURE) ×6
SUT VIC AB 2-0 CT1 TAPERPNT 27 (SUTURE) ×3 IMPLANT
SUTURE STRATFX 0 PDS 27 VIOLET (SUTURE) ×1 IMPLANT
SYR 50ML LL SCALE MARK (SYRINGE) ×2 IMPLANT
TRAY FOLEY W/METER SILVER 16FR (SET/KITS/TRAYS/PACK) ×2 IMPLANT
WATER STERILE IRR 1500ML POUR (IV SOLUTION) ×2 IMPLANT
WRAP KNEE MAXI GEL POST OP (GAUZE/BANDAGES/DRESSINGS) ×2 IMPLANT
YANKAUER SUCT BULB TIP 10FT TU (MISCELLANEOUS) ×2 IMPLANT

## 2017-05-04 NOTE — Op Note (Signed)
NAME:  [place] RECORD NO.:  [id number]                             FACILITY:  [facility]      PHYSICIAN:  Pietro Cassis. Alvan Dame, M.D.  DATE OF BIRTH:  27-Aug-1951      DATE OF PROCEDURE:  05/04/2017                                     OPERATIVE REPORT         PREOPERATIVE DIAGNOSIS:  Left knee osteoarthritis.      POSTOPERATIVE DIAGNOSIS:  Left knee osteoarthritis.      FINDINGS:  The patient was noted to have complete loss of cartilage and   bone-on-bone arthritis with associated osteophytes in the lateral and patellofemoral compartments of   the knee with a significant synovitis and associated effusion.      PROCEDURE:  Left total knee replacement.      COMPONENTS USED:  DePuy Attune rotating platform posterior stabilized knee   system, a size 4N femur, 4 tibia, size 6 PS AOX insert, and 38 anatomic patellar   button.      SURGEON:  Pietro Cassis. Alvan Dame, M.D.      ASSISTANT:  Danae Orleans, PA-C.      ANESTHESIA:  Spinal.      SPECIMENS:  None.      COMPLICATION:  None.      DRAINS:  None.  EBL: <100cc      TOURNIQUET TIME:   Total Tourniquet Time Documented: Thigh (Left) - 25 minutes Total: Thigh (Left) - 25 minutes  .      The patient was stable to the recovery room.      INDICATION FOR PROCEDURE:  GENEVER HENTGES is a 66 y.o. female patient of   mine.  The patient had been seen, evaluated, and treated conservatively in the   office with medication, activity modification, and injections.  The patient had   radiographic changes of bone-on-bone arthritis with endplate sclerosis and osteophytes noted.      The patient failed conservative measures including medication, injections, and activity modification, and at this point was ready for more definitive measures.   Based on the radiographic changes and failed conservative measures, the patient   decided to proceed with total knee replacement.  Risks of infection,   DVT, component failure,  need for revision surgery, postop course, and   expectations were all   discussed and reviewed.  Consent was obtained for benefit of pain   relief.      PROCEDURE IN DETAIL:  The patient was brought to the operative theater.   Once adequate anesthesia, preoperative antibiotics, 2 gm of Ancef, 1 gm of Tranexamic Acid, and 10 mg of Decadron administered, the patient was positioned supine with the left thigh tourniquet placed.  The  left lower extremity was prepped and draped in sterile fashion.  A time-   out was performed identifying the patient, planned procedure, and   extremity.      The left lower extremity was placed in the [facility] leg holder.  The leg was   exsanguinated, tourniquet elevated to 250 mmHg.  A midline incision was   made followed  by median parapatellar arthrotomy.  Following initial   exposure, attention was first directed to the patella.  Precut   measurement was noted to be 21 mm.  I resected down to 13 mm and used a   38 anatomic patellar button to restore patellar height as well as cover the cut   surface.      The lug holes were drilled and a metal shim was placed to protect the   patella from retractors and saw blades.      At this point, attention was now directed to the femur.  The femoral   canal was opened with a drill, irrigated to try to prevent fat emboli.  An   intramedullary rod was passed at 3 degrees valgus, 8 mm of bone was   resected off the distal femur due to pre-op hyperextension.  Following this resection, the tibia was   subluxated anteriorly.  Using the extramedullary guide, 2 mm of bone was resected off   the proximal lateral tibia.  We confirmed the gap would be   stable medially and laterally with a size 5 spacer block as well as confirmed   the cut was perpendicular in the coronal plane, checking with an alignment rod.      Once this was done, I sized the femur to be a size 4 in the anterior-   posterior dimension, chose a narrow  component based on medial and   lateral dimension.  The size 4 rotation block was then pinned in   position anterior referenced using the C-clamp to set rotation.  The   anterior, posterior, and  chamfer cuts were made without difficulty nor   notching making certain that I was along the anterior cortex to help   with flexion gap stability.      The final box cut was made off the lateral aspect of distal femur.      At this point, the tibia was sized to be a size 4, the size 4 tray was   then pinned in position through the medial third of the tubercle,   drilled, and keel punched.  Trial reduction was now carried with a 4 femur,  4 tibia, a size 5 then 6 PS insert, and the 38 anatomic patella botton.  The knee was brought to   extension, full extension with good flexion stability with the patella   tracking through the trochlea without application of pressure.  Given   all these findings the femoral lug holes were drilled and then the trial components removed.  Final components were   opened and cement was mixed.  The knee was irrigated with normal saline   solution and pulse lavage.  The synovial lining was   then injected with 30 cc of 0.25% Marcaine with epinephrine and 1 cc of Toradol plus 30 cc of NS for a   total of 61 cc.      The knee was irrigated.  Final implants were then cemented onto clean and   dried cut surfaces of bone with the knee brought to extension with a size 6 PS trial insert.      Once the cement had fully cured, the excess cement was removed   throughout the knee.  I confirmed I was satisfied with the range of   motion and stability, and the final size 6 PS AOX insert was chosen.  It was   placed into the knee.      The tourniquet had been  let down at 25 minutes.  No significant   hemostasis required.  The   extensor mechanism was then reapproximated using #1 Vicryl and #0 V-lock sutures with the knee   in flexion.  The   remaining wound was closed with 2-0  Vicryl and running 4-0 Monocryl.   The knee was cleaned, dried, dressed sterilely using Dermabond and   Aquacel dressing.  The patient was then   brought to recovery room in stable condition, tolerating the procedure   well.   Please note that Physician Assistant, Danae Orleans, PA-C, was present for the entirety of the case, and was utilized for pre-operative positioning, peri-operative retractor management, general facilitation of the procedure.  He was also utilized for primary wound closure at the end of the case.              Pietro Cassis Alvan Dame, M.D.    05/04/2017 10:55 AM

## 2017-05-04 NOTE — Interval H&P Note (Signed)
History and Physical Interval Note:  05/04/2017 8:59 AM  Stephanie Daniel  has presented today for surgery, with the diagnosis of Left knee osteoarthritis  The various methods of treatment have been discussed with the patient and family. After consideration of risks, benefits and other options for treatment, the patient has consented to  Procedure(s) with comments: LEFT TOTAL KNEE ARTHROPLASTY (Left) - 90 mins as a surgical intervention .  The patient's history has been reviewed, patient examined, no change in status, stable for surgery.  I have reviewed the patient's chart and labs.  Questions were answered to the patient's satisfaction.     Mauri Pole

## 2017-05-04 NOTE — Evaluation (Signed)
Physical Therapy Evaluation Patient Details Name: Stephanie Daniel MRN: [id number] DOB: [birth year] Today's Date: 05/04/2017   History of Present Illness  66 yo female s/p L TKA 05/04/17  Clinical Impression  On eval POD 0, pt required Min assist for mobility. She walked ~40 feet with a RW. Distance was limited by pain and N/V-made RN aware. Will follow and progress activity as tolerated.     Follow Up Recommendations DC plan and follow up therapy as arranged by surgeon (virtual PT)    Equipment Recommendations  None recommended by PT    Recommendations for Other Services       Precautions / Restrictions Precautions Precautions: Fall;Knee Restrictions Weight Bearing Restrictions: No LLE Weight Bearing: Weight bearing as tolerated      Mobility  Bed Mobility Overal bed mobility: Needs Assistance Bed Mobility: Supine to Sit     Supine to sit: Min assist;HOB elevated     General bed mobility comments: Assist for L LE.   Transfers Overall transfer level: Needs assistance Equipment used: Rolling walker (2 wheeled) Transfers: Sit to/from Stand Sit to Stand: Min assist         General transfer comment: Assist to rise, stabilize, control descent. VCs safety, technique, hand/LE placement  Ambulation/Gait Ambulation/Gait assistance: Min assist Ambulation Distance (Feet): 40 Feet Assistive device: Rolling walker (2 wheeled) Gait Pattern/deviations: Step-to pattern;Antalgic     General Gait Details: Assist to stabilize. VCs safety, sequence. Distance limited by pain and nausea  Stairs            Wheelchair Mobility    Modified Rankin (Stroke Patients Only)       Balance                                             Pertinent Vitals/Pain Pain Assessment: 0-10 Pain Score: 8  Pain Location: L knee Pain Descriptors / Indicators: Aching;Sore Pain Intervention(s): Limited activity within patient's tolerance;Repositioned;Ice applied     Home Living Family/patient expects to be discharged to:: Private residence Living Arrangements: Alone Available Help at Discharge: Family Type of Home: House Home Access: Level entry     Home Layout: One level Home Equipment: Environmental consultant - 2 wheels      Prior Function Level of Independence: Independent               Hand Dominance        Extremity/Trunk Assessment   Upper Extremity Assessment Upper Extremity Assessment: Defer to OT evaluation    Lower Extremity Assessment Lower Extremity Assessment: Generalized weakness (s/p L TKA)    Cervical / Trunk Assessment Cervical / Trunk Assessment: Normal  Communication   Communication: No difficulties  Cognition Arousal/Alertness: Awake/alert Behavior During Therapy: WFL for tasks assessed/performed Overall Cognitive Status: Within Functional Limits for tasks assessed                                        General Comments      Exercises     Assessment/Plan    PT Assessment Patient needs continued PT services  PT Problem List Decreased strength;Decreased mobility;Decreased range of motion;Decreased activity tolerance;Decreased balance;Decreased knowledge of use of DME;Pain;Decreased knowledge of precautions       PT Treatment Interventions DME instruction;Therapeutic activities;Gait training;Therapeutic exercise;Patient/family education;Stair training;Balance training;Functional mobility training  PT Goals (Current goals can be found in the Care Plan section)  Acute Rehab PT Goals Patient Stated Goal: less pain. regain independence PT Goal Formulation: With patient Time For Goal Achievement: 05/18/17 Potential to Achieve Goals: Good    Frequency 7X/week   Barriers to discharge        Co-evaluation               AM-PAC PT "6 Clicks" Daily Activity  Outcome Measure Difficulty turning over in bed (including adjusting bedclothes, sheets and blankets)?: A Lot Difficulty moving  from lying on back to sitting on the side of the bed? : Total Difficulty sitting down on and standing up from a chair with arms (e.g., wheelchair, bedside commode, etc,.)?: Total Help needed moving to and from a bed to chair (including a wheelchair)?: A Little Help needed walking in hospital room?: A Little Help needed climbing 3-5 steps with a railing? : A Lot 6 Click Score: 12    End of Session Equipment Utilized During Treatment: Gait belt Activity Tolerance: Patient limited by pain (limited by nausea/vomiting) Patient left: in chair;with call bell/phone within reach   PT Visit Diagnosis: Muscle weakness (generalized) (M62.81);Difficulty in walking, not elsewhere classified (R26.2)    Time: 8184-0375 PT Time Calculation (min) (ACUTE ONLY): 12 min   Charges:   PT Evaluation $PT Eval Low Complexity: 1 Procedure     PT G Codes:          Weston Anna, MPT Pager: [phone]

## 2017-05-04 NOTE — Transfer of Care (Signed)
Immediate Anesthesia Transfer of Care Note  Patient: Stephanie Daniel  Procedure(s) Performed: Procedure(s) with comments: LEFT TOTAL KNEE ARTHROPLASTY (Left) - 90 mins  Patient Location: PACU  Anesthesia Type:Spinal  Level of Consciousness:  sedated, patient cooperative and responds to stimulation  Airway & Oxygen Therapy:Patient Spontanous Breathing and Patient connected to face mask oxgen  Post-op Assessment:  Report given to PACU RN and Post -op Vital signs reviewed and stable  Post vital signs:  Reviewed and stable  Last Vitals:  Vitals:   05/04/17 0930 05/04/17 0932  BP:    Pulse: 64 65  Resp: 13 19  Temp:      Complications: No apparent anesthesia complications

## 2017-05-04 NOTE — Anesthesia Preprocedure Evaluation (Addendum)
Anesthesia Evaluation  Patient identified by MRN, date of birth, ID band Patient awake    Reviewed: Allergy & Precautions, NPO status , Patient's Chart, lab work & pertinent test results  Airway Mallampati: II  TM Distance: >3 FB Neck ROM: Full    Dental no notable dental hx.    Pulmonary former smoker,    Pulmonary exam normal breath sounds clear to auscultation       Cardiovascular hypertension, Pt. on medications Normal cardiovascular exam Rhythm:Regular Rate:Normal  ECG: NSR, incomplete RBBB   Neuro/Psych negative neurological ROS  negative psych ROS   GI/Hepatic Neg liver ROS, GERD  Medicated and Controlled,  Endo/Other  Hypothyroidism   Renal/GU negative Renal ROS  negative genitourinary   Musculoskeletal negative musculoskeletal ROS (+)   Abdominal (+) + obese,   Peds negative pediatric ROS (+)  Hematology negative hematology ROS (+)   Anesthesia Other Findings obese  Reproductive/Obstetrics negative OB ROS                            Anesthesia Physical Anesthesia Plan  ASA: III  Anesthesia Plan: Spinal and Regional   Post-op Pain Management:  Regional for Post-op pain   Induction: Intravenous  PONV Risk Score and Plan: 2 and Ondansetron, Dexamethasone, Propofol and Midazolam  Airway Management Planned:   Additional Equipment:   Intra-op Plan:   Post-operative Plan:   Informed Consent: I have reviewed the patients History and Physical, chart, labs and discussed the procedure including the risks, benefits and alternatives for the proposed anesthesia with the patient or authorized representative who has indicated his/her understanding and acceptance.   Dental advisory given  Plan Discussed with: CRNA  Anesthesia Plan Comments:         Anesthesia Quick Evaluation

## 2017-05-04 NOTE — Anesthesia Procedure Notes (Signed)
Spinal  Patient location during procedure: OR Start time: 05/04/2017 9:44 AM End time: 05/04/2017 9:46 AM Staffing Anesthesiologist: Adele Barthel P Resident/CRNA: Claudia Desanctis Performed: resident/CRNA  Preanesthetic Checklist Completed: patient identified, site marked, surgical consent, pre-op evaluation, timeout performed, IV checked, risks and benefits discussed and monitors and equipment checked Spinal Block Patient position: sitting Prep: ChloraPrep Patient monitoring: heart rate, continuous pulse ox and blood pressure Location: L3-4 Injection technique: single-shot Needle Needle type: Pencan  Needle gauge: 24 G Needle length: 10 cm Needle insertion depth: 7 cm Assessment Sensory level: T6

## 2017-05-04 NOTE — Progress Notes (Signed)
Assisted Dr. Ellender with left, ultrasound guided, adductor canal block. Side rails up, monitors on throughout procedure. See vital signs in flow sheet. Tolerated Procedure well.  

## 2017-05-04 NOTE — Anesthesia Procedure Notes (Signed)
Anesthesia Regional Block: Adductor canal block   Pre-Anesthetic Checklist: ,, timeout performed, Correct Patient, Correct Site, Correct Laterality, Correct Procedure,, site marked, risks and benefits discussed, Surgical consent,  Pre-op evaluation,  At surgeon's request and post-op pain management  Laterality: Left  Prep: chloraprep       Needles:  Injection technique: Single-shot  Needle Type: Echogenic Stimulator Needle     Needle Length: 9cm  Needle Gauge: 21     Additional Needles:   Procedures: ultrasound guided,,,,,,,,  Narrative:  Start time: 05/04/2017 9:05 AM End time: 05/04/2017 9:15 AM Injection made incrementally with aspirations every 5 mL.  Performed by: Personally  Anesthesiologist: Adele Barthel P  Additional Notes: Functioning IV was confirmed and monitors were applied.  A 52mm 21ga Arrow echogenic stimulator needle was used. Sterile prep and drape,hand hygiene and sterile gloves were used.  Negative aspiration and negative test dose prior to incremental administration of local anesthetic. The patient tolerated the procedure well.

## 2017-05-04 NOTE — Anesthesia Postprocedure Evaluation (Signed)
Anesthesia Post Note  Patient: BRENYA TAULBEE  Procedure(s) Performed: Procedure(s) (LRB): LEFT TOTAL KNEE ARTHROPLASTY (Left)     Patient location during evaluation: PACU Anesthesia Type: Regional and Spinal Level of consciousness: oriented and awake and alert Pain management: pain level controlled Vital Signs Assessment: post-procedure vital signs reviewed and stable Respiratory status: spontaneous breathing, respiratory function stable and patient connected to nasal cannula oxygen Cardiovascular status: blood pressure returned to baseline and stable Postop Assessment: no headache and no backache Anesthetic complications: no    Last Vitals:  Vitals:   05/04/17 1429 05/04/17 1517  BP: (!) 150/66 110/68  Pulse: 68 69  Resp: 16 16  Temp: 36.3 C 36.3 C    Last Pain:  Vitals:   05/04/17 1527  TempSrc:   PainSc: 2                  Ryan P Ellender

## 2017-05-04 NOTE — Discharge Instructions (Addendum)

## 2017-05-05 ENCOUNTER — Encounter (HOSPITAL_COMMUNITY): Payer: Self-pay | Admitting: Orthopedic Surgery

## 2017-05-05 LAB — CBC
HCT: 36.8 % (ref 36.0–46.0)
Hemoglobin: 11.9 g/dL — ABNORMAL LOW (ref 12.0–15.0)
MCH: 31 pg (ref 26.0–34.0)
MCHC: 32.3 g/dL (ref 30.0–36.0)
MCV: 95.8 fL (ref 78.0–100.0)
PLATELETS: 191 10*3/uL (ref 150–400)
RBC: 3.84 MIL/uL — ABNORMAL LOW (ref 3.87–5.11)
RDW: 15 % (ref 11.5–15.5)
WBC: 6.1 10*3/uL (ref 4.0–10.5)

## 2017-05-05 LAB — BASIC METABOLIC PANEL
Anion gap: 5 (ref 5–15)
BUN: 13 mg/dL (ref 6–20)
CALCIUM: 8.1 mg/dL — AB (ref 8.9–10.3)
CO2: 28 mmol/L (ref 22–32)
CREATININE: 0.93 mg/dL (ref 0.44–1.00)
Chloride: 109 mmol/L (ref 101–111)
GFR calc Af Amer: >60 mL/min (ref >60)
GLUCOSE: 98 mg/dL (ref 65–99)
Potassium: 4.1 mmol/L (ref 3.5–5.1)
Sodium: 142 mmol/L (ref 135–145)

## 2017-05-05 NOTE — Progress Notes (Signed)
Patient ID: Stephanie Daniel, female   DOB: [birth year], 66 y.o.   MRN: [id number] Subjective: 1 Day Post-Op Procedure(s) (LRB): LEFT TOTAL KNEE ARTHROPLASTY (Left)    Patient reports pain as moderate.  Working on pain control with intention of home discharge.  PON  Objective:   VITALS:   Vitals:   05/05/17 0057 05/05/17 0458  BP: (!) 116/49 128/69  Pulse: 69 64  Resp: 17 18  Temp: 97.5 F (36.4 C) 97.6 F (36.4 C)    Neurovascular intact Incision: dressing C/D/I  LABS  Recent Labs  05/05/17 0434  HGB 11.9*  HCT 36.8  WBC 6.1  PLT 191     Recent Labs  05/05/17 0434  NA 142  K 4.1  BUN 13  CREATININE 0.93  GLUCOSE 98    No results for input(s): LABPT, INR in the last 72 hours.   Assessment/Plan: 1 Day Post-Op Procedure(s) (LRB): LEFT TOTAL KNEE ARTHROPLASTY (Left)  Past Medical History:  Diagnosis Date  . Arthritis   . Hypertension   . Hypothyroidism   . Thyroid disease      Advance diet Up with therapy Plan for discharge tomorrow due to pain control and in house skilled PT prior to D/C home to avoid SNF dispo

## 2017-05-05 NOTE — Evaluation (Signed)
Occupational Therapy Evaluation Patient Details Name: CARIANA KARGE MRN: [id number] DOB: 1951/06/12 Today's Date: 05/05/2017    History of Present Illness 66 yo female s/p L TKA 05/04/17   Clinical Impression   Pt was admitted for the above sx. All education was completed. No further OT is needed at this time    Follow Up Recommendations  No OT follow up    Equipment Recommendations  None recommended by OT    Recommendations for Other Services       Precautions / Restrictions Precautions Precautions: Fall;Knee Restrictions Weight Bearing Restrictions: No LLE Weight Bearing: Weight bearing as tolerated      Mobility Bed Mobility         Supine to sit: Min guard     General bed mobility comments: pt used arms to assist LLE  Transfers   Equipment used: Rolling walker (2 wheeled) Transfers: Sit to/from Stand Sit to Stand: Min guard         General transfer comment: for safety. Cues for UE/LE placement    Balance                                           ADL either performed or assessed with clinical judgement   ADL Overall ADL's : Needs assistance/impaired Eating/Feeding: Independent   Grooming: Oral care;Supervision/safety;Standing   Upper Body Bathing: Set up;Sitting   Lower Body Bathing: Minimal assistance;Sit to/from stand   Upper Body Dressing : Set up;Sitting   Lower Body Dressing: Minimal assistance;Sit to/from stand   Toilet Transfer: Min guard;Comfort height toilet;RW;Ambulation   Toileting- Water quality scientist and Hygiene: Min guard;Sit to/from stand         General ADL Comments: reviewed shower options with tub. She may be able to borrow a tub bench, or else she will sponge bathe initially.  Reviewed precautions and general safety.  Pt was able to get on/off comfort height commode with RW safely     Vision         Perception     Praxis      Pertinent Vitals/Pain Pain Score: 4  Pain Location: L  knee Pain Descriptors / Indicators: Aching;Sore Pain Intervention(s): Limited activity within patient's tolerance;Monitored during session;Premedicated before session;Repositioned;Ice applied     Hand Dominance     Extremity/Trunk Assessment Upper Extremity Assessment Upper Extremity Assessment: Overall WFL for tasks assessed           Communication Communication Communication: No difficulties   Cognition Arousal/Alertness: Awake/alert Behavior During Therapy: WFL for tasks assessed/performed Overall Cognitive Status: Within Functional Limits for tasks assessed                                     General Comments       Exercises     Shoulder Instructions      Home Living Family/patient expects to be discharged to:: Private residence Living Arrangements: Alone Available Help at Discharge: Friend(s) Type of Home: House             Bathroom Shower/Tub: Teaching laboratory technician Toilet: Handicapped height     Home Equipment: Environmental consultant - 2 wheels          Prior Functioning/Environment Level of Independence: Independent  OT Problem List:        OT Treatment/Interventions:      OT Goals(Current goals can be found in the care plan section) Acute Rehab OT Goals OT Goal Formulation: All assessment and education complete, DC therapy  OT Frequency:     Barriers to D/C:            Co-evaluation              AM-PAC PT "6 Clicks" Daily Activity     Outcome Measure Help from another person eating meals?: None Help from another person taking care of personal grooming?: A Little Help from another person toileting, which includes using toliet, bedpan, or urinal?: A Little Help from another person bathing (including washing, rinsing, drying)?: A Little Help from another person to put on and taking off regular upper body clothing?: A Little Help from another person to put on and taking off regular lower body clothing?: A  Little 6 Click Score: 19   End of Session    Activity Tolerance: Patient tolerated treatment well Patient left: in chair;with call bell/phone within reach  OT Visit Diagnosis: Pain Pain - Right/Left: Left Pain - part of body: Knee                Time: 1638-4536 OT Time Calculation (min): 19 min Charges:  OT General Charges $OT Visit: 1 Procedure OT Evaluation $OT Eval Low Complexity: 1 Procedure G-Codes:     [place], OTR/L [phone] 05/05/2017  Obrian Bulson 05/05/2017, 8:28 AM

## 2017-05-05 NOTE — Progress Notes (Signed)
Physical Therapy Treatment Patient Details Name: Stephanie Daniel MRN: [id number] DOB: 04/15/1951 Today's Date: 05/05/2017    History of Present Illness 66 yo female s/p L TKA 05/04/17    PT Comments    Progressing with mobility.    Follow Up Recommendations  DC plan and follow up therapy as arranged by surgeon (virtual PT)     Equipment Recommendations       Recommendations for Other Services       Precautions / Restrictions Precautions Precautions: Fall;Knee Restrictions Weight Bearing Restrictions: No LLE Weight Bearing: Weight bearing as tolerated    Mobility  Bed Mobility         Supine to sit: Min guard     General bed mobility comments: oob in recliner  Transfers Overall transfer level: Needs assistance Equipment used: Rolling walker (2 wheeled) Transfers: Sit to/from Stand Sit to Stand: Min guard         General transfer comment: for safety. Cues for UE/LE placement  Ambulation/Gait Ambulation/Gait assistance: Min guard Ambulation Distance (Feet): 75 Feet Assistive device: Rolling walker (2 wheeled) Gait Pattern/deviations: Step-to pattern;Step-through pattern;Decreased stride length     General Gait Details: close guard for safety. Pt tends to alternate between step to pattern and step throught pattern.    Stairs            Wheelchair Mobility    Modified Rankin (Stroke Patients Only)       Balance                                            Cognition Arousal/Alertness: Awake/alert Behavior During Therapy: WFL for tasks assessed/performed Overall Cognitive Status: Within Functional Limits for tasks assessed                                        Exercises Total Joint Exercises Ankle Circles/Pumps: AROM;Both;10 reps;Supine Quad Sets: AROM;Both;10 reps;Supine Hip ABduction/ADduction: AROM;Left;10 reps;Supine Straight Leg Raises: AROM;Left;10 reps;Supine Knee Flexion: AAROM;Left;10  reps;Seated Goniometric ROM: ~0-80 degrees    General Comments        Pertinent Vitals/Pain Pain Assessment: 0-10 Pain Score: 7  Pain Location: L knee Pain Descriptors / Indicators: Aching;Sore Pain Intervention(s): Patient requesting pain meds-RN notified    Home Living Family/patient expects to be discharged to:: Private residence Living Arrangements: Alone Available Help at Discharge: Friend(s) Type of Home: House       Home Equipment: Environmental consultant - 2 wheels      Prior Function Level of Independence: Independent          PT Goals (current goals can now be found in the care plan section) Progress towards PT goals: Progressing toward goals    Frequency    7X/week      PT Plan Current plan remains appropriate    Co-evaluation              AM-PAC PT "6 Clicks" Daily Activity  Outcome Measure  Difficulty turning over in bed (including adjusting bedclothes, sheets and blankets)?: A Lot Difficulty moving from lying on back to sitting on the side of the bed? : A Lot Difficulty sitting down on and standing up from a chair with arms (e.g., wheelchair, bedside commode, etc,.)?: A Little Help needed moving to and from a bed to chair (including  a wheelchair)?: A Little Help needed walking in hospital room?: A Little Help needed climbing 3-5 steps with a railing? : A Little 6 Click Score: 16    End of Session Equipment Utilized During Treatment: Gait belt Activity Tolerance: Patient tolerated treatment well Patient left: in chair;with call bell/phone within reach   PT Visit Diagnosis: Muscle weakness (generalized) (M62.81);Difficulty in walking, not elsewhere classified (R26.2)     Time: 1499-6924 PT Time Calculation (min) (ACUTE ONLY): 17 min  Charges:  $Gait Training: 8-22 mins                    G Codes:         Weston Anna, MPT Pager: [phone]

## 2017-05-05 NOTE — Progress Notes (Signed)
Physical Therapy Treatment Patient Details Name: AISHA GREENBERGER MRN: [id number] DOB: 11/11/1951 Today's Date: 05/05/2017    History of Present Illness 66 yo female s/p L TKA 05/04/17    PT Comments    Progressing with mobility. Plan is for d/c home on tomorrow.    Follow Up Recommendations  DC plan and follow up therapy as arranged by surgeon (virtual PT)     Equipment Recommendations  None recommended by PT    Recommendations for Other Services       Precautions / Restrictions Precautions Precautions: Fall;Knee Restrictions Weight Bearing Restrictions: No LLE Weight Bearing: Weight bearing as tolerated    Mobility  Bed Mobility Overal bed mobility: Needs Assistance Bed Mobility: Sit to Supine       Sit to supine: Min assist   General bed mobility comments: small amount of assist for L LE back onto bed.   Transfers Overall transfer level: Needs assistance Equipment used: Rolling walker (2 wheeled) Transfers: Sit to/from Stand Sit to Stand: Min guard         General transfer comment: for safety. Cues for UE/LE placement  Ambulation/Gait Ambulation/Gait assistance: Min guard Ambulation Distance (Feet): 100 Feet Assistive device: Rolling walker (2 wheeled) Gait Pattern/deviations: Step-to pattern;Step-through pattern;Decreased stride length     General Gait Details: close guard for safety. Pt tends to alternate between step to pattern and step throught pattern.    Stairs            Wheelchair Mobility    Modified Rankin (Stroke Patients Only)       Balance                                            Cognition Arousal/Alertness: Awake/alert Behavior During Therapy: WFL for tasks assessed/performed Overall Cognitive Status: Within Functional Limits for tasks assessed                                        Exercises    General Comments        Pertinent Vitals/Pain Pain Assessment: 0-10 Pain Score:  7  Pain Location: L knee Pain Descriptors / Indicators: Aching;Sore Pain Intervention(s): Limited activity within patient's tolerance;Repositioned;Ice applied    Home Living                      Prior Function            PT Goals (current goals can now be found in the care plan section) Progress towards PT goals: Progressing toward goals    Frequency    7X/week      PT Plan Current plan remains appropriate    Co-evaluation              AM-PAC PT "6 Clicks" Daily Activity  Outcome Measure  Difficulty turning over in bed (including adjusting bedclothes, sheets and blankets)?: A Little Difficulty moving from lying on back to sitting on the side of the bed? : A Little Difficulty sitting down on and standing up from a chair with arms (e.g., wheelchair, bedside commode, etc,.)?: A Little Help needed moving to and from a bed to chair (including a wheelchair)?: A Little Help needed walking in hospital room?: A Little Help needed climbing 3-5 steps with a railing? : A  Little 6 Click Score: 18    End of Session Equipment Utilized During Treatment: Gait belt Activity Tolerance: Patient tolerated treatment well Patient left: in bed;with call bell/phone within reach   PT Visit Diagnosis: Muscle weakness (generalized) (M62.81);Difficulty in walking, not elsewhere classified (R26.2)     Time: 8366-2947 PT Time Calculation (min) (ACUTE ONLY): 8 min  Charges:  $Gait Training: 8-22 mins                    G Codes:          Weston Anna, MPT Pager: [phone]

## 2017-05-05 NOTE — Care Management Note (Signed)
Case Management Note  Patient Details  Name: Stephanie Daniel MRN: [id number] Date of Birth: 1951/05/09  Subjective/Objective:  66 y/o f admitted w/OA l knee. S/p L TKA. From home. Has dme. D/c plan virtual PT. No CM needs.                  Action/Plan:d/c home.   Expected Discharge Date:                  Expected Discharge Plan:  Home/Self Care  In-House Referral:     Discharge planning Services  CM Consult  Post Acute Care Choice:    Choice offered to:     DME Arranged:    DME Agency:     HH Arranged:    [place] Agency:     Status of Service:  Completed, signed off  If discussed at [employer] of Stay Meetings, dates discussed:    Additional Comments:  Dessa Phi, RN 05/05/2017, 1:46 PM

## 2017-05-06 DIAGNOSIS — E669 Obesity, unspecified: Secondary | ICD-10-CM | POA: Diagnosis present

## 2017-05-06 LAB — CBC
HEMATOCRIT: 35.1 % — AB (ref 36.0–46.0)
Hemoglobin: 11.7 g/dL — ABNORMAL LOW (ref 12.0–15.0)
MCH: 31 pg (ref 26.0–34.0)
MCHC: 33.3 g/dL (ref 30.0–36.0)
MCV: 93.1 fL (ref 78.0–100.0)
Platelets: 199 10*3/uL (ref 150–400)
RBC: 3.77 MIL/uL — ABNORMAL LOW (ref 3.87–5.11)
RDW: 14.7 % (ref 11.5–15.5)
WBC: 9.7 10*3/uL (ref 4.0–10.5)

## 2017-05-06 LAB — BASIC METABOLIC PANEL
ANION GAP: 10 (ref 5–15)
BUN: 15 mg/dL (ref 6–20)
CHLORIDE: 105 mmol/L (ref 101–111)
CO2: 23 mmol/L (ref 22–32)
Calcium: 8.5 mg/dL — ABNORMAL LOW (ref 8.9–10.3)
Creatinine, Ser: 0.84 mg/dL (ref 0.44–1.00)
GFR calc Af Amer: >60 mL/min (ref >60)
GFR calc non Af Amer: >60 mL/min (ref >60)
GLUCOSE: 96 mg/dL (ref 65–99)
POTASSIUM: 4.1 mmol/L (ref 3.5–5.1)
Sodium: 138 mmol/L (ref 135–145)

## 2017-05-06 MED ORDER — ONDANSETRON HCL 4 MG PO TABS: 4.0000 mg | ORAL_TABLET | Freq: Four times a day (QID) | ORAL | 0 refills | Status: DC | PRN

## 2017-05-06 MED ORDER — RIVAROXABAN 10 MG PO TABS: 10.0000 mg | ORAL_TABLET | Freq: Every day | ORAL | 0 refills | Status: DC

## 2017-05-06 MED ORDER — METHOCARBAMOL 500 MG PO TABS: 500.0000 mg | ORAL_TABLET | Freq: Four times a day (QID) | ORAL | 0 refills | Status: DC | PRN

## 2017-05-06 MED ORDER — ASPIRIN 81 MG PO CHEW: 81.0000 mg | CHEWABLE_TABLET | Freq: Two times a day (BID) | ORAL | 0 refills | Status: AC

## 2017-05-06 MED ORDER — CELECOXIB 200 MG PO CAPS: 200.0000 mg | ORAL_CAPSULE | Freq: Two times a day (BID) | ORAL | 0 refills | Status: DC

## 2017-05-06 MED ORDER — HYDROCODONE-ACETAMINOPHEN 7.5-325 MG PO TABS: 1.0000 | ORAL_TABLET | ORAL | 0 refills | Status: DC | PRN

## 2017-05-06 NOTE — Progress Notes (Signed)
Physical Therapy Treatment Patient Details Name: Stephanie Daniel MRN: [id number] DOB: 1950-11-25 Today's Date: 05/06/2017    History of Present Illness 65 yo female s/p L TKA 05/04/17    PT Comments    Progressing well with mobility. Pt experienced some nausea during and after ambulation on today-made RN aware. Practiced/reviewed gait training and exercises. All education completed. Ready to d/c from PT standpoint.    Follow Up Recommendations  DC plan and follow up therapy as arranged by surgeon (virtual PT)     Equipment Recommendations  None recommended by PT    Recommendations for Other Services       Precautions / Restrictions Precautions Precautions: Fall;Knee Restrictions Weight Bearing Restrictions: No LLE Weight Bearing: Weight bearing as tolerated    Mobility  Bed Mobility               General bed mobility comments: oob in recliner  Transfers Overall transfer level: Needs assistance Equipment used: Rolling walker (2 wheeled) Transfers: Sit to/from Stand Sit to Stand: Min guard         General transfer comment: for safety. Cues for UE/LE placement  Ambulation/Gait Ambulation/Gait assistance: Min guard Ambulation Distance (Feet): 125 Feet Assistive device: Rolling walker (2 wheeled) Gait Pattern/deviations: Step-to pattern;Step-through pattern;Decreased stride length     General Gait Details: close guard for safety. Pt tends to alternate between step to pattern and step through pattern. Pt c/o nausea during ambulation   Stairs            Wheelchair Mobility    Modified Rankin (Stroke Patients Only)       Balance                                            Cognition Arousal/Alertness: Awake/alert Behavior During Therapy: WFL for tasks assessed/performed Overall Cognitive Status: Within Functional Limits for tasks assessed                                        Exercises Total Joint  Exercises Quad Sets: AROM;Both;10 reps;Supine Hip ABduction/ADduction: AROM;Left;10 reps;Supine Straight Leg Raises: AROM;Left;10 reps;Supine Long Arc Quad: 5 reps;Left;AROM;Seated Knee Flexion: AAROM;Left;10 reps;Seated Goniometric ROM: ~0-65 degrees (limited due to increased pain  today)    General Comments        Pertinent Vitals/Pain Pain Assessment: 0-10 Pain Score: 7  Pain Location: L knee Pain Descriptors / Indicators: Aching;Sore Pain Intervention(s): Ice applied;Monitored during session    Home Living                      Prior Function            PT Goals (current goals can now be found in the care plan section) Progress towards PT goals: Progressing toward goals    Frequency    7X/week      PT Plan Current plan remains appropriate    Co-evaluation              AM-PAC PT "6 Clicks" Daily Activity  Outcome Measure  Difficulty turning over in bed (including adjusting bedclothes, sheets and blankets)?: A Little Difficulty moving from lying on back to sitting on the side of the bed? : A Little Difficulty sitting down on and standing up from a chair with  arms (e.g., wheelchair, bedside commode, etc,.)?: A Little Help needed moving to and from a bed to chair (including a wheelchair)?: A Little Help needed walking in hospital room?: A Little Help needed climbing 3-5 steps with a railing? : A Little 6 Click Score: 18    End of Session Equipment Utilized During Treatment: Gait belt Activity Tolerance: Patient tolerated treatment well Patient left: in chair;with call bell/phone within reach;with family/visitor present   PT Visit Diagnosis: Muscle weakness (generalized) (M62.81);Difficulty in walking, not elsewhere classified (R26.2)     Time: 1901-2224 PT Time Calculation (min) (ACUTE ONLY): 17 min  Charges:  $Gait Training: 8-22 mins                    G Codes:          Weston Anna, MPT Pager: [phone]

## 2017-05-06 NOTE — Progress Notes (Signed)
     Subjective: 2 Days Post-Op Procedure(s) (LRB): LEFT TOTAL KNEE ARTHROPLASTY (Left)   Patient reports pain as mild, pain controlled. No events throughout the night. Feels that she is working well with PT. Ready to be discharged home.   Objective:   VITALS:   Vitals:   05/05/17 2224 05/06/17 0534  BP: (!) 163/73 (!) 147/89  Pulse: 73 73  Resp: 16 15  Temp: 98.1 F (36.7 C) 98.6 F (37 C)    Dorsiflexion/Plantar flexion intact Incision: dressing C/D/I No cellulitis present Compartment soft  LABS  Recent Labs  05/05/17 0434 05/06/17 0456  HGB 11.9* 11.7*  HCT 36.8 35.1*  WBC 6.1 9.7  PLT 191 199     Recent Labs  05/05/17 0434 05/06/17 0456  NA 142 138  K 4.1 4.1  BUN 13 15  CREATININE 0.93 0.84  GLUCOSE 98 96     Assessment/Plan: 2 Days Post-Op Procedure(s) (LRB): LEFT TOTAL KNEE ARTHROPLASTY (Left) Up with therapy Discharge home Follow up in 2 weeks at [facility]. Follow up with OLIN,Lin Hackmann D in 2 weeks.  Contact information:  [facility] [address], [place] [phone]    Obese (BMI 30-39.9) Estimated body mass index is 30.21 kg/m as calculated from the following:   Height as of this encounter: 5\' 4"  (1.626 m).   Weight as of this encounter: 79.8 kg (176 lb). Patient also counseled that weight may inhibit the healing process Patient counseled that losing weight will help with future health issues       West Pugh. Akil Hoos   PAC  05/06/2017, 9:33 AM

## 2017-05-11 NOTE — Discharge Summary (Signed)
Physician Discharge Summary  Patient ID: Stephanie Daniel MRN: [id number] DOB/AGE: [birth year] 68 y.o.  Admit date: 05/04/2017 Discharge date: 05/06/2017   Procedures:  Procedure(s) (LRB): LEFT TOTAL KNEE ARTHROPLASTY (Left)  Attending Physician:  Dr. Paralee Cancel   Admission Diagnoses:   Left knee primary OA / pain  Discharge Diagnoses:  Principal Problem:   S/P left TKA Active Problems:   Obese  Past Medical History:  Diagnosis Date  . Arthritis   . Hypertension   . Hypothyroidism   . Thyroid disease     HPI:    Stephanie Daniel, 66 y.o. female, has a history of pain and functional disability in the left knee due to arthritis and has failed non-surgical conservative treatments for greater than 12 weeks to include NSAID's and/or analgesics, corticosteriod injections, viscosupplementation injections and activity modification.  Onset of symptoms was gradual, starting ~3 years ago with gradually worsening course since that time. The patient noted prior procedures on the knee to include  arthroscopy and menisectomy on the left knee(s).  Patient currently rates pain in the left knee(s) at 10 out of 10 with activity. Patient has worsening of pain with activity and weight bearing, pain that interferes with activities of daily living, pain with passive range of motion, crepitus and joint swelling.  Patient has evidence of periarticular osteophytes and joint space narrowing by imaging studies.  There is no active infection.   Risks, benefits and expectations were discussed with the patient.  Risks including but not limited to the risk of anesthesia, blood clots, nerve damage, blood vessel damage, failure of the prosthesis, infection and up to and including death.  Patient understand the risks, benefits and expectations and wishes to proceed with surgery.   PCP: Kelton Pillar, MD   Discharged Condition: good  Hospital Course:  Patient underwent the above stated procedure on 05/04/2017.  Patient tolerated the procedure well and brought to the recovery room in good condition and subsequently to the floor.  POD #1 BP: 128/69 ; Pulse: 64 ; Temp: 97.6 F (36.4 C) ; Resp: 18 Patient reports pain as moderate.  Working on pain control with intention of home discharge.  PON. Neurovascular intact and incision: dressing C/D/I.  LABS  Basename    HGB     11.9  HCT     36.8   POD #2  BP: 147/89 ; Pulse: 73 ; Temp: 98.6 F (37 C) ; Resp: 15 Patient reports pain as mild, pain controlled. No events throughout the night. Feels that she is working well with PT. Ready to be discharged home.  Dorsiflexion/plantar flexion intact, incision: dressing C/D/I, no cellulitis present and compartment soft.   LABS  Basename    HGB     11.7  HCT     35.1    Discharge Exam: General appearance: alert, cooperative and no distress Extremities: Homans sign is negative, no sign of DVT, no edema, redness or tenderness in the calves or thighs and no ulcers, gangrene or trophic changes  Disposition: Home with follow up in 2 weeks   Follow-up Information    Paralee Cancel, MD. Schedule an appointment as soon as possible for a visit in 2 week(s).   Specialty:  Orthopedic Surgery Contact information: [address] [place] [address] [phone]           Discharge Instructions    Call MD / Call 911    Complete by:  As directed    If you experience chest  pain or shortness of breath, CALL 911 and be transported to the hospital emergency room.  If you develope a fever above 101 F, pus (white drainage) or increased drainage or redness at the wound, or calf pain, call your surgeon's office.   Change dressing    Complete by:  As directed    Maintain surgical dressing until follow up in the clinic. If the edges start to pull up, may reinforce with tape. If the dressing is no longer working, may remove and cover with gauze and tape, but must keep the area dry and clean.  Call  with any questions or concerns.   Constipation Prevention    Complete by:  As directed    Drink plenty of fluids.  Prune juice may be helpful.  You may use a stool softener, such as Colace (over the counter) 100 mg twice a day.  Use MiraLax (over the counter) for constipation as needed.   Diet - low sodium heart healthy    Complete by:  As directed    Discharge instructions    Complete by:  As directed    Maintain surgical dressing until follow up in the clinic. If the edges start to pull up, may reinforce with tape. If the dressing is no longer working, may remove and cover with gauze and tape, but must keep the area dry and clean.  Follow up in 2 weeks at [facility]. Call with any questions or concerns.   Increase activity slowly as tolerated    Complete by:  As directed    Weight bearing as tolerated with assist device (walker, cane, etc) as directed, use it as long as suggested by your surgeon or therapist, typically at least 4-6 weeks.   TED hose    Complete by:  As directed    Use stockings (TED hose) for 2 weeks on both leg(s).  You may remove them at night for sleeping.      Allergies as of 05/06/2017   No Known Allergies     Medication List    STOP taking these medications   ibuprofen 200 MG tablet Commonly known as:  ADVIL,MOTRIN   meloxicam 7.5 MG tablet Commonly known as:  MOBIC     TAKE these medications   acyclovir 400 MG tablet Commonly known as:  ZOVIRAX Take 400 mg by mouth 2 (two) times daily.   aspirin 81 MG chewable tablet Chew 1 tablet (81 mg total) by mouth 2 (two) times daily. Start the day after finishing the Xarelto, take for 4 weeks. Start taking on:  05/20/2017   butalbital-acetaminophen-caffeine 50-325-40-30 MG capsule Commonly known as:  FIORICET WITH CODEINE Take 1 capsule by mouth every 6 (six) hours as needed. For migraine headaches   CALCIUM 600+D 600-800 MG-UNIT Tabs Generic drug:  Calcium Carb-Cholecalciferol Take 1 tablet by  mouth daily.   celecoxib 200 MG capsule Commonly known as:  CELEBREX Take 1 capsule (200 mg total) by mouth every 12 (twelve) hours.   docusate sodium 100 MG capsule Commonly known as:  COLACE Take 1 capsule (100 mg total) by mouth 2 (two) times daily.   ferrous sulfate 325 (65 FE) MG tablet Commonly known as:  FERROUSUL Take 1 tablet (325 mg total) by mouth 3 (three) times daily with meals.   fish oil-omega-3 fatty acids 1000 MG capsule Take 2,000 mg by mouth daily.   gabapentin 300 MG capsule Commonly known as:  NEURONTIN Take 300 mg by mouth at bedtime.   GLUCOSAMINE-CHONDROITIN-MSM-D3 PO  Take 1 tablet by mouth daily.   hydrochlorothiazide 12.5 MG capsule Commonly known as:  MICROZIDE Take 12.5 mg by mouth daily.   HYDROcodone-acetaminophen 7.5-325 MG tablet Commonly known as:  NORCO Take 1-2 tablets by mouth every 4 (four) hours as needed for moderate pain or severe pain.   levothyroxine 50 MCG tablet Commonly known as:  SYNTHROID, LEVOTHROID Take 50 mcg by mouth daily before breakfast.   lisinopril 10 MG tablet Commonly known as:  PRINIVIL,ZESTRIL Take 10 mg by mouth daily.   methocarbamol 500 MG tablet Commonly known as:  ROBAXIN Take 1 tablet (500 mg total) by mouth every 6 (six) hours as needed for muscle spasms.   omeprazole 40 MG capsule Commonly known as:  PRILOSEC Take 40 mg by mouth daily.   ondansetron 4 MG tablet Commonly known as:  ZOFRAN Take 1 tablet (4 mg total) by mouth every 6 (six) hours as needed for nausea.   polyethylene glycol packet Commonly known as:  MIRALAX / GLYCOLAX Take 17 g by mouth 2 (two) times daily.   rivaroxaban 10 MG Tabs tablet Commonly known as:  XARELTO Take 1 tablet (10 mg total) by mouth daily.   SUMAtriptan 100 MG tablet Commonly known as:  IMITREX Take 100 mg by mouth 2 (two) times daily as needed. For migraine headaches.        Signed: West Pugh. Dayrin Stallone   PA-C  05/11/2017, 4:49 PM

## 2017-05-15 DIAGNOSIS — Z471 Aftercare following joint replacement surgery: Secondary | ICD-10-CM | POA: Diagnosis not present

## 2017-05-15 DIAGNOSIS — Z96652 Presence of left artificial knee joint: Secondary | ICD-10-CM | POA: Diagnosis not present

## 2017-06-03 DIAGNOSIS — N183 Chronic kidney disease, stage 3 (moderate): Secondary | ICD-10-CM | POA: Diagnosis not present

## 2017-06-03 DIAGNOSIS — E039 Hypothyroidism, unspecified: Secondary | ICD-10-CM | POA: Diagnosis not present

## 2017-06-03 DIAGNOSIS — I129 Hypertensive chronic kidney disease with stage 1 through stage 4 chronic kidney disease, or unspecified chronic kidney disease: Secondary | ICD-10-CM | POA: Diagnosis not present

## 2017-06-03 DIAGNOSIS — Z1211 Encounter for screening for malignant neoplasm of colon: Secondary | ICD-10-CM | POA: Diagnosis not present

## 2017-06-03 DIAGNOSIS — R7303 Prediabetes: Secondary | ICD-10-CM | POA: Diagnosis not present

## 2017-06-03 DIAGNOSIS — M1990 Unspecified osteoarthritis, unspecified site: Secondary | ICD-10-CM | POA: Diagnosis not present

## 2017-06-03 DIAGNOSIS — F39 Unspecified mood [affective] disorder: Secondary | ICD-10-CM | POA: Diagnosis not present

## 2017-06-11 DIAGNOSIS — Z1211 Encounter for screening for malignant neoplasm of colon: Secondary | ICD-10-CM | POA: Diagnosis not present

## 2017-06-12 DIAGNOSIS — Z471 Aftercare following joint replacement surgery: Secondary | ICD-10-CM | POA: Diagnosis not present

## 2017-06-12 DIAGNOSIS — Z96652 Presence of left artificial knee joint: Secondary | ICD-10-CM | POA: Diagnosis not present

## 2017-07-27 DIAGNOSIS — Z23 Encounter for immunization: Secondary | ICD-10-CM | POA: Diagnosis not present

## 2017-10-23 DIAGNOSIS — H2513 Age-related nuclear cataract, bilateral: Secondary | ICD-10-CM | POA: Diagnosis not present

## 2017-10-23 DIAGNOSIS — H52223 Regular astigmatism, bilateral: Secondary | ICD-10-CM | POA: Diagnosis not present

## 2017-10-23 DIAGNOSIS — H5201 Hypermetropia, right eye: Secondary | ICD-10-CM | POA: Diagnosis not present

## 2018-01-11 DIAGNOSIS — E039 Hypothyroidism, unspecified: Secondary | ICD-10-CM | POA: Diagnosis not present

## 2018-01-11 DIAGNOSIS — G43909 Migraine, unspecified, not intractable, without status migrainosus: Secondary | ICD-10-CM | POA: Diagnosis not present

## 2018-01-11 DIAGNOSIS — Z1389 Encounter for screening for other disorder: Secondary | ICD-10-CM | POA: Diagnosis not present

## 2018-01-11 DIAGNOSIS — Z1159 Encounter for screening for other viral diseases: Secondary | ICD-10-CM | POA: Diagnosis not present

## 2018-01-11 DIAGNOSIS — E049 Nontoxic goiter, unspecified: Secondary | ICD-10-CM | POA: Diagnosis not present

## 2018-01-11 DIAGNOSIS — N951 Menopausal and female climacteric states: Secondary | ICD-10-CM | POA: Diagnosis not present

## 2018-01-11 DIAGNOSIS — F39 Unspecified mood [affective] disorder: Secondary | ICD-10-CM | POA: Diagnosis not present

## 2018-01-11 DIAGNOSIS — M79643 Pain in unspecified hand: Secondary | ICD-10-CM | POA: Diagnosis not present

## 2018-01-11 DIAGNOSIS — E663 Overweight: Secondary | ICD-10-CM | POA: Diagnosis not present

## 2018-01-11 DIAGNOSIS — N183 Chronic kidney disease, stage 3 (moderate): Secondary | ICD-10-CM | POA: Diagnosis not present

## 2018-01-11 DIAGNOSIS — Z0000 Encounter for general adult medical examination without abnormal findings: Secondary | ICD-10-CM | POA: Diagnosis not present

## 2018-01-11 DIAGNOSIS — I129 Hypertensive chronic kidney disease with stage 1 through stage 4 chronic kidney disease, or unspecified chronic kidney disease: Secondary | ICD-10-CM | POA: Diagnosis not present

## 2018-01-11 DIAGNOSIS — A609 Anogenital herpesviral infection, unspecified: Secondary | ICD-10-CM | POA: Diagnosis not present

## 2018-01-11 DIAGNOSIS — J45909 Unspecified asthma, uncomplicated: Secondary | ICD-10-CM | POA: Diagnosis not present

## 2018-01-15 DIAGNOSIS — L570 Actinic keratosis: Secondary | ICD-10-CM | POA: Diagnosis not present

## 2018-01-15 DIAGNOSIS — Z85820 Personal history of malignant melanoma of skin: Secondary | ICD-10-CM | POA: Diagnosis not present

## 2018-01-15 DIAGNOSIS — L821 Other seborrheic keratosis: Secondary | ICD-10-CM | POA: Diagnosis not present

## 2018-01-15 DIAGNOSIS — L820 Inflamed seborrheic keratosis: Secondary | ICD-10-CM | POA: Diagnosis not present

## 2018-01-15 DIAGNOSIS — L814 Other melanin hyperpigmentation: Secondary | ICD-10-CM | POA: Diagnosis not present

## 2018-01-18 DIAGNOSIS — Z1231 Encounter for screening mammogram for malignant neoplasm of breast: Secondary | ICD-10-CM | POA: Diagnosis not present

## 2018-01-20 DIAGNOSIS — M79643 Pain in unspecified hand: Secondary | ICD-10-CM | POA: Diagnosis not present

## 2018-01-20 DIAGNOSIS — M1811 Unilateral primary osteoarthritis of first carpometacarpal joint, right hand: Secondary | ICD-10-CM | POA: Diagnosis not present

## 2018-01-20 DIAGNOSIS — M79642 Pain in left hand: Secondary | ICD-10-CM | POA: Diagnosis not present

## 2018-01-20 DIAGNOSIS — M25519 Pain in unspecified shoulder: Secondary | ICD-10-CM | POA: Diagnosis not present

## 2018-01-20 DIAGNOSIS — M79641 Pain in right hand: Secondary | ICD-10-CM | POA: Diagnosis not present

## 2018-01-20 DIAGNOSIS — M064 Inflammatory polyarthropathy: Secondary | ICD-10-CM | POA: Diagnosis not present

## 2018-01-20 DIAGNOSIS — M1990 Unspecified osteoarthritis, unspecified site: Secondary | ICD-10-CM | POA: Diagnosis not present

## 2018-01-20 DIAGNOSIS — M1812 Unilateral primary osteoarthritis of first carpometacarpal joint, left hand: Secondary | ICD-10-CM | POA: Diagnosis not present

## 2018-02-01 DIAGNOSIS — M1990 Unspecified osteoarthritis, unspecified site: Secondary | ICD-10-CM | POA: Diagnosis not present

## 2018-02-01 DIAGNOSIS — M79643 Pain in unspecified hand: Secondary | ICD-10-CM | POA: Diagnosis not present

## 2018-02-01 DIAGNOSIS — M25519 Pain in unspecified shoulder: Secondary | ICD-10-CM | POA: Diagnosis not present

## 2018-02-01 DIAGNOSIS — M064 Inflammatory polyarthropathy: Secondary | ICD-10-CM | POA: Diagnosis not present

## 2018-04-28 DIAGNOSIS — M1811 Unilateral primary osteoarthritis of first carpometacarpal joint, right hand: Secondary | ICD-10-CM | POA: Diagnosis not present

## 2018-04-28 DIAGNOSIS — M25511 Pain in right shoulder: Secondary | ICD-10-CM | POA: Diagnosis not present

## 2018-06-01 DIAGNOSIS — J209 Acute bronchitis, unspecified: Secondary | ICD-10-CM | POA: Diagnosis not present

## 2018-06-01 DIAGNOSIS — J019 Acute sinusitis, unspecified: Secondary | ICD-10-CM | POA: Diagnosis not present

## 2018-06-01 DIAGNOSIS — H6122 Impacted cerumen, left ear: Secondary | ICD-10-CM | POA: Diagnosis not present

## 2018-06-14 DIAGNOSIS — R609 Edema, unspecified: Secondary | ICD-10-CM | POA: Diagnosis not present

## 2018-06-14 DIAGNOSIS — I129 Hypertensive chronic kidney disease with stage 1 through stage 4 chronic kidney disease, or unspecified chronic kidney disease: Secondary | ICD-10-CM | POA: Diagnosis not present

## 2018-06-14 DIAGNOSIS — J069 Acute upper respiratory infection, unspecified: Secondary | ICD-10-CM | POA: Diagnosis not present

## 2018-06-17 DIAGNOSIS — R42 Dizziness and giddiness: Secondary | ICD-10-CM | POA: Diagnosis not present

## 2018-06-20 DIAGNOSIS — Z6831 Body mass index (BMI) 31.0-31.9, adult: Secondary | ICD-10-CM | POA: Diagnosis not present

## 2018-06-20 DIAGNOSIS — J019 Acute sinusitis, unspecified: Secondary | ICD-10-CM | POA: Diagnosis not present

## 2018-07-12 DIAGNOSIS — E049 Nontoxic goiter, unspecified: Secondary | ICD-10-CM | POA: Diagnosis not present

## 2018-07-12 DIAGNOSIS — I129 Hypertensive chronic kidney disease with stage 1 through stage 4 chronic kidney disease, or unspecified chronic kidney disease: Secondary | ICD-10-CM | POA: Diagnosis not present

## 2018-07-12 DIAGNOSIS — E039 Hypothyroidism, unspecified: Secondary | ICD-10-CM | POA: Diagnosis not present

## 2018-07-12 DIAGNOSIS — M1990 Unspecified osteoarthritis, unspecified site: Secondary | ICD-10-CM | POA: Diagnosis not present

## 2018-07-12 DIAGNOSIS — R7303 Prediabetes: Secondary | ICD-10-CM | POA: Diagnosis not present

## 2018-07-12 DIAGNOSIS — N183 Chronic kidney disease, stage 3 (moderate): Secondary | ICD-10-CM | POA: Diagnosis not present

## 2018-07-29 DIAGNOSIS — M25511 Pain in right shoulder: Secondary | ICD-10-CM | POA: Diagnosis not present

## 2018-08-19 DIAGNOSIS — Z23 Encounter for immunization: Secondary | ICD-10-CM | POA: Diagnosis not present

## 2018-10-27 DIAGNOSIS — M25511 Pain in right shoulder: Secondary | ICD-10-CM | POA: Diagnosis not present

## 2018-11-19 DIAGNOSIS — J209 Acute bronchitis, unspecified: Secondary | ICD-10-CM | POA: Diagnosis not present

## 2018-11-19 DIAGNOSIS — J019 Acute sinusitis, unspecified: Secondary | ICD-10-CM | POA: Diagnosis not present

## 2018-11-19 DIAGNOSIS — R05 Cough: Secondary | ICD-10-CM | POA: Diagnosis not present

## 2018-12-01 DIAGNOSIS — M13841 Other specified arthritis, right hand: Secondary | ICD-10-CM | POA: Diagnosis not present

## 2018-12-01 DIAGNOSIS — M13842 Other specified arthritis, left hand: Secondary | ICD-10-CM | POA: Diagnosis not present

## 2019-01-24 DIAGNOSIS — Z1231 Encounter for screening mammogram for malignant neoplasm of breast: Secondary | ICD-10-CM | POA: Diagnosis not present

## 2019-01-26 DIAGNOSIS — I129 Hypertensive chronic kidney disease with stage 1 through stage 4 chronic kidney disease, or unspecified chronic kidney disease: Secondary | ICD-10-CM | POA: Diagnosis not present

## 2019-01-26 DIAGNOSIS — G43909 Migraine, unspecified, not intractable, without status migrainosus: Secondary | ICD-10-CM | POA: Diagnosis not present

## 2019-01-26 DIAGNOSIS — N951 Menopausal and female climacteric states: Secondary | ICD-10-CM | POA: Diagnosis not present

## 2019-01-26 DIAGNOSIS — J45909 Unspecified asthma, uncomplicated: Secondary | ICD-10-CM | POA: Diagnosis not present

## 2019-01-26 DIAGNOSIS — M1990 Unspecified osteoarthritis, unspecified site: Secondary | ICD-10-CM | POA: Diagnosis not present

## 2019-01-26 DIAGNOSIS — E039 Hypothyroidism, unspecified: Secondary | ICD-10-CM | POA: Diagnosis not present

## 2019-01-26 DIAGNOSIS — F39 Unspecified mood [affective] disorder: Secondary | ICD-10-CM | POA: Diagnosis not present

## 2019-01-26 DIAGNOSIS — Z0000 Encounter for general adult medical examination without abnormal findings: Secondary | ICD-10-CM | POA: Diagnosis not present

## 2019-01-26 DIAGNOSIS — N183 Chronic kidney disease, stage 3 (moderate): Secondary | ICD-10-CM | POA: Diagnosis not present

## 2019-01-26 DIAGNOSIS — E049 Nontoxic goiter, unspecified: Secondary | ICD-10-CM | POA: Diagnosis not present

## 2019-01-26 DIAGNOSIS — R7303 Prediabetes: Secondary | ICD-10-CM | POA: Diagnosis not present

## 2019-02-09 DIAGNOSIS — L570 Actinic keratosis: Secondary | ICD-10-CM | POA: Diagnosis not present

## 2019-02-09 DIAGNOSIS — D2261 Melanocytic nevi of right upper limb, including shoulder: Secondary | ICD-10-CM | POA: Diagnosis not present

## 2019-02-09 DIAGNOSIS — L821 Other seborrheic keratosis: Secondary | ICD-10-CM | POA: Diagnosis not present

## 2019-02-09 DIAGNOSIS — L819 Disorder of pigmentation, unspecified: Secondary | ICD-10-CM | POA: Diagnosis not present

## 2019-02-09 DIAGNOSIS — Z85820 Personal history of malignant melanoma of skin: Secondary | ICD-10-CM | POA: Diagnosis not present

## 2019-02-09 DIAGNOSIS — L814 Other melanin hyperpigmentation: Secondary | ICD-10-CM | POA: Diagnosis not present

## 2019-02-09 DIAGNOSIS — D692 Other nonthrombocytopenic purpura: Secondary | ICD-10-CM | POA: Diagnosis not present

## 2019-04-05 DIAGNOSIS — M25511 Pain in right shoulder: Secondary | ICD-10-CM | POA: Diagnosis not present

## 2019-05-25 DIAGNOSIS — M7062 Trochanteric bursitis, left hip: Secondary | ICD-10-CM | POA: Diagnosis not present

## 2019-05-25 DIAGNOSIS — M25552 Pain in left hip: Secondary | ICD-10-CM | POA: Diagnosis not present

## 2019-07-26 DIAGNOSIS — I10 Essential (primary) hypertension: Secondary | ICD-10-CM | POA: Diagnosis not present

## 2019-07-29 DIAGNOSIS — I129 Hypertensive chronic kidney disease with stage 1 through stage 4 chronic kidney disease, or unspecified chronic kidney disease: Secondary | ICD-10-CM | POA: Diagnosis not present

## 2019-07-29 DIAGNOSIS — E039 Hypothyroidism, unspecified: Secondary | ICD-10-CM | POA: Diagnosis not present

## 2019-07-29 DIAGNOSIS — N183 Chronic kidney disease, stage 3 (moderate): Secondary | ICD-10-CM | POA: Diagnosis not present

## 2019-08-01 DIAGNOSIS — E039 Hypothyroidism, unspecified: Secondary | ICD-10-CM | POA: Diagnosis not present

## 2019-08-01 DIAGNOSIS — I129 Hypertensive chronic kidney disease with stage 1 through stage 4 chronic kidney disease, or unspecified chronic kidney disease: Secondary | ICD-10-CM | POA: Diagnosis not present

## 2019-08-01 DIAGNOSIS — Z23 Encounter for immunization: Secondary | ICD-10-CM | POA: Diagnosis not present

## 2019-08-03 DIAGNOSIS — M25511 Pain in right shoulder: Secondary | ICD-10-CM | POA: Diagnosis not present

## 2019-08-11 DIAGNOSIS — L97222 Non-pressure chronic ulcer of left calf with fat layer exposed: Secondary | ICD-10-CM | POA: Diagnosis not present

## 2019-08-11 DIAGNOSIS — S81802D Unspecified open wound, left lower leg, subsequent encounter: Secondary | ICD-10-CM | POA: Diagnosis not present

## 2019-08-11 DIAGNOSIS — T148XXA Other injury of unspecified body region, initial encounter: Secondary | ICD-10-CM | POA: Diagnosis not present

## 2019-08-18 DIAGNOSIS — L97222 Non-pressure chronic ulcer of left calf with fat layer exposed: Secondary | ICD-10-CM | POA: Diagnosis not present

## 2019-08-23 ENCOUNTER — Encounter (HOSPITAL_BASED_OUTPATIENT_CLINIC_OR_DEPARTMENT_OTHER): Payer: Medicare Other | Attending: Internal Medicine | Admitting: Internal Medicine

## 2019-08-25 DIAGNOSIS — I8393 Asymptomatic varicose veins of bilateral lower extremities: Secondary | ICD-10-CM | POA: Diagnosis not present

## 2019-08-25 DIAGNOSIS — L97222 Non-pressure chronic ulcer of left calf with fat layer exposed: Secondary | ICD-10-CM | POA: Diagnosis not present

## 2019-08-31 DIAGNOSIS — L97222 Non-pressure chronic ulcer of left calf with fat layer exposed: Secondary | ICD-10-CM | POA: Diagnosis not present

## 2019-09-01 DIAGNOSIS — L97222 Non-pressure chronic ulcer of left calf with fat layer exposed: Secondary | ICD-10-CM | POA: Diagnosis not present

## 2019-09-01 DIAGNOSIS — I8393 Asymptomatic varicose veins of bilateral lower extremities: Secondary | ICD-10-CM | POA: Diagnosis not present

## 2019-09-05 DIAGNOSIS — L97222 Non-pressure chronic ulcer of left calf with fat layer exposed: Secondary | ICD-10-CM | POA: Diagnosis not present

## 2019-09-05 DIAGNOSIS — S81802D Unspecified open wound, left lower leg, subsequent encounter: Secondary | ICD-10-CM | POA: Diagnosis not present

## 2019-09-05 DIAGNOSIS — I8393 Asymptomatic varicose veins of bilateral lower extremities: Secondary | ICD-10-CM | POA: Diagnosis not present

## 2019-09-08 DIAGNOSIS — I8393 Asymptomatic varicose veins of bilateral lower extremities: Secondary | ICD-10-CM | POA: Diagnosis not present

## 2019-09-08 DIAGNOSIS — L97222 Non-pressure chronic ulcer of left calf with fat layer exposed: Secondary | ICD-10-CM | POA: Diagnosis not present

## 2019-09-08 DIAGNOSIS — L97822 Non-pressure chronic ulcer of other part of left lower leg with fat layer exposed: Secondary | ICD-10-CM | POA: Diagnosis not present

## 2019-09-12 DIAGNOSIS — L97222 Non-pressure chronic ulcer of left calf with fat layer exposed: Secondary | ICD-10-CM | POA: Diagnosis not present

## 2019-09-12 DIAGNOSIS — I8393 Asymptomatic varicose veins of bilateral lower extremities: Secondary | ICD-10-CM | POA: Diagnosis not present

## 2019-09-26 DIAGNOSIS — L97222 Non-pressure chronic ulcer of left calf with fat layer exposed: Secondary | ICD-10-CM | POA: Diagnosis not present

## 2019-09-26 DIAGNOSIS — I8393 Asymptomatic varicose veins of bilateral lower extremities: Secondary | ICD-10-CM | POA: Diagnosis not present

## 2019-10-31 ENCOUNTER — Other Ambulatory Visit: Payer: Self-pay | Admitting: Family Medicine

## 2019-10-31 DIAGNOSIS — M7989 Other specified soft tissue disorders: Secondary | ICD-10-CM

## 2019-11-01 ENCOUNTER — Ambulatory Visit
Admission: RE | Admit: 2019-11-01 | Discharge: 2019-11-01 | Disposition: A | Discharge status: Home / Self Care | Payer: Medicare Other | Source: Ambulatory Visit | Attending: Family Medicine | Admitting: Family Medicine

## 2019-11-01 DIAGNOSIS — M7989 Other specified soft tissue disorders: Secondary | ICD-10-CM

## 2019-12-08 ENCOUNTER — Ambulatory Visit: Payer: Medicare Other | Attending: Internal Medicine

## 2019-12-08 DIAGNOSIS — Z23 Encounter for immunization: Secondary | ICD-10-CM | POA: Insufficient documentation

## 2019-12-08 NOTE — Progress Notes (Signed)
   Covid-19 Vaccination Clinic  Name:  Stephanie Daniel    MRN: [id number] DOB: 08/10/1951  12/08/2019  Ms. Cubero was observed post Covid-19 immunization for 15 minutes without incidence. She was provided with Vaccine Information Sheet and instruction to access the V-Safe system.   Ms. Smouse was instructed to call 911 with any severe reactions post vaccine: Marland Kitchen Difficulty breathing  . Swelling of your face and throat  . A fast heartbeat  . A bad rash all over your body  . Dizziness and weakness    Immunizations Administered    Name Date Dose VIS Date Route   Pfizer COVID-19 Vaccine 12/08/2019  9:33 AM 0.3 mL 10/28/2019 Intramuscular   Manufacturer: [place]   Lot: [id number]   [place]: [id number]

## 2019-12-29 ENCOUNTER — Ambulatory Visit: Payer: Medicare Other | Attending: Internal Medicine

## 2019-12-29 DIAGNOSIS — Z23 Encounter for immunization: Secondary | ICD-10-CM

## 2019-12-29 NOTE — Progress Notes (Signed)
   Covid-19 Vaccination Clinic  Name:  Stephanie Daniel    MRN: [id number] DOB: Dec 21, 1950  12/29/2019  [name] was observed post Covid-19 immunization for 15 minutes without incidence. She was provided with Vaccine Information Sheet and instruction to access the V-Safe system.   [name] was instructed to call 911 with any severe reactions post vaccine: Marland Kitchen Difficulty breathing  . Swelling of your face and throat  . A fast heartbeat  . A bad rash all over your body  . Dizziness and weakness    Immunizations Administered    Name Date Dose VIS Date Route   Pfizer COVID-19 Vaccine 12/29/2019  9:47 AM 0.3 mL 10/28/2019 Intramuscular   Manufacturer: [place]   Lot: [place] [id number]   [place]: [id number]

## 2020-02-03 DIAGNOSIS — R7303 Prediabetes: Secondary | ICD-10-CM | POA: Diagnosis not present

## 2020-02-03 DIAGNOSIS — Z23 Encounter for immunization: Secondary | ICD-10-CM | POA: Diagnosis not present

## 2020-02-03 DIAGNOSIS — I129 Hypertensive chronic kidney disease with stage 1 through stage 4 chronic kidney disease, or unspecified chronic kidney disease: Secondary | ICD-10-CM | POA: Diagnosis not present

## 2020-02-03 DIAGNOSIS — G43909 Migraine, unspecified, not intractable, without status migrainosus: Secondary | ICD-10-CM | POA: Diagnosis not present

## 2020-02-03 DIAGNOSIS — J45909 Unspecified asthma, uncomplicated: Secondary | ICD-10-CM | POA: Diagnosis not present

## 2020-02-03 DIAGNOSIS — E049 Nontoxic goiter, unspecified: Secondary | ICD-10-CM | POA: Diagnosis not present

## 2020-02-03 DIAGNOSIS — N183 Chronic kidney disease, stage 3 unspecified: Secondary | ICD-10-CM | POA: Diagnosis not present

## 2020-02-03 DIAGNOSIS — M1990 Unspecified osteoarthritis, unspecified site: Secondary | ICD-10-CM | POA: Diagnosis not present

## 2020-02-03 DIAGNOSIS — Z1389 Encounter for screening for other disorder: Secondary | ICD-10-CM | POA: Diagnosis not present

## 2020-02-03 DIAGNOSIS — Z0000 Encounter for general adult medical examination without abnormal findings: Secondary | ICD-10-CM | POA: Diagnosis not present

## 2020-02-03 DIAGNOSIS — Z136 Encounter for screening for cardiovascular disorders: Secondary | ICD-10-CM | POA: Diagnosis not present

## 2020-02-03 DIAGNOSIS — Z1211 Encounter for screening for malignant neoplasm of colon: Secondary | ICD-10-CM | POA: Diagnosis not present

## 2020-02-07 DIAGNOSIS — M25511 Pain in right shoulder: Secondary | ICD-10-CM | POA: Diagnosis not present

## 2020-02-07 DIAGNOSIS — M25552 Pain in left hip: Secondary | ICD-10-CM | POA: Diagnosis not present

## 2020-02-17 DIAGNOSIS — Z1231 Encounter for screening mammogram for malignant neoplasm of breast: Secondary | ICD-10-CM | POA: Diagnosis not present

## 2020-02-17 DIAGNOSIS — Z780 Asymptomatic menopausal state: Secondary | ICD-10-CM | POA: Diagnosis not present

## 2020-02-17 DIAGNOSIS — Z1211 Encounter for screening for malignant neoplasm of colon: Secondary | ICD-10-CM | POA: Diagnosis not present

## 2020-02-17 DIAGNOSIS — M85851 Other specified disorders of bone density and structure, right thigh: Secondary | ICD-10-CM | POA: Diagnosis not present

## 2020-02-17 DIAGNOSIS — M85852 Other specified disorders of bone density and structure, left thigh: Secondary | ICD-10-CM | POA: Diagnosis not present

## 2020-02-17 DIAGNOSIS — Z0000 Encounter for general adult medical examination without abnormal findings: Secondary | ICD-10-CM | POA: Diagnosis not present

## 2020-02-28 DIAGNOSIS — R1110 Vomiting, unspecified: Secondary | ICD-10-CM | POA: Diagnosis not present

## 2020-02-28 DIAGNOSIS — B349 Viral infection, unspecified: Secondary | ICD-10-CM | POA: Diagnosis not present

## 2020-02-28 DIAGNOSIS — Z1152 Encounter for screening for COVID-19: Secondary | ICD-10-CM | POA: Diagnosis not present

## 2020-02-28 DIAGNOSIS — Z20818 Contact with and (suspected) exposure to other bacterial communicable diseases: Secondary | ICD-10-CM | POA: Diagnosis not present

## 2020-02-28 DIAGNOSIS — Z03818 Encounter for observation for suspected exposure to other biological agents ruled out: Secondary | ICD-10-CM | POA: Diagnosis not present

## 2020-06-15 DIAGNOSIS — J029 Acute pharyngitis, unspecified: Secondary | ICD-10-CM | POA: Diagnosis not present

## 2020-06-22 DIAGNOSIS — R21 Rash and other nonspecific skin eruption: Secondary | ICD-10-CM | POA: Diagnosis not present

## 2020-06-22 DIAGNOSIS — L919 Hypertrophic disorder of the skin, unspecified: Secondary | ICD-10-CM | POA: Diagnosis not present

## 2020-06-28 DIAGNOSIS — E049 Nontoxic goiter, unspecified: Secondary | ICD-10-CM | POA: Diagnosis not present

## 2020-06-28 DIAGNOSIS — R0989 Other specified symptoms and signs involving the circulatory and respiratory systems: Secondary | ICD-10-CM | POA: Diagnosis not present

## 2020-06-28 DIAGNOSIS — E039 Hypothyroidism, unspecified: Secondary | ICD-10-CM | POA: Diagnosis not present

## 2020-06-28 DIAGNOSIS — K219 Gastro-esophageal reflux disease without esophagitis: Secondary | ICD-10-CM | POA: Diagnosis not present

## 2020-06-28 DIAGNOSIS — R000 Tachycardia, unspecified: Secondary | ICD-10-CM | POA: Diagnosis not present

## 2020-07-04 DIAGNOSIS — E041 Nontoxic single thyroid nodule: Secondary | ICD-10-CM | POA: Diagnosis not present

## 2020-07-04 DIAGNOSIS — E049 Nontoxic goiter, unspecified: Secondary | ICD-10-CM | POA: Diagnosis not present

## 2020-07-04 DIAGNOSIS — R0989 Other specified symptoms and signs involving the circulatory and respiratory systems: Secondary | ICD-10-CM | POA: Diagnosis not present

## 2020-08-24 DIAGNOSIS — Z23 Encounter for immunization: Secondary | ICD-10-CM | POA: Diagnosis not present

## 2020-09-04 DIAGNOSIS — M25551 Pain in right hip: Secondary | ICD-10-CM | POA: Diagnosis not present

## 2020-09-04 DIAGNOSIS — M25552 Pain in left hip: Secondary | ICD-10-CM | POA: Diagnosis not present

## 2020-09-05 DIAGNOSIS — D692 Other nonthrombocytopenic purpura: Secondary | ICD-10-CM | POA: Diagnosis not present

## 2020-09-05 DIAGNOSIS — L814 Other melanin hyperpigmentation: Secondary | ICD-10-CM | POA: Diagnosis not present

## 2020-09-05 DIAGNOSIS — L821 Other seborrheic keratosis: Secondary | ICD-10-CM | POA: Diagnosis not present

## 2020-09-05 DIAGNOSIS — Z79899 Other long term (current) drug therapy: Secondary | ICD-10-CM | POA: Diagnosis not present

## 2020-09-05 DIAGNOSIS — R21 Rash and other nonspecific skin eruption: Secondary | ICD-10-CM | POA: Diagnosis not present

## 2020-09-05 DIAGNOSIS — D485 Neoplasm of uncertain behavior of skin: Secondary | ICD-10-CM | POA: Diagnosis not present

## 2020-09-05 DIAGNOSIS — L570 Actinic keratosis: Secondary | ICD-10-CM | POA: Diagnosis not present

## 2020-09-05 DIAGNOSIS — Z85820 Personal history of malignant melanoma of skin: Secondary | ICD-10-CM | POA: Diagnosis not present

## 2020-09-05 DIAGNOSIS — L820 Inflamed seborrheic keratosis: Secondary | ICD-10-CM | POA: Diagnosis not present

## 2020-09-17 DIAGNOSIS — Z23 Encounter for immunization: Secondary | ICD-10-CM | POA: Diagnosis not present

## 2020-09-20 DIAGNOSIS — M25561 Pain in right knee: Secondary | ICD-10-CM | POA: Diagnosis not present

## 2020-10-18 DIAGNOSIS — M48061 Spinal stenosis, lumbar region without neurogenic claudication: Secondary | ICD-10-CM | POA: Diagnosis not present

## 2020-10-18 DIAGNOSIS — M533 Sacrococcygeal disorders, not elsewhere classified: Secondary | ICD-10-CM | POA: Diagnosis not present

## 2020-10-18 DIAGNOSIS — M25551 Pain in right hip: Secondary | ICD-10-CM | POA: Diagnosis not present

## 2020-10-23 DIAGNOSIS — M5416 Radiculopathy, lumbar region: Secondary | ICD-10-CM | POA: Diagnosis not present

## 2020-11-07 DIAGNOSIS — M533 Sacrococcygeal disorders, not elsewhere classified: Secondary | ICD-10-CM | POA: Diagnosis not present

## 2020-12-26 DIAGNOSIS — M5459 Other low back pain: Secondary | ICD-10-CM | POA: Diagnosis not present

## 2020-12-26 DIAGNOSIS — M25561 Pain in right knee: Secondary | ICD-10-CM | POA: Diagnosis not present

## 2021-01-07 DIAGNOSIS — M5459 Other low back pain: Secondary | ICD-10-CM | POA: Diagnosis not present

## 2021-01-15 DIAGNOSIS — M25561 Pain in right knee: Secondary | ICD-10-CM | POA: Diagnosis not present

## 2021-01-24 DIAGNOSIS — M5416 Radiculopathy, lumbar region: Secondary | ICD-10-CM | POA: Diagnosis not present

## 2021-01-30 DIAGNOSIS — S83271D Complex tear of lateral meniscus, current injury, right knee, subsequent encounter: Secondary | ICD-10-CM | POA: Diagnosis not present

## 2021-02-06 DIAGNOSIS — M5136 Other intervertebral disc degeneration, lumbar region: Secondary | ICD-10-CM | POA: Diagnosis not present

## 2021-02-06 DIAGNOSIS — G43909 Migraine, unspecified, not intractable, without status migrainosus: Secondary | ICD-10-CM | POA: Diagnosis not present

## 2021-02-06 DIAGNOSIS — R7303 Prediabetes: Secondary | ICD-10-CM | POA: Diagnosis not present

## 2021-02-06 DIAGNOSIS — M5459 Other low back pain: Secondary | ICD-10-CM | POA: Diagnosis not present

## 2021-02-06 DIAGNOSIS — F39 Unspecified mood [affective] disorder: Secondary | ICD-10-CM | POA: Diagnosis not present

## 2021-02-06 DIAGNOSIS — E039 Hypothyroidism, unspecified: Secondary | ICD-10-CM | POA: Diagnosis not present

## 2021-02-06 DIAGNOSIS — N959 Unspecified menopausal and perimenopausal disorder: Secondary | ICD-10-CM | POA: Diagnosis not present

## 2021-02-06 DIAGNOSIS — K219 Gastro-esophageal reflux disease without esophagitis: Secondary | ICD-10-CM | POA: Diagnosis not present

## 2021-02-06 DIAGNOSIS — I129 Hypertensive chronic kidney disease with stage 1 through stage 4 chronic kidney disease, or unspecified chronic kidney disease: Secondary | ICD-10-CM | POA: Diagnosis not present

## 2021-02-06 DIAGNOSIS — Z1389 Encounter for screening for other disorder: Secondary | ICD-10-CM | POA: Diagnosis not present

## 2021-02-06 DIAGNOSIS — E049 Nontoxic goiter, unspecified: Secondary | ICD-10-CM | POA: Diagnosis not present

## 2021-02-06 DIAGNOSIS — I700 Atherosclerosis of aorta: Secondary | ICD-10-CM | POA: Diagnosis not present

## 2021-02-06 DIAGNOSIS — Z0000 Encounter for general adult medical examination without abnormal findings: Secondary | ICD-10-CM | POA: Diagnosis not present

## 2021-02-06 DIAGNOSIS — Z1211 Encounter for screening for malignant neoplasm of colon: Secondary | ICD-10-CM | POA: Diagnosis not present

## 2021-02-06 DIAGNOSIS — N1832 Chronic kidney disease, stage 3b: Secondary | ICD-10-CM | POA: Diagnosis not present

## 2021-02-22 DIAGNOSIS — Z1231 Encounter for screening mammogram for malignant neoplasm of breast: Secondary | ICD-10-CM | POA: Diagnosis not present

## 2021-02-26 DIAGNOSIS — M545 Low back pain, unspecified: Secondary | ICD-10-CM | POA: Diagnosis not present

## 2021-02-26 DIAGNOSIS — M5459 Other low back pain: Secondary | ICD-10-CM | POA: Diagnosis not present

## 2021-03-12 DIAGNOSIS — M47816 Spondylosis without myelopathy or radiculopathy, lumbar region: Secondary | ICD-10-CM | POA: Diagnosis not present

## 2021-03-20 DIAGNOSIS — Z1211 Encounter for screening for malignant neoplasm of colon: Secondary | ICD-10-CM | POA: Diagnosis not present

## 2021-03-27 ENCOUNTER — Other Ambulatory Visit (HOSPITAL_BASED_OUTPATIENT_CLINIC_OR_DEPARTMENT_OTHER): Payer: Self-pay

## 2021-03-27 ENCOUNTER — Ambulatory Visit: Payer: Medicare Other | Attending: Internal Medicine

## 2021-03-27 ENCOUNTER — Other Ambulatory Visit: Payer: Self-pay

## 2021-03-27 DIAGNOSIS — Z23 Encounter for immunization: Secondary | ICD-10-CM

## 2021-03-27 MED ORDER — PFIZER-BIONT COVID-19 VAC-TRIS 30 MCG/0.3ML IM SUSP
INTRAMUSCULAR | 0 refills | Status: DC
  Filled 2021-03-27: qty 0.3, 1d supply, fill #0

## 2021-03-27 NOTE — Progress Notes (Signed)
   Covid-19 Vaccination Clinic  Name:  Stephanie Daniel    MRN: [id number] DOB: [birth year]  03/27/2021  Stephanie Daniel was observed post Covid-19 immunization for 15 minutes without incident. She was provided with Vaccine Information Sheet and instruction to access the V-Safe system.   Stephanie Daniel was instructed to call 911 with any severe reactions post vaccine: Marland Kitchen Difficulty breathing  . Swelling of face and throat  . A fast heartbeat  . A bad rash all over body  . Dizziness and weakness   Immunizations Administered    Name Date Dose VIS Date Route   PFIZER Comrnaty([place] TOP) Covid-19 Vaccine 03/27/2021 12:54 PM 0.3 mL 10/25/2020 Intramuscular   Manufacturer: [place]   Lot: [id number]   [place]: [id number]

## 2021-03-30 DIAGNOSIS — M47816 Spondylosis without myelopathy or radiculopathy, lumbar region: Secondary | ICD-10-CM | POA: Diagnosis not present

## 2021-04-17 DIAGNOSIS — S83271D Complex tear of lateral meniscus, current injury, right knee, subsequent encounter: Secondary | ICD-10-CM | POA: Diagnosis not present

## 2021-04-17 DIAGNOSIS — M7541 Impingement syndrome of right shoulder: Secondary | ICD-10-CM | POA: Diagnosis not present

## 2021-04-18 DIAGNOSIS — M47816 Spondylosis without myelopathy or radiculopathy, lumbar region: Secondary | ICD-10-CM | POA: Diagnosis not present

## 2021-04-30 DIAGNOSIS — M47816 Spondylosis without myelopathy or radiculopathy, lumbar region: Secondary | ICD-10-CM | POA: Diagnosis not present

## 2021-05-27 DIAGNOSIS — M5459 Other low back pain: Secondary | ICD-10-CM | POA: Diagnosis not present

## 2021-06-27 DIAGNOSIS — M47816 Spondylosis without myelopathy or radiculopathy, lumbar region: Secondary | ICD-10-CM | POA: Diagnosis not present

## 2021-06-27 DIAGNOSIS — M545 Low back pain, unspecified: Secondary | ICD-10-CM | POA: Diagnosis not present

## 2021-07-28 ENCOUNTER — Other Ambulatory Visit: Payer: Self-pay

## 2021-07-28 ENCOUNTER — Observation Stay (HOSPITAL_COMMUNITY)
Admission: EM | Admit: 2021-07-28 | Discharge: 2021-07-29 | Disposition: A | Discharge status: Home / Self Care | Payer: Medicare Other | Attending: Internal Medicine | Admitting: Internal Medicine

## 2021-07-28 ENCOUNTER — Encounter (HOSPITAL_COMMUNITY): Payer: Self-pay

## 2021-07-28 ENCOUNTER — Emergency Department (HOSPITAL_COMMUNITY): Payer: Medicare Other

## 2021-07-28 DIAGNOSIS — Z8249 Family history of ischemic heart disease and other diseases of the circulatory system: Secondary | ICD-10-CM | POA: Insufficient documentation

## 2021-07-28 DIAGNOSIS — Z888 Allergy status to other drugs, medicaments and biological substances status: Secondary | ICD-10-CM | POA: Diagnosis not present

## 2021-07-28 DIAGNOSIS — I499 Cardiac arrhythmia, unspecified: Secondary | ICD-10-CM | POA: Diagnosis not present

## 2021-07-28 DIAGNOSIS — I2510 Atherosclerotic heart disease of native coronary artery without angina pectoris: Secondary | ICD-10-CM | POA: Diagnosis not present

## 2021-07-28 DIAGNOSIS — Z87891 Personal history of nicotine dependence: Secondary | ICD-10-CM | POA: Diagnosis not present

## 2021-07-28 DIAGNOSIS — E039 Hypothyroidism, unspecified: Secondary | ICD-10-CM | POA: Diagnosis present

## 2021-07-28 DIAGNOSIS — I700 Atherosclerosis of aorta: Secondary | ICD-10-CM | POA: Diagnosis not present

## 2021-07-28 DIAGNOSIS — Z79890 Hormone replacement therapy (postmenopausal): Secondary | ICD-10-CM | POA: Insufficient documentation

## 2021-07-28 DIAGNOSIS — R231 Pallor: Secondary | ICD-10-CM | POA: Diagnosis not present

## 2021-07-28 DIAGNOSIS — Z96652 Presence of left artificial knee joint: Secondary | ICD-10-CM | POA: Insufficient documentation

## 2021-07-28 DIAGNOSIS — R0789 Other chest pain: Secondary | ICD-10-CM | POA: Diagnosis not present

## 2021-07-28 DIAGNOSIS — I10 Essential (primary) hypertension: Secondary | ICD-10-CM | POA: Diagnosis present

## 2021-07-28 DIAGNOSIS — R079 Chest pain, unspecified: Secondary | ICD-10-CM

## 2021-07-28 DIAGNOSIS — Z20822 Contact with and (suspected) exposure to covid-19: Secondary | ICD-10-CM | POA: Diagnosis not present

## 2021-07-28 DIAGNOSIS — Z79899 Other long term (current) drug therapy: Secondary | ICD-10-CM | POA: Insufficient documentation

## 2021-07-28 DIAGNOSIS — Z7901 Long term (current) use of anticoagulants: Secondary | ICD-10-CM | POA: Insufficient documentation

## 2021-07-28 LAB — CBC WITH DIFFERENTIAL/PLATELET
Abs Immature Granulocytes: 0.03 10*3/uL (ref 0.00–0.07)
Basophils Absolute: 0.1 10*3/uL (ref 0.0–0.1)
Basophils Relative: 1 %
Eosinophils Absolute: 0.4 10*3/uL (ref 0.0–0.5)
Eosinophils Relative: 6 %
HCT: 45.6 % (ref 36.0–46.0)
Hemoglobin: 15.1 g/dL — ABNORMAL HIGH (ref 12.0–15.0)
Immature Granulocytes: 0 %
Lymphocytes Relative: 22 %
Lymphs Abs: 1.5 10*3/uL (ref 0.7–4.0)
MCH: 34.2 pg — ABNORMAL HIGH (ref 26.0–34.0)
MCHC: 33.1 g/dL (ref 30.0–36.0)
MCV: 103.2 fL — ABNORMAL HIGH (ref 80.0–100.0)
Monocytes Absolute: 0.7 10*3/uL (ref 0.1–1.0)
Monocytes Relative: 10 %
Neutro Abs: 4.2 10*3/uL (ref 1.7–7.7)
Neutrophils Relative %: 61 %
Platelets: 254 10*3/uL (ref 150–400)
RBC: 4.42 MIL/uL (ref 3.87–5.11)
RDW: 15.2 % (ref 11.5–15.5)
WBC: 7 10*3/uL (ref 4.0–10.5)
nRBC: 0 % (ref 0.0–0.2)

## 2021-07-28 LAB — TROPONIN I (HIGH SENSITIVITY)
Troponin I (High Sensitivity): 4 ng/L (ref <18)
Troponin I (High Sensitivity): 5 ng/L (ref <18)

## 2021-07-28 LAB — RESP PANEL BY RT-PCR (FLU A&B, COVID) ARPGX2
Influenza A by PCR: NEGATIVE
Influenza A by PCR: NEGATIVE
Influenza B by PCR: NEGATIVE
Influenza B by PCR: NEGATIVE
SARS Coronavirus 2 by RT PCR: NEGATIVE
SARS Coronavirus 2 by RT PCR: NEGATIVE

## 2021-07-28 LAB — COMPREHENSIVE METABOLIC PANEL
ALT: 20 U/L (ref 0–44)
AST: 22 U/L (ref 15–41)
Albumin: 3.6 g/dL (ref 3.5–5.0)
Alkaline Phosphatase: 50 U/L (ref 38–126)
Anion gap: 11 (ref 5–15)
BUN: 12 mg/dL (ref 8–23)
CO2: 22 mmol/L (ref 22–32)
Calcium: 9.2 mg/dL (ref 8.9–10.3)
Chloride: 104 mmol/L (ref 98–111)
Creatinine, Ser: 1.1 mg/dL — ABNORMAL HIGH (ref 0.44–1.00)
GFR, Estimated: 53 mL/min — ABNORMAL LOW (ref >60)
Glucose, Bld: 92 mg/dL (ref 70–99)
Potassium: 4 mmol/L (ref 3.5–5.1)
Sodium: 137 mmol/L (ref 135–145)
Total Bilirubin: 0.7 mg/dL (ref 0.3–1.2)
Total Protein: 6.2 g/dL — ABNORMAL LOW (ref 6.5–8.1)

## 2021-07-28 LAB — MAGNESIUM: Magnesium: 1.9 mg/dL (ref 1.7–2.4)

## 2021-07-28 MED ORDER — ONDANSETRON HCL 4 MG/2ML IJ SOLN: 4.0000 mg | Freq: Four times a day (QID) | INTRAMUSCULAR | Status: DC | PRN

## 2021-07-28 MED ORDER — ACETAMINOPHEN 325 MG PO TABS: 650.0000 mg | ORAL_TABLET | ORAL | Status: DC | PRN

## 2021-07-28 MED ORDER — PANTOPRAZOLE SODIUM 40 MG PO TBEC
40.0000 mg | DELAYED_RELEASE_TABLET | Freq: Every day | ORAL | Status: DC
  Administered 2021-07-29: 40 mg via ORAL
  Filled 2021-07-28: qty 1

## 2021-07-28 MED ORDER — HYDROCHLOROTHIAZIDE 12.5 MG PO CAPS
25.0000 mg | ORAL_CAPSULE | Freq: Every day | ORAL | Status: DC
  Administered 2021-07-29: 25 mg via ORAL
  Filled 2021-07-28: qty 2

## 2021-07-28 MED ORDER — ATORVASTATIN CALCIUM 40 MG PO TABS
80.0000 mg | ORAL_TABLET | Freq: Every day | ORAL | Status: DC
  Administered 2021-07-28: 80 mg via ORAL
  Filled 2021-07-28: qty 2

## 2021-07-28 MED ORDER — GABAPENTIN 300 MG PO CAPS
300.0000 mg | ORAL_CAPSULE | Freq: Every day | ORAL | Status: DC
  Administered 2021-07-28: 300 mg via ORAL
  Filled 2021-07-28: qty 1

## 2021-07-28 MED ORDER — LEVOTHYROXINE SODIUM 25 MCG PO TABS
50.0000 ug | ORAL_TABLET | Freq: Every day | ORAL | Status: DC
  Administered 2021-07-29: 50 ug via ORAL
  Filled 2021-07-28: qty 2

## 2021-07-28 MED ORDER — LISINOPRIL 20 MG PO TABS
20.0000 mg | ORAL_TABLET | Freq: Every day | ORAL | Status: DC
  Administered 2021-07-29: 20 mg via ORAL
  Filled 2021-07-28: qty 1

## 2021-07-28 MED ORDER — NITROGLYCERIN 0.4 MG SL SUBL: 0.4000 mg | SUBLINGUAL_TABLET | SUBLINGUAL | Status: DC | PRN

## 2021-07-28 NOTE — ED Notes (Signed)
Pt's granddaughter at bedside but will be leaving shortly. She would like to be contacted (phone number in chart) if there is a change in care.

## 2021-07-28 NOTE — ED Triage Notes (Signed)
Pt arrived to ED from home via [facility] with c/o CP. EMS gave pt 3 nitro and '324mg'$  asa. Initial BP 222/112 and BP improved to 166/92 after nitro. CP subsided after nitro. Hx of HTN. Pt A&Ox4.

## 2021-07-28 NOTE — H&P (Addendum)
Cardiology Admission History and Physical:   Patient ID: Stephanie Daniel MRN: [id number]; DOB: Apr 09, 1951   Admission date: 07/28/2021  Primary Care Provider: Kelton Pillar, MD [facility] Cardiologist: None  [facility] Electrophysiologist:  None   Chief Complaint: CP  Patient Profile:   Stephanie Daniel is a 70 y.o. female with HTN and hypothyroidism who presents with acute onset chest pressure.  History of Present Illness:   Stephanie Daniel experienced acute onset chest pressure with rest for which she called EMS.  On EMS arrival she was given 3 sublingual nitroglycerin and aspirin loaded with 324 mg p.o.  Her initial blood pressure was 222/112 which improved to 166/92 after nitroglycerin.  Her chest pressure subsided following 3 SNLG and improved with each subsequent nitro before completely going away within 15 minutes.  She has HTN and hypothyroidism but otherwise no known coronary disease.  Today she reports that she was sitting in her chair reading a book when she had gradually worsening bilateral left shoulder pain which fairly quickly progressed to anterior central chest pressure of 6/10 severity.  This lasted for around 1.5 hours before going away with EMS arrival and sublingual nitroglycerin.  Symptom onset was at 1548 on 09/11.  She had a similar episode around 4 weeks ago which lasted for around 2 hours and for which she took 2 baby aspirin.  She is not sure whether the aspirin had any effect on the chest pressure but it went away within 30 minutes or so afterwards.  During that episode she was sitting on her couch watching TV when she had fairly acute onset 8/10 chest pressure without radiation similar to the episode today but more severe.  She did not seek evaluation during that episode was the first when she had ever had.  She has not had any recent trauma or muscle injury.  There is a questionable family history of CAD in her father however she said that this was just based off  the death certificate which listed a heart attack.  He had end-stage cirrhosis and his doctors knew that he was likely going to pass within a few weeks of an office visit.  He was at home and still fairly functional when he fell over and suddenly passed.  He did not have evaluation at hospital or an autopsy to comment on an MRI as the definitive cause of death.  Stephanie Daniel has 1 son who unfortunately passed away at age 38 from a rafting accident.  She has no other kids but does have a granddaughter of her son who lives around 30 minutes away.  She is divorced and currently lives alone.  She is retired.  She is still very functional and has no limitations to activities at baseline.  She denies any orthopnea, SOB, DOE, lower extremity edema, weight gain, or diaphoresis associated with these episodes or in the recent past.  She follows with [facility] with Dr. Kelton Pillar.  She does not regularly check her blood pressure but says that in the office it is usually little bit high in the 150s although is never been in the 200s.  She is on lisinopril and HCTZ low dose and takes them in the morning and took both doses prior to her episode today.  She has not missed any of her medications and has a list on her phone.  She has distant prior tobacco use but is no longer using.  She is not followed by cardiology.  She has  never had prior cardiac evaluation and denies any previous echo or cath.  Heart score of 5 so ED consulted for expedited stress testing given concerning Daniel despite initial negative troponin.  VS during my evaluation P 84, BP 188/122 (141), SpO2 98% RA, RR 21. She was asx despite HTN.   Past Medical History:  Diagnosis Date   Arthritis    Hypertension    Hypothyroidism    Thyroid disease    Past Surgical History:  Procedure Laterality Date   ABDOMINAL HYSTERECTOMY     APPENDECTOMY     KNEE SURGERY  2013   TOTAL KNEE ARTHROPLASTY Left 05/04/2017   Procedure: LEFT TOTAL KNEE  ARTHROPLASTY;  Surgeon: Paralee Cancel, MD;  Location: [facility];  Service: Orthopedics;  Laterality: Left;  90 mins    Medications Prior to Admission: Prior to Admission medications   Medication Sig Start Date End Date Taking? Authorizing Provider  acyclovir (ZOVIRAX) 400 MG tablet Take 400 mg by mouth 2 (two) times daily. 02/23/17   [provider]  butalbital-acetaminophen-caffeine (FIORICET WITH CODEINE) 50-325-40-30 MG capsule Take 1 capsule by mouth every 6 (six) hours as needed. For migraine headaches 02/11/17   [provider]  Calcium Carb-Cholecalciferol (CALCIUM 600+D) 600-800 MG-UNIT TABS Take 1 tablet by mouth daily.    [provider]  celecoxib (CELEBREX) 200 MG capsule Take 1 capsule (200 mg total) by mouth every 12 (twelve) hours. 05/06/17   Danae Orleans, PA-C  COVID-19 mRNA Vac-TriS, Pfizer, (PFIZER-BIONT COVID-19 VAC-TRIS) SUSP injection Inject into the muscle. 03/27/21   Carlyle Basques, MD  docusate sodium (COLACE) 100 MG capsule Take 1 capsule (100 mg total) by mouth 2 (two) times daily. 05/04/17   Danae Orleans, PA-C  ferrous sulfate (FERROUSUL) 325 (65 FE) MG tablet Take 1 tablet (325 mg total) by mouth 3 (three) times daily with meals. 05/04/17   Danae Orleans, PA-C  fish oil-omega-3 fatty acids 1000 MG capsule Take 2,000 mg by mouth daily.     [provider]  gabapentin (NEURONTIN) 300 MG capsule Take 300 mg by mouth at bedtime.    [provider]  GLUCOSAMINE-CHONDROITIN-MSM-D3 PO Take 1 tablet by mouth daily.    [provider]  hydrochlorothiazide (MICROZIDE) 12.5 MG capsule Take 12.5 mg by mouth daily. 02/27/17   [provider]  HYDROcodone-acetaminophen (NORCO) 7.5-325 MG tablet Take 1-2 tablets by mouth every 4 (four) hours as needed for moderate pain or severe pain. 05/06/17   Danae Orleans, PA-C  levothyroxine (SYNTHROID, LEVOTHROID) 50 MCG tablet Take 50 mcg by mouth daily before breakfast. 02/17/17    [provider]  lisinopril (PRINIVIL,ZESTRIL) 10 MG tablet Take 10 mg by mouth daily.    [provider]  methocarbamol (ROBAXIN) 500 MG tablet Take 1 tablet (500 mg total) by mouth every 6 (six) hours as needed for muscle spasms. 05/06/17   Danae Orleans, PA-C  omeprazole (PRILOSEC) 40 MG capsule Take 40 mg by mouth daily.    [provider]  ondansetron (ZOFRAN) 4 MG tablet Take 1 tablet (4 mg total) by mouth every 6 (six) hours as needed for nausea. 05/06/17   Danae Orleans, PA-C  polyethylene glycol (MIRALAX / GLYCOLAX) packet Take 17 g by mouth 2 (two) times daily. 05/04/17   Danae Orleans, PA-C  rivaroxaban (XARELTO) 10 MG TABS tablet Take 1 tablet (10 mg total) by mouth daily. 05/07/17 05/19/17  Danae Orleans, PA-C  SUMAtriptan (IMITREX) 100 MG tablet Take 100 mg by mouth 2 (two) times daily  as needed. For migraine headaches. 02/11/17   [provider]     Allergies:    Allergies  Allergen Reactions   Albuterol     Social History:   Social History   Socioeconomic History   Marital status: Divorced    Spouse name: Not on file   Number of children: Not on file   Years of education: Not on file   Highest education level: Not on file  Occupational History   Not on file  Tobacco Use   Smoking status: Former    Types: Cigarettes    Quit date: 04/28/1989    Years since quitting: 32.2   Smokeless tobacco: Never  Vaping Use   Vaping Use: Never used  Substance and Sexual Activity   Alcohol use: No    Comment: Occas   Drug use: No   Sexual activity: Not on file  Other Topics Concern   Not on file  Social History Narrative   Not on file   Social Determinants of Health   Financial Resource Strain: Not on file  Food Insecurity: Not on file  Transportation Needs: Not on file  Physical Activity: Not on file  Stress: Not on file  Social Connections: Not on file  Intimate Partner Violence: Not on file    Family History:  The patient's  family history includes COPD in her brother and mother; Cancer in her father; Heart disease in her father and mother; Hypertension in her son.    ROS:   Review of Systems: [y] = yes, '[ ]'$  = no      General: Weight gain '[ ]'$ ; Weight loss '[ ]'$ ; Anorexia '[ ]'$ ; Fatigue '[ ]'$ ; Fever '[ ]'$ ; Chills '[ ]'$ ; Weakness '[ ]'$    Cardiac: Chest pain/pressure [y]; Resting SOB '[ ]'$ ; Exertional SOB '[ ]'$ ; Orthopnea '[ ]'$ ; Pedal Edema '[ ]'$ ; Palpitations '[ ]'$ ; Syncope '[ ]'$ ; Presyncope '[ ]'$ ; Paroxysmal nocturnal dyspnea '[ ]'$    Pulmonary: Cough '[ ]'$ ; Wheezing '[ ]'$ ; Hemoptysis '[ ]'$ ; Sputum '[ ]'$ ; Snoring '[ ]'$    GI: Vomiting '[ ]'$ ; Dysphagia '[ ]'$ ; Melena '[ ]'$ ; Hematochezia '[ ]'$ ; Heartburn '[ ]'$ ; Abdominal pain '[ ]'$ ; Constipation '[ ]'$ ; Diarrhea '[ ]'$ ; BRBPR '[ ]'$    GU: Hematuria '[ ]'$ ; Dysuria '[ ]'$ ; Nocturia '[ ]'$  Vascular: Pain in legs with walking '[ ]'$ ; Pain in feet with lying flat '[ ]'$ ; Non-healing sores '[ ]'$ ; Stroke '[ ]'$ ; TIA '[ ]'$ ; Slurred speech '[ ]'$ ;   Neuro: Headaches '[ ]'$ ; Vertigo '[ ]'$ ; Seizures '[ ]'$ ; Paresthesias '[ ]'$ ;Blurred vision '[ ]'$ ; Diplopia '[ ]'$ ; Vision changes '[ ]'$    Ortho/Skin: Arthritis '[ ]'$ ; Joint pain '[ ]'$ ; Muscle pain '[ ]'$ ; Joint swelling '[ ]'$ ; Back Pain '[ ]'$ ; Rash '[ ]'$    Psych: Depression '[ ]'$ ; Anxiety '[ ]'$    Heme: Bleeding problems '[ ]'$ ; Clotting disorders '[ ]'$ ; Anemia '[ ]'$    Endocrine: Diabetes '[ ]'$ ; Thyroid dysfunction '[ ]'$    Physical Exam/Data:   Vitals:   07/28/21 1900 07/28/21 1915 07/28/21 1930 07/28/21 1945  BP: (!) 152/79 (!) 163/106 (!) 177/106 (!) 188/122  Pulse: 79 82 80 83  Resp: '20 19 13 '$ (!) 23  Temp:      TempSrc:      SpO2: 96% 95% 96% 96%  Weight:      Height:       No intake or output data in the 24 hours ending 07/28/21 2029 Last 3 Weights 07/28/2021 05/04/2017 05/04/2017  Weight (lbs)  175 lb 176 lb 176 lb  Weight (kg) 79.379 kg 79.833 kg 79.833 kg     Body mass index is 30.04 kg/m.  General:  Well nourished, well developed, in no acute distress HEENT: normal Lymph: no adenopathy Neck: no JVD Endocrine:  No thryomegaly Vascular:  No carotid bruits; FA pulses 2+ bilaterally without bruits  Cardiac:  normal S1, S2; RRR; no murmur  Lungs:  clear to auscultation bilaterally, no wheezing, rhonchi or rales  Abd: soft, nontender, no hepatomegaly  Ext: no edema Musculoskeletal:  No deformities, BUE and BLE strength normal and equal Skin: warm and dry  Neuro:  CNs 2-12 intact, no focal abnormalities noted Psych:  Normal affect   EKG:  The ECG (07/28/21, 17:09), NSR, subtle upsloping ST in aVR but otherwise no signs of ischemic   Relevant CV Studies: None prior   Laboratory Data:  High Sensitivity Troponin:   Recent Labs  Lab 07/28/21 1715 07/28/21 1914  TROPONINIHS 4 5      Chemistry Recent Labs  Lab 07/28/21 1715  NA 137  K 4.0  CL 104  CO2 22  GLUCOSE 92  BUN 12  CREATININE 1.10*  CALCIUM 9.2  GFRNONAA 53*  ANIONGAP 11    Recent Labs  Lab 07/28/21 1715  PROT 6.2*  ALBUMIN 3.6  AST 22  ALT 20  ALKPHOS 50  BILITOT 0.7   Hematology Recent Labs  Lab 07/28/21 1715  WBC 7.0  RBC 4.42  HGB 15.1*  HCT 45.6  MCV 103.2*  MCH 34.2*  MCHC 33.1  RDW 15.2  PLT 254   BNPNo results for input(s): BNP, PROBNP in the last 168 hours.  DDimer No results for input(s): DDIMER in the last 168 hours.  Radiology/Studies:  DG Chest Portable 1 View  Result Date: 07/28/2021 CLINICAL DATA:  Chest pain. EXAM: PORTABLE CHEST 1 VIEW COMPARISON:  Mar 27, 2006 FINDINGS: Tortuosity and calcific atherosclerotic disease of the aorta. Cardiomediastinal silhouette is normal. Mediastinal contours appear intact. There is no evidence of focal airspace consolidation, pleural effusion or pneumothorax. Osseous structures are without acute abnormality. Soft tissues are grossly normal. IMPRESSION: 1. No active disease. 2. Tortuosity and calcific atherosclerotic disease of the aorta. Electronically Signed   By: Fidela Salisbury M.D.   On: 07/28/2021 17:59    HEART Score (for undifferentiated chest pain): 5  Assessment and  Plan:   Possible cardiac chest pain  Stephanie Daniel is somewhat concerning for rest angina although today was in the setting of hypertensive urgency with a systolic blood pressure of 220.  Although she had improvement in her symptoms with additional SL NG this may have been either secondary to management of her HTN versus supply demand mismatch secondary to obstructive CAD.  Her ECG was reassuring with no ischemic changes and her initial troponin was negative.  She has uncontrolled HTN despite low-dose antihypertensives and prior tobacco use along with obesity as her main risk factors.  There is questionable family history of premature CAD.  If her troponins rule out and I think we can proceed with either nuclear stress test or coronary CT.  She would not be a great candidate for an exercise stress test given chronic back pain that limits her exertional ability. - s/p ASA 324 mg PO daily - stress perfusion vs cCT tomorrow - check TSH, LDL, A1c - no heparin, continue daily ASA 81 mg PO for now - start atorva 80 mg PO qhs, if stress test nl and lipids nl  can d/c   HTN  - increase lisinopril and HCTZ, currently only lisinopril 10 mg PO daily and HCTZ 12.5 mg PO daily, last dose 09/11 AM, double doses of both tomorrow, plenty of BP room   Hypothyroidism  - check TSH, continue synthroid   GERD - continue omprazole 40 mg PO daily   Code status: DNAR  She explained that she understands the DNR CODE STATUS and would not want resuscitation in the event that she had any acute decompensation.  She understands that this is not necessarily long-term life support but even in an acute emergent setting she would not want to have life prolonging measures and at her age she would just want to pass.  DNR placed  Severity of Illness: The appropriate patient status for this patient is INPATIENT. Inpatient status is judged to be reasonable and necessary in order to provide the required intensity of service to  ensure the patient's safety. The patient's presenting symptoms, physical exam findings, and initial radiographic and laboratory data in the context of their chronic comorbidities is felt to place them at high risk for further clinical deterioration. Furthermore, it is not anticipated that the patient will be medically stable for discharge from the hospital within 2 midnights of admission. The following factors support the patient status of inpatient.   " The patient's presenting symptoms include chest pressure  " The worrisome physical exam findings include n/a " The initial radiographic and laboratory data are worrisome because of n/a " The chronic co-morbidities include HTN, tobacco use, hypothyroidism   * I certify that at the point of admission it is my clinical judgment that the patient will require inpatient hospital care spanning beyond 2 midnights from the point of admission due to high intensity of service, high risk for further deterioration and high frequency of surveillance required.*   For questions or updates, please contact [place] Please consult [email or web address] for contact info under   Signed, Dion Body, MD  07/28/2021 8:29 PM

## 2021-07-28 NOTE — ED Provider Notes (Signed)
[facility] EMERGENCY DEPARTMENT Provider Note   CSN: [id number] Arrival date & time: 07/28/21  1708     History Chief Complaint  Patient presents with   Chest Pain    Stephanie Daniel is a 70 y.o. female.  HPI  Patient is a 70 year old female with a history of hypertension, hypothyroidism, hyperlipidemia, who presents to the emergency department due to chest pain.  Patient states about 2 weeks ago she experienced an episode of left-sided chest pressure.  She took 2 baby aspirin and her symptoms resolved spontaneously.  About 2 to 3 hours ago she states that she was sitting down and began suddenly experiencing left-sided chest heaviness that she states started in the left shoulder blade and began radiating across to her left chest.  States her symptoms lasted about 1.5 hours and were progressively worsening so she called EMS.  She was given 3 doses of nitroglycerin as well as 324 mg of ASA.  She was initially hypertensive at 222/112 and this improved to 166/92.  EMS noted inverted T waves which improved on repeat ECG after nitroglycerin was given.  She notes complete resolution of her symptoms.  When her chest heaviness was occurring she denies any shortness of breath, nausea, or vomiting but does endorse some mild diaphoresis.  Denies a history of previous MI or smoking.  States that her father passed away from an MI at 64 years of age.     Past Medical History:  Diagnosis Date   Arthritis    Hypertension    Hypothyroidism    Thyroid disease     Patient Active Problem List   Diagnosis Date Noted   Hypothyroidism 07/28/2021   Chest pain 07/28/2021   Obese 05/06/2017   S/P left TKA 05/04/2017   Essential hypertension, benign 01/02/2014    Past Surgical History:  Procedure Laterality Date   ABDOMINAL HYSTERECTOMY     APPENDECTOMY     KNEE SURGERY  2013   TOTAL KNEE ARTHROPLASTY Left 05/04/2017   Procedure: LEFT TOTAL KNEE ARTHROPLASTY;  Surgeon: Paralee Cancel, MD;  Location: [facility];  Service: Orthopedics;  Laterality: Left;  90 mins     OB History   No obstetric history on file.     Family History  Problem Relation Age of Onset   COPD Mother    Heart disease Mother    Heart disease Father    Cancer Father    COPD Brother    Hypertension Son     Social History   Tobacco Use   Smoking status: Former    Types: Cigarettes    Quit date: 04/28/1989    Years since quitting: 32.2   Smokeless tobacco: Never  Vaping Use   Vaping Use: Never used  Substance Use Topics   Alcohol use: No    Comment: Occas   Drug use: No    Home Medications Prior to Admission medications   Medication Sig Start Date End Date Taking? Authorizing Provider  acyclovir (ZOVIRAX) 400 MG tablet Take 400 mg by mouth 2 (two) times daily. 02/23/17  Yes [provider]  Calcium Carb-Cholecalciferol 600-800 MG-UNIT TABS Take 1 tablet by mouth daily.   Yes [provider]  fish oil-omega-3 fatty acids 1000 MG capsule Take 2,000 mg by mouth daily.    Yes [provider]  gabapentin (NEURONTIN) 300 MG capsule Take 300 mg by mouth at bedtime.   Yes [provider]  GLUCOSAMINE-CHONDROITIN-MSM-D3 PO Take 1 tablet by mouth daily.  Yes [provider]  hydrochlorothiazide (MICROZIDE) 12.5 MG capsule Take 12.5 mg by mouth daily. 02/27/17  Yes [provider]  levothyroxine (SYNTHROID, LEVOTHROID) 50 MCG tablet Take 50 mcg by mouth daily before breakfast. 02/17/17  Yes [provider]  lisinopril (PRINIVIL,ZESTRIL) 10 MG tablet Take 10 mg by mouth daily.   Yes [provider]  omeprazole (PRILOSEC) 40 MG capsule Take 40 mg by mouth daily.   Yes [provider]  butalbital-acetaminophen-caffeine (FIORICET WITH CODEINE) 50-325-40-30 MG capsule Take 1 capsule by mouth every 6 (six) hours as needed. For migraine headaches 02/11/17   [provider]  celecoxib (CELEBREX) 200 MG capsule Take 1  capsule (200 mg total) by mouth every 12 (twelve) hours. 05/06/17   Danae Orleans, PA-C  COVID-19 mRNA Vac-TriS, Pfizer, (PFIZER-BIONT COVID-19 VAC-TRIS) SUSP injection Inject into the muscle. 03/27/21   Carlyle Basques, MD  docusate sodium (COLACE) 100 MG capsule Take 1 capsule (100 mg total) by mouth 2 (two) times daily. 05/04/17   Danae Orleans, PA-C  ferrous sulfate (FERROUSUL) 325 (65 FE) MG tablet Take 1 tablet (325 mg total) by mouth 3 (three) times daily with meals. 05/04/17   Danae Orleans, PA-C  HYDROcodone-acetaminophen (NORCO) 7.5-325 MG tablet Take 1-2 tablets by mouth every 4 (four) hours as needed for moderate pain or severe pain. 05/06/17   Danae Orleans, PA-C  methocarbamol (ROBAXIN) 500 MG tablet Take 1 tablet (500 mg total) by mouth every 6 (six) hours as needed for muscle spasms. 05/06/17   Danae Orleans, PA-C  ondansetron (ZOFRAN) 4 MG tablet Take 1 tablet (4 mg total) by mouth every 6 (six) hours as needed for nausea. 05/06/17   Danae Orleans, PA-C  polyethylene glycol (MIRALAX / GLYCOLAX) packet Take 17 g by mouth 2 (two) times daily. 05/04/17   Danae Orleans, PA-C  rivaroxaban (XARELTO) 10 MG TABS tablet Take 1 tablet (10 mg total) by mouth daily. 05/07/17 05/19/17  Danae Orleans, PA-C  SUMAtriptan (IMITREX) 100 MG tablet Take 100 mg by mouth 2 (two) times daily as needed. For migraine headaches. 02/11/17   [provider]    Allergies    Albuterol  Review of Systems   Review of Systems  All other systems reviewed and are negative. Ten systems reviewed and are negative for acute change, except as noted in the HPI.   Physical Exam Updated Vital Signs BP (!) 164/95   Pulse 85   Temp 98.2 F (36.8 C) (Oral)   Resp 16   Ht '5\' 4"'$  (1.626 m)   Wt 79.4 kg   SpO2 95%   BMI 30.04 kg/m   Physical Exam Vitals and nursing note reviewed.  Constitutional:      General: She is not in acute distress.    Appearance: Normal appearance. She is not ill-appearing,  toxic-appearing or diaphoretic.  HENT:     Head: Normocephalic and atraumatic.     Right Ear: External ear normal.     Left Ear: External ear normal.     Nose: Nose normal.     Mouth/Throat:     Mouth: Mucous membranes are moist.     Pharynx: Oropharynx is clear. No oropharyngeal exudate or posterior oropharyngeal erythema.  Eyes:     Extraocular Movements: Extraocular movements intact.  Cardiovascular:     Rate and Rhythm: Normal rate and regular rhythm.     Pulses: Normal pulses.     Heart sounds: Normal heart sounds. Heart sounds not distant. No murmur heard. No systolic  murmur is present.  No diastolic murmur is present.    No friction rub. No gallop.  Pulmonary:     Effort: Pulmonary effort is normal. No tachypnea, accessory muscle usage or respiratory distress.     Breath sounds: Normal breath sounds. No stridor. No decreased breath sounds, wheezing, rhonchi or rales.  Abdominal:     General: Abdomen is flat.     Palpations: Abdomen is soft.     Tenderness: There is no abdominal tenderness.  Musculoskeletal:        General: Normal range of motion.     Cervical back: Normal range of motion and neck supple. No tenderness.  Skin:    General: Skin is warm and dry.  Neurological:     General: No focal deficit present.     Mental Status: She is alert and oriented to person, place, and time.  Psychiatric:        Mood and Affect: Mood normal.        Behavior: Behavior normal.   ED Results / Procedures / Treatments   Labs (all labs ordered are listed, but only abnormal results are displayed) Labs Reviewed  COMPREHENSIVE METABOLIC PANEL - Abnormal; Notable for the following components:      Result Value   Creatinine, Ser 1.10 (*)    Total Protein 6.2 (*)    GFR, Estimated 53 (*)    All other components within normal limits  CBC WITH DIFFERENTIAL/PLATELET - Abnormal; Notable for the following components:   Hemoglobin 15.1 (*)    MCV 103.2 (*)    MCH 34.2 (*)    All other  components within normal limits  RESP PANEL BY RT-PCR (FLU A&B, COVID) ARPGX2  RESP PANEL BY RT-PCR (FLU A&B, COVID) ARPGX2  MAGNESIUM  TSH  LIPID PANEL  HEMOGLOBIN A1C  TROPONIN I (HIGH SENSITIVITY)  TROPONIN I (HIGH SENSITIVITY)   EKG EKG Interpretation  Date/Time:  Sunday July 28 2021 17:09:22 EDT Ventricular Rate:  77 PR Interval:  159 QRS Duration: 100 QT Interval:  389 QTC Calculation: 441 R Axis:   -3 Text Interpretation: Sinus rhythm Low voltage, precordial leads Abnormal R-wave progression, late transition Confirmed by Octaviano Glow [phone]) on 07/28/2021 5:25:04 PM  Radiology DG Chest Portable 1 View  Result Date: 07/28/2021 CLINICAL DATA:  Chest pain. EXAM: PORTABLE CHEST 1 VIEW COMPARISON:  Mar 27, 2006 FINDINGS: Tortuosity and calcific atherosclerotic disease of the aorta. Cardiomediastinal silhouette is normal. Mediastinal contours appear intact. There is no evidence of focal airspace consolidation, pleural effusion or pneumothorax. Osseous structures are without acute abnormality. Soft tissues are grossly normal. IMPRESSION: 1. No active disease. 2. Tortuosity and calcific atherosclerotic disease of the aorta. Electronically Signed   By: Fidela Salisbury M.D.   On: 07/28/2021 17:59    Procedures Procedures   Medications Ordered in ED Medications  lisinopril (ZESTRIL) tablet 20 mg (has no administration in time range)  hydrochlorothiazide (MICROZIDE) capsule 25 mg (has no administration in time range)  levothyroxine (SYNTHROID) tablet 50 mcg (has no administration in time range)  pantoprazole (PROTONIX) EC tablet 40 mg (has no administration in time range)  gabapentin (NEURONTIN) capsule 300 mg (has no administration in time range)  nitroGLYCERIN (NITROSTAT) SL tablet 0.4 mg (has no administration in time range)  acetaminophen (TYLENOL) tablet 650 mg (has no administration in time range)  ondansetron (ZOFRAN) injection 4 mg (has no administration in time  range)  atorvastatin (LIPITOR) tablet 80 mg (has no administration in time range)  ED Course  I have reviewed the triage vital signs and the nursing notes.  Pertinent labs & imaging results that were available during my care of the patient were reviewed by me and considered in my medical decision making (see chart for details).  Clinical Course as of 07/28/21 [id number]  Nancy Fetter Jul 28, 2021  1803 This is a 70 year old female with a history of hypertension, hyperlipidemia, family history of MI in his father at age 76, presenting to emergency department with chest pain.  She reports she had an episode that began at rest today which felt like pressure in her left breast, radiated towards her left shoulder and left arm.  She denies lightheadedness, nausea or vomiting.  EMS was called to the scene and administered full dose aspirin as well as 3 sublingual nitroglycerin, the patient reports complete resolution of her chest pain following nitroglycerin.  EMS had reported inverted T waves which corrected after nitroglycerin.  The patient reports had a similar episode 2 weeks ago which also occurred at rest, but resolved on its own.  She has never had a cardiac work-up and does not have any known cardiac disease.  She is a non-smoker.  She denies any other significant medical problems.  Here in the ED her vital signs are normal aside from some mild hypertension.  She is not hypoxic.  She appears comfortable.  Her lungs are clear to auscultation.  Her EKG shows a normal sinus rhythm with no ST elevations, single T wave inversion in V1.  We are awaiting troponins.  Her clinical history is quite concerning for coronary syndrome or unstable angina.  We will discuss the case with cardiology. [MT]  1830 Troponin I (High Sensitivity): 4 [MT]  1918 Patient discussed with cardiology.  They will evaluate the patient and likely admit her to the cardiology service. [LJ]  2003 Patient remains pain-free on my reassessment.   Systolic blood pressure is 123mhg in the room [MT]    Clinical Course User Index [LJ] JRayna Sexton PA-C [MT] TLangston MaskerMCarola Rhine MD   MDM Rules/Calculators/A&P                          Pt is a 70y.o. female who presents to the emergency department due to an episode of chest pain that began about 2 hours prior to arrival.  EMS notified and patient given NTG x3 as well as 324 mg of ASA prior to arrival with complete resolution of her symptoms.  Labs: CBC with a hemoglobin of 15.1, MCV of 103.2, MCH of 34.2. CMP with a creatinine of 1.1, total protein of 6.2, GFR 53. Respiratory panel is negative. Magnesium 1.9. Troponin of 4 with a repeat of 5.  Imaging: Chest x-ray shows no active disease.  Tortuosity and calcific atherosclerotic disease of the aorta.  I, LRayna Sexton PA-C, personally reviewed and evaluated these images and lab results as part of my medical decision-making.  Patient without a history of MI but notes an MI with her father at the age of 89  Non-smoker.  History of hypertension and hyperlipidemia.  History concerning today for a likely cardiac cause.  Elevated heart score of 6.  Patient discussed with cardiology who has evaluated the patient and agrees with admission.  She will be admitted to the cardiology service for further evaluation.   Note: Portions of this report may have been transcribed using voice recognition software. Every effort was made to ensure accuracy; however, inadvertent  computerized transcription errors may be present.   Final Clinical Impression(s) / ED Diagnoses Final diagnoses:  Chest pain, unspecified type   Rx / DC Orders ED Discharge Orders     None        Rayna Sexton, PA-C 07/28/21 2101    Wyvonnia Dusky, MD 07/28/21 2248

## 2021-07-29 ENCOUNTER — Observation Stay (HOSPITAL_COMMUNITY): Payer: Medicare Other

## 2021-07-29 ENCOUNTER — Other Ambulatory Visit: Payer: Self-pay | Admitting: Cardiovascular Disease

## 2021-07-29 ENCOUNTER — Encounter (HOSPITAL_COMMUNITY): Payer: Self-pay | Admitting: Student in an Organized Health Care Education/Training Program

## 2021-07-29 ENCOUNTER — Observation Stay (HOSPITAL_BASED_OUTPATIENT_CLINIC_OR_DEPARTMENT_OTHER): Payer: Medicare Other

## 2021-07-29 ENCOUNTER — Telehealth: Payer: Self-pay

## 2021-07-29 ENCOUNTER — Other Ambulatory Visit: Payer: Self-pay | Admitting: Home Health

## 2021-07-29 DIAGNOSIS — I2510 Atherosclerotic heart disease of native coronary artery without angina pectoris: Secondary | ICD-10-CM | POA: Diagnosis not present

## 2021-07-29 DIAGNOSIS — R079 Chest pain, unspecified: Secondary | ICD-10-CM | POA: Diagnosis not present

## 2021-07-29 DIAGNOSIS — R072 Precordial pain: Secondary | ICD-10-CM | POA: Diagnosis not present

## 2021-07-29 DIAGNOSIS — I10 Essential (primary) hypertension: Secondary | ICD-10-CM | POA: Diagnosis not present

## 2021-07-29 LAB — HEMOGLOBIN A1C
Hgb A1c MFr Bld: 4.8 % (ref 4.8–5.6)
Hgb A1c MFr Bld: 5.1 % (ref 4.8–5.6)
Mean Plasma Glucose: 91.06 mg/dL
Mean Plasma Glucose: 99.67 mg/dL

## 2021-07-29 LAB — LIPID PANEL
Cholesterol: 205 mg/dL — ABNORMAL HIGH (ref 0–200)
HDL: 75 mg/dL (ref >40)
LDL Cholesterol: 114 mg/dL — ABNORMAL HIGH (ref 0–99)
Total CHOL/HDL Ratio: 2.7 RATIO
Triglycerides: 80 mg/dL (ref <150)
VLDL: 16 mg/dL (ref 0–40)

## 2021-07-29 LAB — ECHOCARDIOGRAM COMPLETE
Area-P 1/2: 3.31 cm2
Calc EF: 66.9 %
Height: 64 in
S' Lateral: 2.9 cm
Single Plane A2C EF: 73 %
Single Plane A4C EF: 60.7 %
Weight: 2800 oz

## 2021-07-29 LAB — TSH: TSH: 1.579 u[IU]/mL (ref 0.350–4.500)

## 2021-07-29 MED ORDER — IOHEXOL 350 MG/ML SOLN
95.0000 mL | Freq: Once | INTRAVENOUS | Status: AC | PRN
  Administered 2021-07-29: 95 mL via INTRAVENOUS

## 2021-07-29 MED ORDER — ASPIRIN 81 MG PO CHEW
81.0000 mg | CHEWABLE_TABLET | Freq: Once | ORAL | Status: AC
  Administered 2021-07-29: 81 mg via ORAL
  Filled 2021-07-29: qty 1

## 2021-07-29 MED ORDER — NITROGLYCERIN 0.4 MG SL SUBL
SUBLINGUAL_TABLET | SUBLINGUAL | Status: AC
  Filled 2021-07-29: qty 2

## 2021-07-29 MED ORDER — METOPROLOL SUCCINATE ER 25 MG PO TB24: 25.0000 mg | ORAL_TABLET | Freq: Every day | ORAL | 1 refills | Status: DC

## 2021-07-29 MED ORDER — HYDROCHLOROTHIAZIDE 12.5 MG PO CAPS: 25.0000 mg | ORAL_CAPSULE | Freq: Every day | ORAL | 1 refills | Status: DC

## 2021-07-29 MED ORDER — NITROGLYCERIN 0.4 MG SL SUBL
0.8000 mg | SUBLINGUAL_TABLET | Freq: Once | SUBLINGUAL | Status: AC
  Administered 2021-07-29: 0.8 mg via SUBLINGUAL

## 2021-07-29 MED ORDER — ASPIRIN EC 81 MG PO TBEC: 81.0000 mg | DELAYED_RELEASE_TABLET | Freq: Every day | ORAL | 1 refills | Status: DC

## 2021-07-29 MED ORDER — LISINOPRIL 20 MG PO TABS: 20.0000 mg | ORAL_TABLET | Freq: Every day | ORAL | 1 refills | Status: DC

## 2021-07-29 MED ORDER — NITROGLYCERIN 0.4 MG SL SUBL: 0.4000 mg | SUBLINGUAL_TABLET | SUBLINGUAL | 1 refills | Status: AC | PRN

## 2021-07-29 MED ORDER — ATORVASTATIN CALCIUM 80 MG PO TABS: 40.0000 mg | ORAL_TABLET | Freq: Every day | ORAL | 1 refills | Status: DC

## 2021-07-29 MED ORDER — ENOXAPARIN SODIUM 40 MG/0.4ML IJ SOSY
40.0000 mg | PREFILLED_SYRINGE | Freq: Every day | INTRAMUSCULAR | Status: DC
  Administered 2021-07-29: 40 mg via SUBCUTANEOUS
  Filled 2021-07-29: qty 0.4

## 2021-07-29 MED ORDER — METOPROLOL TARTRATE 25 MG PO TABS
100.0000 mg | ORAL_TABLET | Freq: Once | ORAL | Status: AC
  Administered 2021-07-29: 100 mg via ORAL
  Filled 2021-07-29: qty 4

## 2021-07-29 NOTE — Telephone Encounter (Signed)
Called patient, scheduled patient with Dr.O'Neal.  Patient made aware of upcoming appointment in October.  Patient verbalized understanding.

## 2021-07-29 NOTE — Telephone Encounter (Signed)
-----   Message from Margie Billet, NP sent at 07/29/2021  2:17 PM EDT ----- Regarding: appt Hi Almyra Free  Dr Audie Box want to see this patient in 3-4 weeks, OK to double book him, please help arrange,  thank you.   Tonny Bollman

## 2021-07-29 NOTE — Discharge Summary (Signed)
Discharge Summary    Patient ID: Stephanie Daniel MRN: [id number]; DOB: [birth year]  Admit date: 07/28/2021 Discharge date: 07/29/2021  PCP:  Kelton Pillar, MD   [place] [facility] Cardiologist:  Evalina Field, MD   {  Discharge Diagnoses    Principal Problem:   Chest pain Active Problems:   Essential hypertension, benign   Hypothyroidism   CAD (coronary artery disease)    Diagnostic Studies/Procedures    CTA coronary 07/29/21:  IMPRESSION: 1. Coronary calcium score of 2165. This was 99th percentile for age-, race-, and sex-matched controls.   2. Normal coronary origin with right dominance.   3. Diffuse coronary atherosclerosis with mild plaque in the distal LM, RCA and LCX.   4. Severe (>70%) plaque in the LAD. However, this is heavily calcified and may be over estimated due to blooming artifact. Will send the study for FFRct.   5.  CAD-RADS 4.   6.  Atherosclerosis of the aorta.  CTA FFR study official report pending, no significant stenosis per Dr Audie Box.    Echo from 07/29/21:   1. Left ventricular ejection fraction, by estimation, is 60 to 65%. Left  ventricular ejection fraction by 3D volume is 62 %. The left ventricle has  normal function. The left ventricle has no regional wall motion  abnormalities. Left ventricular diastolic parameters are consistent with Grade I diastolic dysfunction (impaired relaxation).   2. Right ventricular systolic function is low normal. The right  ventricular size is normal.   3. Anterior fat pad noted overlying the RV.   4. The mitral valve is grossly normal. Trivial mitral valve  regurgitation.   5. The aortic valve is tricuspid. Aortic valve regurgitation is not  visualized.   6. The inferior vena cava is normal in size with greater than 50%  respiratory variability, suggesting right atrial pressure of 3 mmHg.   Comparison(s): No prior Echocardiogram.  _____________   History of Present Illness     Per  admission H&P:  Stephanie Daniel is a 70 y.o. female with PMH of HTN, hypothyroidism, obesity, who presented to ER on 07/28/21 for chest pain.   She experienced acute onset chest pressure at rest for which she called EMS.  On EMS arrival she was given 3 sublingual nitroglycerin and aspirin loaded with 324 mg p.o.  Her initial blood pressure was 222/112 which improved to 166/92 after nitroglycerin.  Her chest pressure subsided following 3 SNLG and improved with each subsequent nitro before completely going away within 15 minutes.  She has HTN and hypothyroidism but otherwise no known coronary disease.  07/28/21 she reported that she was sitting in her chair reading a book when she had gradually worsening bilateral left shoulder pain which fairly quickly progressed to anterior central chest pressure of 6/10 severity.  This lasted for around 1.5 hours before going away with EMS arrival and sublingual nitroglycerin.  Symptom onset was at 1548 on 09/11.   She had a similar episode around 4 weeks ago which lasted for around 2 hours and for which she took 2 baby aspirin.  She was not sure whether the aspirin had any effect on the chest pressure but it went away within 30 minutes or so afterwards.  During that episode she was sitting on her couch watching TV when she had fairly acute onset 8/10 chest pressure without radiation similar to the episode on 07/28/21 but more severe.  She did not seek evaluation during that episode was the first when she had  ever had.  She denied any recent trauma or muscle injury.   There was a questionable family history of CAD in her father however she said that this was just based off the death certificate which listed a heart attack.  He had end-stage cirrhosis and his doctors knew that he was likely going to pass within a few weeks of an office visit.  He was at home and still fairly functional when he fell over and suddenly passed.  He did not have evaluation at hospital or an autopsy  to comment on an MRI as the definitive cause of death.   Stephanie Daniel has 1 son who unfortunately passed away at age 47 from a rafting accident.  She has no other kids but does have a granddaughter of her son who lives around 30 minutes away.  She is divorced and currently lives alone.  She is retired.  She is still very functional and has no limitations to activities at baseline.   She denied any orthopnea, SOB, DOE, lower extremity edema, weight gain, or diaphoresis associated with these episodes or in the recent past.   She follows with [facility] with Dr. Kelton Pillar.  She does not regularly check her blood pressure but says that in the office it is usually little bit high in the 150s although is never been in the 200s.  She is on lisinopril and HCTZ low dose and takes them in the morning and took both doses prior to her episode today.  She has not missed any of her medications and has a list on her phone.   She has distant prior tobacco use but is no longer using.  She is not followed by cardiology.  She has never had prior cardiac evaluation and denies any previous echo or cath.  Heart score of 5 so ED consulted for expedited stress testing given concerning story despite initial negative troponin.   VS at admission with AP 84, BP 188/122 (141), SpO2 98% RA, RR 21. She was asx despite HTN.    Hospital Course     Consultants: N/A   Chest pain Non obstructive CAD  - presented with chest pain at rest, improved with nitro, in the setting og HTN urgency  - HS trop 4 >5 - EKG without acute ischemic changes, poor R wave progression  - Echo 07/29/21 showed EF 60-65%, no RWMA, grade I DD, RV systolic function low normal, trivial MR  - LDL 114 and A1C 5.1% from 07/29/21  - Coronary CTA 07/29/21 showed Coronary calcium score of 2165. This was 99th percentile for age-, race-, and sex-matched controls. Normal coronary origin with right dominance. Diffuse coronary atherosclerosis with mild  plaque in the distal LM, RCA and LCX. Severe (>70%) plaque in the LAD. However, this is heavily calcified and may be over estimated due to blooming artifact.CAD-RADS 4. Atherosclerosis of the aorta. FFR study showed no significant stenosis per Dr Audie Box, official report pending  - Medical therapy: start ASA '81mg'$  daily, atorvastatin '40mg'$  daily, and metorpolol XL '25mg'$  daily during this admission, continue home med lisinopril, PRN nitro provided; all scripts sent to her pharmacy (stop meloxicam, not on Xarelto/confirmed with patient) - Follow up with Dr Audie Box in 3-4 weeks, message sent to his office RN will arrange/double book the appointment   Hypertensive urgency  - admission BP up to [id number] systolic  - on PTA HCTZ 12.'5mg'$  daily and lisinopril '10mg'$  daily  - HCTZ increased to '25mg'$  daily, lisinopril increased to '20mg'$   daily, added metoprolol XL '25mg'$  daily  - advised the patient to monitor BP at home, bring logs to cardiology follow up appointment for review   HLD - started Lipitor '40mg'$  daily, LDL 114, repeat lipid and LFT in 4-6 weeks   Hypothyroidism - TSH WNL 07/29/21 - continued on levothyroxine   Chronic pain Depression/anxiety  GERD - chronic meds unchanged , follow up with PCP      Did the patient have an acute coronary syndrome (MI, NSTEMI, STEMI, etc) this admission?:  No                               Did the patient have a percutaneous coronary intervention (stent / angioplasty)?:  No.       _____________  Discharge Vitals Blood pressure 114/76, pulse (!) 57, temperature 98.2 F (36.8 C), temperature source Oral, resp. rate (!) 22, height '5\' 4"'$  (1.626 m), weight 79.4 kg, SpO2 94 %.  Filed Weights   07/28/21 1711  Weight: 79.4 kg    Labs & Radiologic Studies    CBC Recent Labs    07/28/21 1715  WBC 7.0  NEUTROABS 4.2  HGB 15.1*  HCT 45.6  MCV 103.2*  PLT 0000000   Basic Metabolic Panel Recent Labs    07/28/21 1715  NA 137  K 4.0  CL 104  CO2 22  GLUCOSE 92   BUN 12  CREATININE 1.10*  CALCIUM 9.2  MG 1.9   Liver Function Tests Recent Labs    07/28/21 1715  AST 22  ALT 20  ALKPHOS 50  BILITOT 0.7  PROT 6.2*  ALBUMIN 3.6   No results for input(s): LIPASE, AMYLASE in the last 72 hours. High Sensitivity Troponin:   Recent Labs  Lab 07/28/21 1715 07/28/21 1914  TROPONINIHS 4 5    BNP Invalid input(s): POCBNP D-Dimer No results for input(s): DDIMER in the last 72 hours. Hemoglobin A1C Recent Labs    07/29/21 0610  HGBA1C 5.1   Fasting Lipid Panel Recent Labs    07/29/21 0610  CHOL 205*  HDL 75  LDLCALC 114*  TRIG 80  CHOLHDL 2.7   Thyroid Function Tests Recent Labs    07/29/21 0610  TSH 1.579   _____________  CT CORONARY MORPH W/CTA COR W/SCORE W/CA W/CM &/OR WO/CM  Addendum Date: 07/29/2021   ADDENDUM REPORT: 07/29/2021 12:13 CLINICAL DATA:  47F with hypertension and chest pain. EXAM: Cardiac/Coronary  CT TECHNIQUE: The patient was scanned on a [employer]. FINDINGS: A 120 kV prospective scan was triggered in the descending thoracic aorta at 111 HU's. Axial non-contrast 3 mm slices were carried out through the heart. The data set was analyzed on a dedicated work station and scored using the [place]. Gantry rotation speed was 250 msecs and collimation was .6 mm. No beta blockade and 0.8 mg of sl NTG was given. The 3D data set was reconstructed in 5% intervals of the 67-82 % of the R-R cycle. Diastolic phases were analyzed on a dedicated work station using MPR, MIP and VRT modes. The patient received 80 cc of contrast. Aorta: Normal size. Ascending aorta 3.5 cm. Calcification in the aortic root and descending aorta. No dissection. Aortic Valve:  Trileaflet.  No calcifications. Coronary Arteries:  Normal coronary origin.  Right dominance. RCA is a large dominant artery that gives rise to PDA and 3 PLV branches. There is diffuse mild (25-49%) calcified plaque in the proximal  and mid RCA. There is minimal  (<25%) plaque in the distal RCA. Left main is a large artery that gives rise to LAD, RI, and LCX arteries. There is mild (25-49%) calcified plaque and the distal LM. LAD is a large vessel that is diffusely diseased. There is severe (>70%) calcified plaque proximally and in the mid LAD just distal to D1. D1 has moderate (50-69%) calcified plaque. RI is a heavily calcified vessel with moderate (50-69%) plaque proximally. LCX is a non-dominant artery that gives rise to one large OM1 branch. There is mild (25-49%) calcified plaque proximally and minimal (<25%) calcified plaque distally. There are two small OM vessels without significant plaque. Coronary Calcium Score: Left main: 3.89 Left anterior descending artery: 870 Left circumflex artery: 418 Right coronary artery: 873 Total: 2165 Percentile: 99th Other findings: Normal pulmonary vein drainage into the left atrium. Normal let atrial appendage without a thrombus. Normal size of the pulmonary artery. IMPRESSION: 1. Coronary calcium score of 2165. This was 99th percentile for age-, race-, and sex-matched controls. 2. Normal coronary origin with right dominance. 3. Diffuse coronary atherosclerosis with mild plaque in the distal LM, RCA and LCX. 4. Severe (>70%) plaque in the LAD. However, this is heavily calcified and may be over estimated due to blooming artifact. Will send the study for FFRct. 5.  CAD-RADS 4. 6.  Atherosclerosis of the aorta. Skeet Latch, MD Electronically Signed   By: Skeet Latch M.D.   On: 07/29/2021 12:13   Result Date: 07/29/2021 EXAM: OVER-READ INTERPRETATION  CT CHEST The following report is an over-read performed by radiologist Dr. Rolm Baptise of [facility], [place] on 07/29/2021. This over-read does not include interpretation of cardiac or coronary anatomy or pathology. The coronary CTA interpretation by the cardiologist is attached. COMPARISON:  04/07/2006 FINDINGS: Vascular: Heart is normal size. Aorta normal caliber.  Scattered calcified and noncalcified plaque throughout the aorta. Mediastinum/Nodes: No adenopathy Lungs/Pleura: No confluent opacity or effusion. Linear scarring peripherally in the lower lobes. Upper Abdomen: Imaging into the upper abdomen demonstrates no acute findings. Musculoskeletal: Bilateral breast implants. Chest wall soft tissues are unremarkable. No acute bony abnormality. IMPRESSION: Scattered calcified and noncalcified aortic plaque.  No aneurysm. No acute extra cardiac abnormality. Electronically Signed: By: Rolm Baptise M.D. On: 07/29/2021 09:12   DG Chest Portable 1 View  Result Date: 07/28/2021 CLINICAL DATA:  Chest pain. EXAM: PORTABLE CHEST 1 VIEW COMPARISON:  Mar 27, 2006 FINDINGS: Tortuosity and calcific atherosclerotic disease of the aorta. Cardiomediastinal silhouette is normal. Mediastinal contours appear intact. There is no evidence of focal airspace consolidation, pleural effusion or pneumothorax. Osseous structures are without acute abnormality. Soft tissues are grossly normal. IMPRESSION: 1. No active disease. 2. Tortuosity and calcific atherosclerotic disease of the aorta. Electronically Signed   By: Fidela Salisbury M.D.   On: 07/28/2021 17:59   ECHOCARDIOGRAM COMPLETE  Result Date: 07/29/2021    ECHOCARDIOGRAM REPORT   Patient Name:   Stephanie Daniel Date of Exam: 07/29/2021 Medical Rec #:  [id number]      Height:       64.0 in Accession #:    [id number]     Weight:       175.0 lb Date of Birth:  05-26-1951      BSA:          1.848 m Patient Age:    70 years       BP:           114/76 mmHg Patient Gender: F  HR:           66 bpm. Exam Location:  Inpatient Procedure: 2D Echo, 3D Echo, Cardiac Doppler and Color Doppler Indications:    R07.9* Chest pain, unspecified  History:        Patient has no prior history of Echocardiogram examinations.                 Signs/Symptoms:Chest Pain.  Sonographer:    Roseanna Rainbow RDCS Referring Phys: [id number] [place]   Sonographer Comments: Technically difficult study due to poor echo windows. Image acquisition challenging due to patient body habitus and Image acquisition challenging due to breast implants. IMPRESSIONS  1. Left ventricular ejection fraction, by estimation, is 60 to 65%. Left ventricular ejection fraction by 3D volume is 62 %. The left ventricle has normal function. The left ventricle has no regional wall motion abnormalities. Left ventricular diastolic  parameters are consistent with Grade I diastolic dysfunction (impaired relaxation).  2. Right ventricular systolic function is low normal. The right ventricular size is normal.  3. Anterior fat pad noted overlying the RV.  4. The mitral valve is grossly normal. Trivial mitral valve regurgitation.  5. The aortic valve is tricuspid. Aortic valve regurgitation is not visualized.  6. The inferior vena cava is normal in size with greater than 50% respiratory variability, suggesting right atrial pressure of 3 mmHg. Comparison(s): No prior Echocardiogram. FINDINGS  Left Ventricle: Left ventricular ejection fraction, by estimation, is 60 to 65%. Left ventricular ejection fraction by 3D volume is 62 %. The left ventricle has normal function. The left ventricle has no regional wall motion abnormalities. The left ventricular internal cavity size was normal in size. There is no left ventricular hypertrophy. Left ventricular diastolic parameters are consistent with Grade I diastolic dysfunction (impaired relaxation). Indeterminate filling pressures. Right Ventricle: The right ventricular size is normal. No increase in right ventricular wall thickness. Right ventricular systolic function is low normal. Left Atrium: Left atrial size was normal in size. Right Atrium: Right atrial size was normal in size. Pericardium: Anterior fat pad noted overlying the RV. There is no evidence of pericardial effusion. Presence of pericardial fat pad. Mitral Valve: The mitral valve is grossly  normal. Trivial mitral valve regurgitation. Tricuspid Valve: The tricuspid valve is grossly normal. Tricuspid valve regurgitation is trivial. Aortic Valve: The aortic valve is tricuspid. Aortic valve regurgitation is not visualized. Pulmonic Valve: The pulmonic valve was normal in structure. Pulmonic valve regurgitation is not visualized. Aorta: The aortic root and ascending aorta are structurally normal, with no evidence of dilitation. Venous: The inferior vena cava is normal in size with greater than 50% respiratory variability, suggesting right atrial pressure of 3 mmHg. IAS/Shunts: No atrial level shunt detected by color flow Doppler.  LEFT VENTRICLE PLAX 2D LVIDd:         4.20 cm         Diastology LVIDs:         2.90 cm         LV e' medial:    7.18 cm/s LV PW:         0.80 cm         LV E/e' medial:  8.4 LV IVS:        0.90 cm         LV e' lateral:   7.62 cm/s LVOT diam:     1.80 cm         LV E/e' lateral: 7.9 LV SV:  58 LV SV Index:   32 LVOT Area:     2.54 cm        3D Volume EF                                LV 3D EF:    Left                                             ventricul LV Volumes (MOD)                            ar LV vol d, MOD    30.4 ml                    ejection A2C:                                        fraction LV vol d, MOD    46.0 ml                    by 3D A4C:                                        volume is LV vol s, MOD    8.2 ml                     62 %. A2C: LV vol s, MOD    18.1 ml A4C:                           3D Volume EF: LV SV MOD A2C:   22.2 ml       3D EF:        62 % LV SV MOD A4C:   46.0 ml       LV EDV:       70 ml LV SV MOD BP:    25.1 ml       LV ESV:       26 ml                                LV SV:        44 ml RIGHT VENTRICLE            IVC RV S prime:     9.90 cm/s  IVC diam: 1.00 cm TAPSE (M-mode): 2.6 cm LEFT ATRIUM             Index       RIGHT ATRIUM           Index LA diam:        3.40 cm 1.84 cm/m  RA Area:     13.80 cm LA Vol (A2C):   32.1 ml  17.37 ml/m RA Volume:   32.50 ml  17.58 ml/m LA Vol (A4C):   27.6 ml 14.93 ml/m LA Biplane Vol: 31.6 ml 17.10 ml/m  AORTIC VALVE LVOT Vmax:   101.00 cm/s  LVOT Vmean:  65.500 cm/s LVOT VTI:    0.229 m  AORTA Ao Root diam: 2.80 cm Ao Asc diam:  3.80 cm MITRAL VALVE MV Area (PHT): 3.31 cm    SHUNTS MV Decel Time: 229 msec    Systemic VTI:  0.23 m MV E velocity: 60.30 cm/s  Systemic Diam: 1.80 cm MV A velocity: 76.10 cm/s MV E/A ratio:  0.79 Lyman Bishop MD Electronically signed by Lyman Bishop MD Signature Date/Time: 07/29/2021/12:34:46 PM    Final    Disposition   Patient was seen by Dr Audie Box during rounding, see progress notes for physical exam. CTA and Echo results reviewed by Dr Audie Box with the patient, patient was deemed stable for discharge. Medication changes reviewed with the patient over the phone.  All questions answered to satisfaction over the phone.  Advised patient to anticipate call from Dr. Drucilla Schmidt office for arranging follow-up appointment.  Patient is stable for discharge to home today. Follow-up Plans & Appointments     Discharge Instructions     Diet - low sodium heart healthy   Complete by: As directed    Discharge instructions   Complete by: As directed    Please take your medications as prescribed  Please monitor your Blood Pressure daily at home, bring logs to follow up appt for review  Our office staff will call you arrange appointment with Dr Audie Box for cardiology follow up   Increase activity slowly   Complete by: As directed        Discharge Medications   Allergies as of 07/29/2021       Reactions   Albuterol Other (See Comments)   Respiratory distress        Medication List     STOP taking these medications    celecoxib 200 MG capsule Commonly known as: CELEBREX   docusate sodium 100 MG capsule Commonly known as: Colace   ferrous sulfate 325 (65 FE) MG tablet Commonly known as: FerrouSul   GLUCOSAMINE-CHONDROITIN-MSM-D3 PO    HYDROcodone-acetaminophen 7.5-325 MG tablet Commonly known as: Norco   meloxicam 15 MG tablet Commonly known as: MOBIC   methocarbamol 500 MG tablet Commonly known as: Robaxin   ondansetron 4 MG tablet Commonly known as: ZOFRAN   polyethylene glycol 17 g packet Commonly known as: MIRALAX / GLYCOLAX   rivaroxaban 10 MG Tabs tablet Commonly known as: Xarelto       TAKE these medications    acyclovir 400 MG tablet Commonly known as: ZOVIRAX Take 400 mg by mouth 2 (two) times daily.   aspirin EC 81 MG tablet Take 1 tablet (81 mg total) by mouth daily. Swallow whole.   atorvastatin 80 MG tablet Commonly known as: LIPITOR Take 0.5 tablets (40 mg total) by mouth at bedtime.   buPROPion 150 MG 24 hr tablet Commonly known as: WELLBUTRIN XL Take 150 mg by mouth daily.   butalbital-acetaminophen-caffeine 50-325-40-30 MG capsule Commonly known as: FIORICET WITH CODEINE Take 1 capsule by mouth every 6 (six) hours as needed. For migraine headaches   Calcium Carb-Cholecalciferol 600-800 MG-UNIT Tabs Take 1 tablet by mouth daily.   fish oil-omega-3 fatty acids 1000 MG capsule Take 2,000 mg by mouth daily.   gabapentin 300 MG capsule Commonly known as: NEURONTIN Take 300 mg by mouth at bedtime.   hydrochlorothiazide 12.5 MG capsule Commonly known as: MICROZIDE Take 2 capsules (25 mg total) by mouth daily. Start taking on: July 30, 2021 What changed: how much to take   levothyroxine 50 MCG  tablet Commonly known as: SYNTHROID Take 50 mcg by mouth daily before breakfast.   lisinopril 20 MG tablet Commonly known as: ZESTRIL Take 1 tablet (20 mg total) by mouth daily. Start taking on: July 30, 2021 What changed:  medication strength how much to take   metoprolol succinate 25 MG 24 hr tablet Commonly known as: Toprol XL Take 1 tablet (25 mg total) by mouth daily.   nitroGLYCERIN 0.4 MG SL tablet Commonly known as: NITROSTAT Place 1 tablet (0.4 mg  total) under the tongue every 5 (five) minutes x 3 doses as needed for chest pain.   omeprazole 40 MG capsule Commonly known as: PRILOSEC Take 40 mg by mouth daily.   Pfizer-BioNT COVID-19 Vac-TriS Susp injection Generic drug: COVID-19 mRNA Vac-TriS (Pfizer) Inject into the muscle.           Outstanding Labs/Studies   Consider repeat lipid panel and LFTs in 4 to 6 weeks   Duration of Discharge Encounter   Greater than 30 minutes including physician time.  Signed, Margie Billet, NP 07/29/2021, 2:52 PM

## 2021-07-29 NOTE — Progress Notes (Signed)
  Echocardiogram 2D Echocardiogram has been performed.  Stephanie Daniel 07/29/2021, 11:28 AM

## 2021-07-29 NOTE — ED Notes (Signed)
Patient to CT.

## 2021-07-29 NOTE — Progress Notes (Signed)
Cardiology Progress Note  Patient ID: Stephanie Daniel MRN: [id number] DOB: 28-Nov-1950 Date of Encounter: 07/29/2021  Primary Cardiologist: None  Subjective   Chief Complaint: None.  HPI: Admitted with back pain that radiated into her chest yesterday.  Was associated with hypertension on arrival to the ER.  Troponins are negative.  EKG is normal.  Symptoms are possibly cardiac related.  Coronary CTA has been ordered.  ROS:  All other ROS reviewed and negative. Pertinent positives noted in the HPI.     Inpatient Medications  Scheduled Meds:  atorvastatin  80 mg Oral QHS   gabapentin  300 mg Oral QHS   hydrochlorothiazide  25 mg Oral Daily   levothyroxine  50 mcg Oral QAC breakfast   lisinopril  20 mg Oral Daily   pantoprazole  40 mg Oral Daily   Continuous Infusions:  PRN Meds: acetaminophen, nitroGLYCERIN, ondansetron (ZOFRAN) IV   Vital Signs   Vitals:   07/29/21 0430 07/29/21 0515 07/29/21 0600 07/29/21 0630  BP: 114/75 128/77 140/84 119/71  Pulse: 76 81 84 76  Resp: (!) 21 (!) 22 (!) 22 (!) 21  Temp:      TempSrc:      SpO2: 91% 92% 95% 92%  Weight:      Height:       No intake or output data in the 24 hours ending 07/29/21 0753 Last 3 Weights 07/28/2021 05/04/2017 05/04/2017  Weight (lbs) 175 lb 176 lb 176 lb  Weight (kg) 79.379 kg 79.833 kg 79.833 kg      Telemetry  Overnight telemetry shows sinus rhythm in the 70s, which I personally reviewed.   ECG  The most recent ECG shows sinus rhythm heart rate 77, no acute ischemic changes or evidence of infarction, which I personally reviewed.   Physical Exam   Vitals:   07/29/21 0430 07/29/21 0515 07/29/21 0600 07/29/21 0630  BP: 114/75 128/77 140/84 119/71  Pulse: 76 81 84 76  Resp: (!) 21 (!) 22 (!) 22 (!) 21  Temp:      TempSrc:      SpO2: 91% 92% 95% 92%  Weight:      Height:       No intake or output data in the 24 hours ending 07/29/21 0753  Last 3 Weights 07/28/2021 05/04/2017 05/04/2017  Weight (lbs)  175 lb 176 lb 176 lb  Weight (kg) 79.379 kg 79.833 kg 79.833 kg    Body mass index is 30.04 kg/m.  General: Well nourished, well developed, in no acute distress Head: Atraumatic, normal size  Eyes: PEERLA, EOMI  Neck: Supple, no JVD Endocrine: No thryomegaly Cardiac: Normal S1, S2; RRR; no murmurs, rubs, or gallops Lungs: Clear to auscultation bilaterally, no wheezing, rhonchi or rales  Abd: Soft, nontender, no hepatomegaly  Ext: No edema, pulses 2+ Musculoskeletal: No deformities, BUE and BLE strength normal and equal Skin: Warm and dry, no rashes   Neuro: Alert and oriented to person, place, time, and situation, CNII-XII grossly intact, no focal deficits  Psych: Normal mood and affect   Labs  High Sensitivity Troponin:   Recent Labs  Lab 07/28/21 1715 07/28/21 1914  TROPONINIHS 4 5     Cardiac EnzymesNo results for input(s): TROPONINI in the last 168 hours. No results for input(s): TROPIPOC in the last 168 hours.  Chemistry Recent Labs  Lab 07/28/21 1715  NA 137  K 4.0  CL 104  CO2 22  GLUCOSE 92  BUN 12  CREATININE 1.10*  CALCIUM 9.2  PROT 6.2*  ALBUMIN 3.6  AST 22  ALT 20  ALKPHOS 50  BILITOT 0.7  GFRNONAA 53*  ANIONGAP 11    Hematology Recent Labs  Lab 07/28/21 1715  WBC 7.0  RBC 4.42  HGB 15.1*  HCT 45.6  MCV 103.2*  MCH 34.2*  MCHC 33.1  RDW 15.2  PLT 254   BNPNo results for input(s): BNP, PROBNP in the last 168 hours.  DDimer No results for input(s): DDIMER in the last 168 hours.   Radiology  DG Chest Portable 1 View  Result Date: 07/28/2021 CLINICAL DATA:  Chest pain. EXAM: PORTABLE CHEST 1 VIEW COMPARISON:  Mar 27, 2006 FINDINGS: Tortuosity and calcific atherosclerotic disease of the aorta. Cardiomediastinal silhouette is normal. Mediastinal contours appear intact. There is no evidence of focal airspace consolidation, pleural effusion or pneumothorax. Osseous structures are without acute abnormality. Soft tissues are grossly normal.  IMPRESSION: 1. No active disease. 2. Tortuosity and calcific atherosclerotic disease of the aorta. Electronically Signed   By: Fidela Salisbury M.D.   On: 07/28/2021 17:59    Cardiac Studies  Coronary CTA pending  Patient Profile  Stephanie Daniel is a 70 y.o. female with hypertension and hypothyroidism who was admitted on 07/28/2021 with acute chest pain.  Assessment & Plan   #Chest pain, possibly cardiac -Presented with pain at rest in her back that radiated into her chest.  Symptoms were nonexertional.  Symptoms have resolved.  Associated with severely elevated blood pressure.  Possibly this was the problem. -EKG is negative.  Troponins are negative for an acute coronary syndrome.  She is comfortably resting without any further symptoms.  Telemetry is unremarkable. -Suspect this is GI or musculoskeletal. -We will proceed with coronary CTA.  She will take 100 mg of metoprolol tartrate.  She will need an 18-gauge diffusics catheter in her right AC.  We will get this done this morning and she can likely go home this afternoon if it is normal. -TTE.   #Hypertension -Resume home BP meds.  BP stable this morning.  #Hypothyroidism -Continue Synthroid supplement.  FEN -No intravenous fluids -Diet: Heart healthy -DVT PPx  For questions or updates, please contact [place] Please consult [email or web address] for contact info under   Time Spent with Patient: I have spent a total of 35 minutes with patient reviewing hospital notes, telemetry, EKGs, labs and examining the patient as well as establishing an assessment and plan that was discussed with the patient.  > 50% of time was spent in direct patient care.    Signed, Addison Naegeli. Audie Box, MD, [place]  07/29/2021 7:53 AM

## 2021-07-29 NOTE — ED Notes (Signed)
Patient transported to CT 

## 2021-07-30 DIAGNOSIS — M25511 Pain in right shoulder: Secondary | ICD-10-CM | POA: Diagnosis not present

## 2021-07-30 DIAGNOSIS — M7541 Impingement syndrome of right shoulder: Secondary | ICD-10-CM | POA: Diagnosis not present

## 2021-08-02 DIAGNOSIS — S8261XA Displaced fracture of lateral malleolus of right fibula, initial encounter for closed fracture: Secondary | ICD-10-CM | POA: Diagnosis not present

## 2021-08-06 DIAGNOSIS — S82891A Other fracture of right lower leg, initial encounter for closed fracture: Secondary | ICD-10-CM | POA: Diagnosis not present

## 2021-08-06 DIAGNOSIS — M25571 Pain in right ankle and joints of right foot: Secondary | ICD-10-CM | POA: Diagnosis not present

## 2021-08-20 DIAGNOSIS — S82891S Other fracture of right lower leg, sequela: Secondary | ICD-10-CM | POA: Diagnosis not present

## 2021-08-25 NOTE — Progress Notes (Signed)
Cardiology Office Note:   Date:  08/26/2021  NAME:  Stephanie Daniel    MRN: [id number] DOB:  [birth year]   PCP:  Kelton Pillar, MD  Cardiologist:  Evalina Field, MD  Electrophysiologist:  None   Referring MD: Kelton Pillar, MD   Chief Complaint  Patient presents with   Coronary Artery Disease   History of Present Illness:   Stephanie Daniel is a 70 y.o. female with a hx of CAD and HLD who presents for follow-up. Seen in the hospital 07/29/2021 for acute chest pain. EKG normal and troponin negative. CTA performed that showed diffuse CAD that was non-obstructive by CT FFR. Medical management was pursued. She reports she is doing well.  No further chest pain episodes.  She reports she does not exercise due to bad back.  She will start to pursue an exercise bike.  Her blood pressure is 118/82.  Likely can stop hydrochlorothiazide.  We did increase her medications in the hospital.  She describes no episodes of dizziness or lightheadedness.  No syncope.  She does request 90-day refill of her medications.  Her LDL cholesterol is not at goal in the hospital.  Need to recheck this in the next 1 to 2 weeks.  She is not fasting today.  She overall seems to be doing quite well.  Denies any chest pain symptoms.  I did discuss with her that this can be managed medically.  Problem List CAD -Calcium score 2165 (99th percentile) -RCA 25-49% FFR CT 0.82 -mid LAD 70-99% FFR CT 0.87 -RI 50-69%  -LCX 25-49% FFR CT 0.79 2. HLD -T chol 205, HDL 75, LDL 114, TG 80 3. HTN  Past Medical History: Past Medical History:  Diagnosis Date   Arthritis    Coronary artery disease    Hypertension    Hypothyroidism    Thyroid disease     Past Surgical History: Past Surgical History:  Procedure Laterality Date   ABDOMINAL HYSTERECTOMY     APPENDECTOMY     KNEE SURGERY  11/18/2011   TOTAL KNEE ARTHROPLASTY Left 05/04/2017   Procedure: LEFT TOTAL KNEE ARTHROPLASTY;  Surgeon: Paralee Cancel, MD;  Location: [facility]  [facility];  Service: Orthopedics;  Laterality: Left;  90 mins    Current Medications: Current Meds  Medication Sig   acyclovir (ZOVIRAX) 400 MG tablet Take 400 mg by mouth 2 (two) times daily.   aspirin EC 81 MG tablet Take 1 tablet (81 mg total) by mouth daily. Swallow whole.   buPROPion (WELLBUTRIN XL) 150 MG 24 hr tablet Take 150 mg by mouth daily.   butalbital-acetaminophen-caffeine (FIORICET WITH CODEINE) 50-325-40-30 MG capsule Take 1 capsule by mouth every 6 (six) hours as needed. For migraine headaches   gabapentin (NEURONTIN) 300 MG capsule Take 300 mg by mouth at bedtime.   HYDROcodone-acetaminophen (NORCO/VICODIN) 5-325 MG tablet Take 1 tablet by mouth 2 (two) times daily as needed.   levothyroxine (SYNTHROID, LEVOTHROID) 50 MCG tablet Take 50 mcg by mouth daily before breakfast.   nitroGLYCERIN (NITROSTAT) 0.4 MG SL tablet Place 1 tablet (0.4 mg total) under the tongue every 5 (five) minutes x 3 doses as needed for chest pain.   omeprazole (PRILOSEC) 40 MG capsule Take 40 mg by mouth daily.   [DISCONTINUED] atorvastatin (LIPITOR) 80 MG tablet Take 80 mg by mouth daily.   [DISCONTINUED] hydrochlorothiazide (MICROZIDE) 12.5 MG capsule Take 2 capsules (25 mg total) by mouth daily.   [DISCONTINUED] lisinopril (ZESTRIL) 20 MG tablet Take 1 tablet (20 mg  total) by mouth daily.   [DISCONTINUED] metoprolol succinate (TOPROL XL) 25 MG 24 hr tablet Take 1 tablet (25 mg total) by mouth daily.     Allergies:    Albuterol   Social History: Social History   Socioeconomic History   Marital status: Divorced    Spouse name: Not on file   Number of children: Not on file   Years of education: Not on file   Highest education level: Not on file  Occupational History   Not on file  Tobacco Use   Smoking status: Former    Years: 20.00    Types: Cigarettes    Quit date: 04/28/1989    Years since quitting: 32.3   Smokeless tobacco: Never  Vaping Use   Vaping Use: Never used  Substance and  Sexual Activity   Alcohol use: No    Comment: Occas   Drug use: No   Sexual activity: Not on file  Other Topics Concern   Not on file  Social History Narrative   Not on file   Social Determinants of Health   Financial Resource Strain: Not on file  Food Insecurity: Not on file  Transportation Needs: Not on file  Physical Activity: Not on file  Stress: Not on file  Social Connections: Not on file     Family History: The patient's family history includes COPD in her brother and mother; Cancer in her father; Heart disease in her father and mother; Hypertension in her son.  ROS:   All other ROS reviewed and negative. Pertinent positives noted in the HPI.     EKGs/Labs/Other Studies Reviewed:   The following studies were personally reviewed by me today:  CTA 07/29/2021 IMPRESSION: 1. Coronary calcium score of 2165. This was 99th percentile for age-, race-, and sex-matched controls.   2. Normal coronary origin with right dominance.   3. Diffuse coronary atherosclerosis with mild plaque in the distal LM, RCA and LCX.   4. Severe (>70%) plaque in the LAD. However, this is heavily calcified and may be over estimated due to blooming artifact. Will send the study for FFRct.   5.  CAD-RADS 4.   6.  Atherosclerosis of the aorta.  TTE 07/29/2021  1. Left ventricular ejection fraction, by estimation, is 60 to 65%. Left  ventricular ejection fraction by 3D volume is 62 %. The left ventricle has  normal function. The left ventricle has no regional wall motion  abnormalities. Left ventricular diastolic   parameters are consistent with Grade I diastolic dysfunction (impaired  relaxation).   2. Right ventricular systolic function is low normal. The right  ventricular size is normal.   3. Anterior fat pad noted overlying the RV.   4. The mitral valve is grossly normal. Trivial mitral valve  regurgitation.   5. The aortic valve is tricuspid. Aortic valve regurgitation is not   visualized.   6. The inferior vena cava is normal in size with greater than 50%  respiratory variability, suggesting right atrial pressure of 3 mmHg.    Recent Labs: 07/28/2021: ALT 20; BUN 12; Creatinine, Ser 1.10; Hemoglobin 15.1; Magnesium 1.9; Platelets 254; Potassium 4.0; Sodium 137 07/29/2021: TSH 1.579   Recent Lipid Panel    Component Value Date/Time   CHOL 205 (H) 07/29/2021 0610   TRIG 80 07/29/2021 0610   HDL 75 07/29/2021 0610   CHOLHDL 2.7 07/29/2021 0610   VLDL 16 07/29/2021 0610   LDLCALC 114 (H) 07/29/2021 0610    Physical Exam:  VS:  BP 118/82 (BP Location: Left Arm, Patient Position: Sitting, Cuff Size: Normal)   Pulse 76   Ht 5\' 4"  (1.626 m)   Wt 176 lb (79.8 kg)   SpO2 96%   BMI 30.21 kg/m    Wt Readings from Last 3 Encounters:  08/26/21 176 lb (79.8 kg)  07/28/21 175 lb (79.4 kg)  05/04/17 176 lb (79.8 kg)    General: Well nourished, well developed, in no acute distress Head: Atraumatic, normal size  Eyes: PEERLA, EOMI  Neck: Supple, no JVD Endocrine: No thryomegaly Cardiac: Normal S1, S2; RRR; no murmurs, rubs, or gallops Lungs: Clear to auscultation bilaterally, no wheezing, rhonchi or rales  Abd: Soft, nontender, no hepatomegaly  Ext: No edema, pulses 2+ Musculoskeletal: No deformities, BUE and BLE strength normal and equal Skin: Warm and dry, no rashes   Neuro: Alert and oriented to person, place, time, and situation, CNII-XII grossly intact, no focal deficits  Psych: Normal mood and affect   ASSESSMENT:   Stephanie Daniel is a 70 y.o. female who presents for the following: 1. Coronary artery disease involving native coronary artery of native heart without angina pectoris   2. Agatston coronary artery calcium score greater than 400   3. Mixed hyperlipidemia   4. Primary hypertension     PLAN:   1. Coronary artery disease involving native coronary artery of native heart without angina pectoris 2. Agatston coronary artery calcium score  greater than 400 3. Mixed hyperlipidemia -Seen in the hospital with acute chest pain.  Underwent coronary CTA which showed very elevated coronary calcium score.  She did have concerns for a mid LAD lesion that was severe however this was negative by FFR CT.  She has no other significant areas of concern on CT scan or by FFR CT.  She has no dangerous blockages.  I did discuss with her this can continue to be managed medically.  She endorses no symptoms of angina.  For now we will just continue aspirin and statin therapy.  She will take 40 mg of Lipitor nightly.  We will recheck a fasting lipid profile as well as liver enzymes in the next 1 to 2 weeks when she is fasting.  Her blood pressure is very well controlled.  I did counsel her extensively on symptoms to look out for.  She has no red flag symptoms such as chest pain or trouble breathing.  I do want her to exercise more.  She will look into doing this in the next few weeks. -We will plan to see her back in 6 months.  As long as she remains symptom-free I think this can be managed medically.  4. HTN -Blood pressure very well controlled today.  Stop HCTZ.  Continue metoprolol succinate 25 mg daily, lisinopril 20 mg daily.  Disposition: Return in about 6 months (around 02/24/2022).  Medication Adjustments/Labs and Tests Ordered: Current medicines are reviewed at length with the patient today.  Concerns regarding medicines are outlined above.  Orders Placed This Encounter  Procedures   Hepatic function panel   Lipid panel    Meds ordered this encounter  Medications   lisinopril (ZESTRIL) 20 MG tablet    Sig: Take 1 tablet (20 mg total) by mouth daily.    Dispense:  90 tablet    Refill:  2   metoprolol succinate (TOPROL XL) 25 MG 24 hr tablet    Sig: Take 1 tablet (25 mg total) by mouth daily.    Dispense:  90 tablet    Refill:  2   atorvastatin (LIPITOR) 40 MG tablet    Sig: Take 1 tablet (40 mg total) by mouth daily.    Dispense:  90  tablet    Refill:  2     Patient Instructions  Medication Instructions:  STOP HCTZ The current medical regimen is effective;  continue present plan and medications.   *If you need a refill on your cardiac medications before your next appointment, please call your pharmacy*   Lab Work: LIPID/LIVER in 2 weeks (come back fasting, nothing to eat or drink, no lab appointment needed)  If you have labs (blood work) drawn today and your tests are completely normal, you will receive your results only by: [place] (if you have MyChart) OR A paper copy in the mail If you have any lab test that is abnormal or we need to change your treatment, we will call you to review the results.   Follow-Up: At [facility], you and your health needs are our priority.  As part of our continuing mission to provide you with exceptional heart care, we have created designated Provider Care Teams.  These Care Teams include your primary Cardiologist (physician) and Advanced Practice Providers (APPs -  Physician Assistants and Nurse Practitioners) who all work together to provide you with the care you need, when you need it.  We recommend signing up for the patient portal called "MyChart".  Sign up information is provided on this After Visit Summary.  MyChart is used to connect with patients for Virtual Visits (Telemedicine).  Patients are able to view lab/test results, encounter notes, upcoming appointments, etc.  Non-urgent messages can be sent to your provider as well.   To learn more about what you can do with MyChart, go to [email or web address].    Your next appointment:   6 month(s)  The format for your next appointment:   In Person  Provider:   Eleonore Chiquito, MD     Time Spent with Patient: I have spent a total of 35 minutes with patient reviewing hospital notes, telemetry, EKGs, labs and examining the patient as well as establishing an assessment and plan that was discussed with the  patient.  > 50% of time was spent in direct patient care.  Signed, Addison Naegeli. Audie Box, MD, [place]  [address] [place], [place] [address] [phone]  08/26/2021 4:15 PM

## 2021-08-26 ENCOUNTER — Ambulatory Visit (INDEPENDENT_AMBULATORY_CARE_PROVIDER_SITE_OTHER): Payer: Medicare Other | Admitting: Cardiovascular Disease

## 2021-08-26 ENCOUNTER — Encounter: Payer: Self-pay | Admitting: Cardiovascular Disease

## 2021-08-26 ENCOUNTER — Other Ambulatory Visit: Payer: Self-pay

## 2021-08-26 VITALS — BP 118/82 | HR 76 | Ht 64.0 in | Wt 176.0 lb

## 2021-08-26 DIAGNOSIS — R931 Abnormal findings on diagnostic imaging of heart and coronary circulation: Secondary | ICD-10-CM

## 2021-08-26 DIAGNOSIS — E782 Mixed hyperlipidemia: Secondary | ICD-10-CM | POA: Diagnosis not present

## 2021-08-26 DIAGNOSIS — I2510 Atherosclerotic heart disease of native coronary artery without angina pectoris: Secondary | ICD-10-CM

## 2021-08-26 DIAGNOSIS — I10 Essential (primary) hypertension: Secondary | ICD-10-CM

## 2021-08-26 MED ORDER — LISINOPRIL 20 MG PO TABS: 20.0000 mg | ORAL_TABLET | Freq: Every day | ORAL | 2 refills | Status: DC

## 2021-08-26 MED ORDER — ATORVASTATIN CALCIUM 40 MG PO TABS: 40.0000 mg | ORAL_TABLET | Freq: Every day | ORAL | 2 refills | Status: DC

## 2021-08-26 MED ORDER — METOPROLOL SUCCINATE ER 25 MG PO TB24: 25.0000 mg | ORAL_TABLET | Freq: Every day | ORAL | 2 refills | Status: DC

## 2021-08-26 NOTE — Patient Instructions (Addendum)
Medication Instructions:  STOP HCTZ The current medical regimen is effective;  continue present plan and medications.   *If you need a refill on your cardiac medications before your next appointment, please call your pharmacy*   Lab Work: LIPID/LIVER in 2 weeks (come back fasting, nothing to eat or drink, no lab appointment needed)  If you have labs (blood work) drawn today and your tests are completely normal, you will receive your results only by: [place] (if you have MyChart) OR A paper copy in the mail If you have any lab test that is abnormal or we need to change your treatment, we will call you to review the results.   Follow-Up: At [facility], you and your health needs are our priority.  As part of our continuing mission to provide you with exceptional heart care, we have created designated Provider Care Teams.  These Care Teams include your primary Cardiologist (physician) and Advanced Practice Providers (APPs -  Physician Assistants and Nurse Practitioners) who all work together to provide you with the care you need, when you need it.  We recommend signing up for the patient portal called "MyChart".  Sign up information is provided on this After Visit Summary.  MyChart is used to connect with patients for Virtual Visits (Telemedicine).  Patients are able to view lab/test results, encounter notes, upcoming appointments, etc.  Non-urgent messages can be sent to your provider as well.   To learn more about what you can do with MyChart, go to [email or web address].    Your next appointment:   6 month(s)  The format for your next appointment:   In Person  Provider:   Eleonore Chiquito, MD

## 2021-08-27 DIAGNOSIS — M25571 Pain in right ankle and joints of right foot: Secondary | ICD-10-CM | POA: Diagnosis not present

## 2021-09-04 DIAGNOSIS — Z23 Encounter for immunization: Secondary | ICD-10-CM | POA: Diagnosis not present

## 2021-09-10 DIAGNOSIS — E782 Mixed hyperlipidemia: Secondary | ICD-10-CM | POA: Diagnosis not present

## 2021-09-10 LAB — HEPATIC FUNCTION PANEL
ALT: 18 IU/L (ref 0–32)
AST: 19 IU/L (ref 0–40)
Albumin: 4.4 g/dL (ref 3.7–4.7)
Alkaline Phosphatase: 99 IU/L (ref 44–121)
Bilirubin Total: 0.4 mg/dL (ref 0.0–1.2)
Bilirubin, Direct: 0.13 mg/dL (ref 0.00–0.40)
Total Protein: 6.4 g/dL (ref 6.0–8.5)

## 2021-09-10 LAB — LIPID PANEL
Chol/HDL Ratio: 2.1 ratio (ref 0.0–4.4)
Cholesterol, Total: 161 mg/dL (ref 100–199)
HDL: 78 mg/dL (ref >39)
LDL Chol Calc (NIH): 68 mg/dL (ref 0–99)
Triglycerides: 77 mg/dL (ref 0–149)
VLDL Cholesterol Cal: 15 mg/dL (ref 5–40)

## 2021-09-17 DIAGNOSIS — S82891S Other fracture of right lower leg, sequela: Secondary | ICD-10-CM | POA: Diagnosis not present

## 2021-09-26 DIAGNOSIS — M47816 Spondylosis without myelopathy or radiculopathy, lumbar region: Secondary | ICD-10-CM | POA: Diagnosis not present

## 2021-10-15 DIAGNOSIS — M25511 Pain in right shoulder: Secondary | ICD-10-CM | POA: Diagnosis not present

## 2021-10-23 DIAGNOSIS — J209 Acute bronchitis, unspecified: Secondary | ICD-10-CM | POA: Diagnosis not present

## 2021-10-23 DIAGNOSIS — J019 Acute sinusitis, unspecified: Secondary | ICD-10-CM | POA: Diagnosis not present

## 2021-10-29 ENCOUNTER — Other Ambulatory Visit: Payer: Self-pay | Admitting: Home Health

## 2021-10-29 ENCOUNTER — Telehealth: Payer: Self-pay | Admitting: Cardiovascular Disease

## 2021-10-29 NOTE — Telephone Encounter (Signed)
Pt c/o medication issue:  1. Name of Medication: Metoprolol, Atorvastatin  2. How are you currently taking this medication (dosage and times per day)? Metoprolol 1 time a day and 40 mg of Atorvastatin at night  3. Are you having a reaction (difficulty breathing--STAT)?   4. What is your medication issue? Lightheaded- not sure if one of these medicine might be causing it

## 2021-10-29 NOTE — Telephone Encounter (Signed)
Hold metoprolol. See how she feels. Start checking BP daily.     Lake Bells T. Wake, MD, [place]   [address], [place]  [place], [place] [address]  [phone]   4:46 PM   Patient reports her BP was 140/85 and HR 52 Advised to HOLD metoprolol succinate, check BP/pulse daily, send readings and update on symptoms via MyChart or call in 1 week

## 2021-10-29 NOTE — Telephone Encounter (Signed)
Returned call to patient of Dr. Audie Box -- she does not routinely check BP and pulse. She has no readings to provide. This is a daily occurrence and has been going on for 3 weeks, worsening.   Advised that without knowing vitals, would be difficult to determine if meds need to be adjusted if readings are low, causing symptoms.   Will send to MD to review  Advised she check BP/pulse at least once daily and if worsening episodes, to log these also

## 2021-11-04 DIAGNOSIS — J019 Acute sinusitis, unspecified: Secondary | ICD-10-CM | POA: Diagnosis not present

## 2021-11-04 DIAGNOSIS — J209 Acute bronchitis, unspecified: Secondary | ICD-10-CM | POA: Diagnosis not present

## 2021-11-06 ENCOUNTER — Telehealth: Payer: Self-pay | Admitting: Cardiovascular Disease

## 2021-11-06 NOTE — Telephone Encounter (Signed)
Called patient, she states that she feels much better since being off the Metoprolol.  She did have BP readings for the last week as requested.   12/13: 140/75 HR 55 12/14: 120/74 HR 62 12/15: 132/77 HR 72 12/16: 139/88 HR 86 12/17: 136/80 HR 77 12/18: 125/75 HR 80 12/19: NA 12/20: 135/75 HR 69  Patient questioned if she should continue to stay off of Metoprolol, I did advise to stay off medication until told to restart by MD.  Patient aware I will call back if any changes.  Patient advised to continue to monitor BP/HR and call back with any concerns.  Routing to MD for FYI.  Thanks!

## 2021-11-06 NOTE — Telephone Encounter (Signed)
Follow Up:     Patient said Dr Audie Box stopped her Metoprolol last week > He told her to let him know this week , how she felt after a week not taking the medicine. She says she feels better and have not had any dizziness.

## 2021-11-07 NOTE — Telephone Encounter (Signed)
Attempted to contact to review response from MD. Patient line was busy.

## 2021-11-14 NOTE — Telephone Encounter (Signed)
Called patient, advised that she was feeling great.  She is not having chest pains or any symptoms. She is aware to contact us if she has any issues.   Patient thankful for call back.   Routing to MD as Juluis Rainier.  Thanks!

## 2021-12-09 DIAGNOSIS — M25511 Pain in right shoulder: Secondary | ICD-10-CM | POA: Diagnosis not present

## 2021-12-09 DIAGNOSIS — S83281D Other tear of lateral meniscus, current injury, right knee, subsequent encounter: Secondary | ICD-10-CM | POA: Diagnosis not present

## 2021-12-09 DIAGNOSIS — M25561 Pain in right knee: Secondary | ICD-10-CM | POA: Diagnosis not present

## 2021-12-23 DIAGNOSIS — N390 Urinary tract infection, site not specified: Secondary | ICD-10-CM | POA: Diagnosis not present

## 2022-01-02 DIAGNOSIS — M5136 Other intervertebral disc degeneration, lumbar region: Secondary | ICD-10-CM | POA: Diagnosis not present

## 2022-01-02 DIAGNOSIS — Z79899 Other long term (current) drug therapy: Secondary | ICD-10-CM | POA: Diagnosis not present

## 2022-01-02 DIAGNOSIS — Z5181 Encounter for therapeutic drug level monitoring: Secondary | ICD-10-CM | POA: Diagnosis not present

## 2022-02-10 DIAGNOSIS — N1832 Chronic kidney disease, stage 3b: Secondary | ICD-10-CM | POA: Diagnosis not present

## 2022-02-10 DIAGNOSIS — R7303 Prediabetes: Secondary | ICD-10-CM | POA: Diagnosis not present

## 2022-02-10 DIAGNOSIS — I129 Hypertensive chronic kidney disease with stage 1 through stage 4 chronic kidney disease, or unspecified chronic kidney disease: Secondary | ICD-10-CM | POA: Diagnosis not present

## 2022-02-10 DIAGNOSIS — G43909 Migraine, unspecified, not intractable, without status migrainosus: Secondary | ICD-10-CM | POA: Diagnosis not present

## 2022-02-10 DIAGNOSIS — I2510 Atherosclerotic heart disease of native coronary artery without angina pectoris: Secondary | ICD-10-CM | POA: Diagnosis not present

## 2022-02-10 DIAGNOSIS — Z1389 Encounter for screening for other disorder: Secondary | ICD-10-CM | POA: Diagnosis not present

## 2022-02-10 DIAGNOSIS — Z0000 Encounter for general adult medical examination without abnormal findings: Secondary | ICD-10-CM | POA: Diagnosis not present

## 2022-02-10 DIAGNOSIS — E049 Nontoxic goiter, unspecified: Secondary | ICD-10-CM | POA: Diagnosis not present

## 2022-02-10 DIAGNOSIS — E039 Hypothyroidism, unspecified: Secondary | ICD-10-CM | POA: Diagnosis not present

## 2022-02-10 DIAGNOSIS — J45909 Unspecified asthma, uncomplicated: Secondary | ICD-10-CM | POA: Diagnosis not present

## 2022-02-10 DIAGNOSIS — K219 Gastro-esophageal reflux disease without esophagitis: Secondary | ICD-10-CM | POA: Diagnosis not present

## 2022-02-10 DIAGNOSIS — F39 Unspecified mood [affective] disorder: Secondary | ICD-10-CM | POA: Diagnosis not present

## 2022-02-17 DIAGNOSIS — H524 Presbyopia: Secondary | ICD-10-CM | POA: Diagnosis not present

## 2022-02-17 DIAGNOSIS — H5201 Hypermetropia, right eye: Secondary | ICD-10-CM | POA: Diagnosis not present

## 2022-02-17 DIAGNOSIS — H5212 Myopia, left eye: Secondary | ICD-10-CM | POA: Diagnosis not present

## 2022-02-17 DIAGNOSIS — H52223 Regular astigmatism, bilateral: Secondary | ICD-10-CM | POA: Diagnosis not present

## 2022-02-17 DIAGNOSIS — H2513 Age-related nuclear cataract, bilateral: Secondary | ICD-10-CM | POA: Diagnosis not present

## 2022-02-20 DIAGNOSIS — S83281D Other tear of lateral meniscus, current injury, right knee, subsequent encounter: Secondary | ICD-10-CM | POA: Diagnosis not present

## 2022-02-20 DIAGNOSIS — M25561 Pain in right knee: Secondary | ICD-10-CM | POA: Diagnosis not present

## 2022-02-20 DIAGNOSIS — M25511 Pain in right shoulder: Secondary | ICD-10-CM | POA: Diagnosis not present

## 2022-03-28 ENCOUNTER — Telehealth: Payer: Self-pay | Admitting: Cardiovascular Disease

## 2022-03-28 NOTE — Telephone Encounter (Signed)
Spoke to patient she stated she has had 3 episodes of chest heaviness,fast heart beat.No symptoms at present.Appointment scheduled with Coletta Memos NP 5/16 at 2:45 pm.Advised to go to ED if she has any more chest heaviness.Dr.O'Neal is out of office this afternoon.I will make Dr.O'Neal aware. ?

## 2022-03-28 NOTE — Telephone Encounter (Signed)
Patient c/o Palpitations:  High priority if patient c/o lightheadedness, shortness of breath, or chest pain ? ?How long have you had palpitations/irregular HR/ Afib? Are you having the symptoms now?  ? ?Patient states over the past 6 weeks she has had 3 episodes of severe palpitations. She states during the episodes she also experiences chest tightness and symptoms last for about 30-45 minutes. Last episode was last night. She states she took a nitro and sensation was relieved. No symptoms currently. ? ?Are you currently experiencing lightheadedness, SOB or CP?  ?No symptoms currently ? ?Do you have a history of afib (atrial fibrillation) or irregular heart rhythm?  ?No  ? ?Have you checked your BP or HR? (document readings if available):  ?Patient states BP is fine, HR has gotten up to 105 ? ?Are you experiencing any other symptoms?  ?Chest tightness occurred during episode of palpitations last night. Denies CP.  ? ?

## 2022-03-31 NOTE — Progress Notes (Signed)
? ?Cardiology Clinic Note  ? ?Patient Name: Stephanie Daniel ?Date of Encounter: 04/01/2022 ? ?Primary Care Provider:  Kelton Pillar, MD ?Primary Cardiologist:  Evalina Field, MD ? ?Patient Profile  ?  ?Stephanie Daniel 71 year old female presents to the clinic today for evaluation of her coronary artery disease and chest discomfort. ? ?Past Medical History  ?  ?Past Medical History:  ?Diagnosis Date  ? Arthritis   ? Coronary artery disease   ? Hypertension   ? Hypothyroidism   ? Thyroid disease   ? ?Past Surgical History:  ?Procedure Laterality Date  ? ABDOMINAL HYSTERECTOMY    ? APPENDECTOMY    ? KNEE SURGERY  11/18/2011  ? TOTAL KNEE ARTHROPLASTY Left 05/04/2017  ? Procedure: LEFT TOTAL KNEE ARTHROPLASTY;  Surgeon: Paralee Cancel, MD;  Location: [facility];  Service: Orthopedics;  Laterality: Left;  90 mins  ? ? ?Allergies ? ?Allergies  ?Allergen Reactions  ? Albuterol Other (See Comments)  ?  Respiratory distress  ? ? ?History of Present Illness  ?  ?Stephanie Daniel has a PMH of hypothyroidism, status post left TKA, obesity, coronary artery disease, and essential hypertension.  She developed acute chest pain 07/29/2021.  Her troponins were negative and her EKG was normal.  Coronary CTA was performed and showed diffuse CAD with nonobstructive disease by FFR.  Medical management was recommended. ? ?She was seen by Dr. Audie Box 08/26/2021.  During that time she denied further episodes of chest discomfort.  She reported she was doing well.  She was unable to exercise due to back pain.  She was encouraged to start using a exercise bike.  Her blood pressure was 118/82.  Her hydrochlorothiazide was discontinued.  Her lipid panel 09/10/2021 showed an LDL of 68. ? ?She contacted the nurse triage line on 03/28/2022.  She reported 3 episodes of chest heaviness and fast heartbeat.  At the time of the call she had no symptoms.  It was felt that she may need left heart cath due to her symptoms. ? ?She presents to the clinic today for  evaluation and states she has had 3 episodes of dizziness, increased heart rate, headache, clamminess.  She presented with her granddaughter.  The first episode was 5 to 6 weeks ago.  She had a repeat episode 3 weeks ago and then another episode about 1 week ago.  All of these episodes were at rest.  She reports that she took an aspirin and sublingual nitro and did not receive much relief.  The episodes resolved after about 30-45 minutes.  She notes 1 episode was in the presence of a single beer.  We reviewed her coronary CTA and FFR.  She expressed understanding.  I will order a CBC, BMP, TSH, free T4, 14-day cardiac event monitor, have her increase her p.o. hydration, and plan follow-up for 6 to 8 weeks.  We reviewed her EKG which shows normal sinus rhythm 91 bpm.  Blood pressure today is 134/82. ? ?Today she denies chest pain, shortness of breath, lower extremity edema, fatigue, palpitations, melena, hematuria, hemoptysis, diaphoresis, weakness, presyncope, syncope, orthopnea, and PND. ? ? ?Home Medications  ?  ?Prior to Admission medications   ?Medication Sig Start Date End Date Taking? Authorizing Provider  ?acyclovir (ZOVIRAX) 400 MG tablet Take 400 mg by mouth 2 (two) times daily. 02/23/17   [provider]  ?aspirin EC 81 MG tablet Take 1 tablet (81 mg total) by mouth daily. Swallow whole. 07/29/21 07/29/22  Margie Billet, NP  ?  atorvastatin (LIPITOR) 40 MG tablet Take 1 tablet (40 mg total) by mouth daily. 08/26/21   O'NealCassie Freer, MD  ?buPROPion (WELLBUTRIN XL) 150 MG 24 hr tablet Take 150 mg by mouth daily.    [provider]  ?butalbital-acetaminophen-caffeine (FIORICET WITH CODEINE) 50-325-40-30 MG capsule Take 1 capsule by mouth every 6 (six) hours as needed. For migraine headaches 02/11/17   [provider]  ?gabapentin (NEURONTIN) 300 MG capsule Take 300 mg by mouth at bedtime.    [provider]  ?HYDROcodone-acetaminophen (NORCO/VICODIN) 5-325 MG tablet Take 1  tablet by mouth 2 (two) times daily as needed. 08/08/21   [provider]  ?levothyroxine (SYNTHROID, LEVOTHROID) 50 MCG tablet Take 50 mcg by mouth daily before breakfast. 02/17/17   [provider]  ?lisinopril (ZESTRIL) 20 MG tablet Take 1 tablet (20 mg total) by mouth daily. 08/26/21   O'NealCassie Freer, MD  ?metoprolol succinate (TOPROL XL) 25 MG 24 hr tablet Take 1 tablet (25 mg total) by mouth daily. 08/26/21 08/26/22  O'NealCassie Freer, MD  ?nitroGLYCERIN (NITROSTAT) 0.4 MG SL tablet Place 1 tablet (0.4 mg total) under the tongue every 5 (five) minutes x 3 doses as needed for chest pain. 07/29/21   Margie Billet, NP  ?omeprazole (PRILOSEC) 40 MG capsule Take 40 mg by mouth daily.    [provider]  ? ? ?Family History  ?  ?Family History  ?Problem Relation Age of Onset  ? COPD Mother   ? Heart disease Mother   ? Heart disease Father   ? Cancer Father   ? COPD Brother   ? Hypertension Son   ? ?She indicated that her mother is deceased. She indicated that her father is deceased. She indicated that her sister is alive. She indicated that her brother is deceased. She indicated that her maternal grandmother is deceased. She indicated that her maternal grandfather is deceased. She indicated that her paternal grandmother is deceased. She indicated that her paternal grandfather is deceased. She indicated that her son is alive. She indicated that her maternal aunt is alive. She indicated that her maternal uncle is alive. She indicated that only one of her three paternal aunts is alive. She indicated that her paternal uncle is alive. ? ?Social History  ?  ?Social History  ? ?Socioeconomic History  ? Marital status: Divorced  ?  Spouse name: Not on file  ? Number of children: Not on file  ? Years of education: Not on file  ? Highest education level: Not on file  ?Occupational History  ? Not on file  ?Tobacco Use  ? Smoking status: Former  ?  Years: 20.00  ?  Types: Cigarettes  ?  Quit date:  04/28/1989  ?  Years since quitting: 32.9  ? Smokeless tobacco: Never  ?Vaping Use  ? Vaping Use: Never used  ?Substance and Sexual Activity  ? Alcohol use: No  ?  Comment: Occas  ? Drug use: No  ? Sexual activity: Not on file  ?Other Topics Concern  ? Not on file  ?Social History Narrative  ? Not on file  ? ?Social Determinants of Health  ? ?Financial Resource Strain: Not on file  ?Food Insecurity: Not on file  ?Transportation Needs: Not on file  ?Physical Activity: Not on file  ?Stress: Not on file  ?Social Connections: Not on file  ?Intimate Partner Violence: Not on file  ?  ? ?Review of Systems  ?  ?General:  No chills, fever,  night sweats or weight changes.  ?Cardiovascular:  No chest pain, dyspnea on exertion, edema, orthopnea, palpitations, paroxysmal nocturnal dyspnea. ?Dermatological: No rash, lesions/masses ?Respiratory: No cough, dyspnea ?Urologic: No hematuria, dysuria ?Abdominal:   No nausea, vomiting, diarrhea, bright red blood per rectum, melena, or hematemesis ?Neurologic:  No visual changes, wkns, changes in mental status. ?All other systems reviewed and are otherwise negative except as noted above. ? ?Physical Exam  ?  ?VS:  BP 134/82   Pulse 91   Ht '5\' 4"'$  (1.626 m)   Wt 169 lb (76.7 kg)   SpO2 96%   BMI 29.01 kg/m?  , BMI Body mass index is 29.01 kg/m?. ?GEN: Well nourished, well developed, in no acute distress. ?HEENT: normal. ?Neck: Supple, no JVD, carotid bruits, or masses. ?Cardiac: RRR, no murmurs, rubs, or gallops. No clubbing, cyanosis, edema.  Radials/DP/PT 2+ and equal bilaterally.  ?Respiratory:  Respirations regular and unlabored, clear to auscultation bilaterally. ?GI: Soft, nontender, nondistended, BS + x 4. ?MS: no deformity or atrophy. ?Skin: warm and dry, no rash. ?Neuro:  Strength and sensation are intact. ?Psych: Normal affect. ? ?Accessory Clinical Findings  ?  ?Recent Labs: ?07/28/2021: BUN 12; Creatinine, Ser 1.10; Hemoglobin 15.1; Magnesium 1.9; Platelets 254; Potassium  4.0; Sodium 137 ?07/29/2021: TSH 1.579 ?09/10/2021: ALT 18  ? ?Recent Lipid Panel ?   ?Component Value Date/Time  ? CHOL 161 09/10/2021 1018  ? TRIG 77 09/10/2021 1018  ? HDL 78 09/10/2021 1018  ? CHOLHDL 2.1 10

## 2022-04-01 ENCOUNTER — Ambulatory Visit (INDEPENDENT_AMBULATORY_CARE_PROVIDER_SITE_OTHER): Payer: Medicare Other | Admitting: General Practice

## 2022-04-01 ENCOUNTER — Ambulatory Visit (INDEPENDENT_AMBULATORY_CARE_PROVIDER_SITE_OTHER): Payer: Medicare Other

## 2022-04-01 ENCOUNTER — Encounter: Payer: Self-pay | Admitting: General Practice

## 2022-04-01 VITALS — BP 134/82 | HR 91 | Ht 64.0 in | Wt 169.0 lb

## 2022-04-01 DIAGNOSIS — R42 Dizziness and giddiness: Secondary | ICD-10-CM | POA: Diagnosis not present

## 2022-04-01 DIAGNOSIS — I10 Essential (primary) hypertension: Secondary | ICD-10-CM | POA: Diagnosis not present

## 2022-04-01 DIAGNOSIS — E782 Mixed hyperlipidemia: Secondary | ICD-10-CM | POA: Diagnosis not present

## 2022-04-01 DIAGNOSIS — M1711 Unilateral primary osteoarthritis, right knee: Secondary | ICD-10-CM | POA: Diagnosis not present

## 2022-04-01 DIAGNOSIS — R009 Unspecified abnormalities of heart beat: Secondary | ICD-10-CM | POA: Diagnosis not present

## 2022-04-01 DIAGNOSIS — R002 Palpitations: Secondary | ICD-10-CM

## 2022-04-01 DIAGNOSIS — M25561 Pain in right knee: Secondary | ICD-10-CM | POA: Diagnosis not present

## 2022-04-01 DIAGNOSIS — I2510 Atherosclerotic heart disease of native coronary artery without angina pectoris: Secondary | ICD-10-CM | POA: Diagnosis not present

## 2022-04-01 NOTE — Patient Instructions (Addendum)
Medication Instructions:  ?The current medical regimen is effective;  continue present plan and medications as directed. Please refer to the Current Medication list given to you today.  ? ?*If you need a refill on your cardiac medications before your next appointment, please call your pharmacy* ? ?Lab Work:    ?CBC,BMET,TSH, FREE T4 ? ?If you have labs (blood work) drawn today and your tests are completely normal, you will receive your results only by: ?MyChart Message (if you have MyChart) OR  A paper copy in the mail ?If you have any lab test that is abnormal or we need to change your treatment, we will call you to review the results. ? ?Testing/Procedures:  ?Your physician has requested that you have a Zio Monitor-see attached ? ?Special Instructions ?PLEASE READ AND FOLLOW SALTY 6-ATTACHED-1,'800mg'$  daily ? ?Follow-Up: ?Your next appointment:  6-8 week(s) In Person with Evalina Field, MD OR Coletta Memos, FNP-C   ? ?At [facility], you and your health needs are our priority.  As part of our continuing mission to provide you with exceptional heart care, we have created designated Provider Care Teams.  These Care Teams include your primary Cardiologist (physician) and Advanced Practice Providers (APPs -  Physician Assistants and Nurse Practitioners) who all work together to provide you with the care you need, when you need it. ? ? ?Important Information About Sugar ? ? ? ? ? ? ?        6 SALTY THINGS TO AVOID     1,'800MG'$  DAILY ? ? ? ? ?ZIO XT- Long Term Monitor Instructions ? ?Your physician has requested you wear a ZIO patch monitor for 14 days.  ?This is a single patch monitor. [employer] supplies one patch monitor per enrollment. Additional ?stickers are not available. Please do not apply patch if you will be having a Nuclear Stress Test,  ?Echocardiogram, Cardiac CT, MRI, or Chest Xray during the period you would be wearing the  ?monitor. The patch cannot be worn during these tests. You cannot remove and  re-apply the  ?ZIO XT patch monitor.  ?Your ZIO patch monitor will be mailed 3 day USPS to your address on file. It may take 3-5 days  ?to receive your monitor after you have been enrolled.  ?Once you have received your monitor, please review the enclosed instructions. Your monitor  ?has already been registered assigning a specific monitor serial # to you. ? ?Billing and Patient Assistance Program Information ? ?We have supplied [employer] with any of your insurance information on file for billing purposes. ?[employer] offers a sliding scale Patient Assistance Program for patients that do not have  ?insurance, or whose insurance does not completely cover the cost of the ZIO monitor.  ?You must apply for the Patient Assistance Program to qualify for this discounted rate.  ?To apply, please call [employer] at [phone], select option 4, select option 2, ask to apply for  ?Patient Assistance Program. Theodore Demark will ask your household income, and how many people  ?are in your household. They will quote your out-of-pocket cost based on that information.  ?[employer] will also be able to set up a 68-month interest-free payment plan if needed. ? ?Applying the monitor ?  ?Shave hair from upper left chest.  ?Hold abrader disc by orange tab. Rub abrader in 40 strokes over the upper left chest as  ?indicated in your monitor instructions.  ?Clean area with 4 enclosed alcohol pads. Let dry.  ?Apply patch as indicated in monitor instructions. Patch  will be placed under collarbone on left  ?side of chest with arrow pointing upward.  ?Rub patch adhesive wings for 2 minutes. Remove white label marked "1". Remove the white  ?label marked "2". Rub patch adhesive wings for 2 additional minutes.  ?While looking in a mirror, press and release button in center of patch. A small green light will  ?flash 3-4 times. This will be your only indicator that the monitor has been turned on.  ?Do not shower for the first 24 hours. You may shower after the  first 24 hours.  ?Press the button if you feel a symptom. You will hear a small click. Record Date, Time and  ?Symptom in the Patient Logbook.  ?When you are ready to remove the patch, follow instructions on the last 2 pages of Patient  ?Logbook. Stick patch monitor onto the last page of Patient Logbook.  ?Place Patient Logbook in the blue and white box. Use locking tab on box and tape box closed  ?securely. The blue and white box has prepaid postage on it. Please place it in the mailbox as  ?soon as possible. Your physician should have your test results approximately 7 days after the  ?monitor has been mailed back to [facility].  ?Call [facility] at [phone] if you have questions regarding  ?your ZIO XT patch monitor. Call them immediately if you see an orange light blinking on your  ?monitor.  ?If your monitor falls off in less than 4 days, contact our Monitor department at [phone].  ?If your monitor becomes loose or falls off after 4 days call [employer] at [phone] for  ?suggestions on securing your monitor ? ? ?

## 2022-04-01 NOTE — Progress Notes (Unsigned)
Enrolled for [employer] to mail a ZIO XT long term holter monitor to the patients address on file.   Dr. O'Neal to read.  

## 2022-04-02 LAB — CBC
Hematocrit: 37.1 % (ref 34.0–46.6)
Hemoglobin: 13.2 g/dL (ref 11.1–15.9)
MCH: 34.1 pg — ABNORMAL HIGH (ref 26.6–33.0)
MCHC: 35.6 g/dL (ref 31.5–35.7)
MCV: 96 fL (ref 79–97)
Platelets: 300 10*3/uL (ref 150–450)
RBC: 3.87 x10E6/uL (ref 3.77–5.28)
RDW: 13.5 % (ref 11.7–15.4)
WBC: 7.6 10*3/uL (ref 3.4–10.8)

## 2022-04-02 LAB — BASIC METABOLIC PANEL
BUN/Creatinine Ratio: 12 (ref 12–28)
BUN: 15 mg/dL (ref 8–27)
CO2: 23 mmol/L (ref 20–29)
Calcium: 9.9 mg/dL (ref 8.7–10.3)
Chloride: 99 mmol/L (ref 96–106)
Creatinine, Ser: 1.22 mg/dL — ABNORMAL HIGH (ref 0.57–1.00)
Glucose: 123 mg/dL — ABNORMAL HIGH (ref 70–99)
Potassium: 5.1 mmol/L (ref 3.5–5.2)
Sodium: 138 mmol/L (ref 134–144)
eGFR: 47 mL/min/{1.73_m2} — ABNORMAL LOW (ref >59)

## 2022-04-02 LAB — T4, FREE: Free T4: 1.45 ng/dL (ref 0.82–1.77)

## 2022-04-02 LAB — TSH: TSH: 0.346 u[IU]/mL — ABNORMAL LOW (ref 0.450–4.500)

## 2022-04-02 NOTE — Telephone Encounter (Signed)
Patient seen in office by JC.  ?Appointment scheduled to see Dr.O'Neal 06/27 ?

## 2022-04-03 DIAGNOSIS — I10 Essential (primary) hypertension: Secondary | ICD-10-CM

## 2022-04-03 DIAGNOSIS — R009 Unspecified abnormalities of heart beat: Secondary | ICD-10-CM

## 2022-04-03 DIAGNOSIS — R002 Palpitations: Secondary | ICD-10-CM | POA: Diagnosis not present

## 2022-04-03 DIAGNOSIS — R42 Dizziness and giddiness: Secondary | ICD-10-CM

## 2022-04-15 DIAGNOSIS — M25561 Pain in right knee: Secondary | ICD-10-CM | POA: Diagnosis not present

## 2022-04-25 DIAGNOSIS — M25561 Pain in right knee: Secondary | ICD-10-CM | POA: Diagnosis not present

## 2022-04-29 ENCOUNTER — Telehealth: Payer: Self-pay | Admitting: Cardiovascular Disease

## 2022-04-29 DIAGNOSIS — R009 Unspecified abnormalities of heart beat: Secondary | ICD-10-CM | POA: Diagnosis not present

## 2022-04-29 DIAGNOSIS — R42 Dizziness and giddiness: Secondary | ICD-10-CM | POA: Diagnosis not present

## 2022-04-29 DIAGNOSIS — R002 Palpitations: Secondary | ICD-10-CM | POA: Diagnosis not present

## 2022-04-29 DIAGNOSIS — I10 Essential (primary) hypertension: Secondary | ICD-10-CM | POA: Diagnosis not present

## 2022-04-29 NOTE — Telephone Encounter (Signed)
This is a Scientist, forensic final report. Discuss with Dr Harriet Masson. Denyse Amass will be in tomorrow OK to wait until his return.

## 2022-04-29 NOTE — Telephone Encounter (Signed)
Tanzania from [facility] is calling to report an abormal cardiac report

## 2022-05-01 DIAGNOSIS — M47816 Spondylosis without myelopathy or radiculopathy, lumbar region: Secondary | ICD-10-CM | POA: Diagnosis not present

## 2022-05-01 DIAGNOSIS — M5136 Other intervertebral disc degeneration, lumbar region: Secondary | ICD-10-CM | POA: Diagnosis not present

## 2022-05-01 DIAGNOSIS — M545 Low back pain, unspecified: Secondary | ICD-10-CM | POA: Diagnosis not present

## 2022-05-06 DIAGNOSIS — M13861 Other specified arthritis, right knee: Secondary | ICD-10-CM | POA: Diagnosis not present

## 2022-05-06 DIAGNOSIS — M7541 Impingement syndrome of right shoulder: Secondary | ICD-10-CM | POA: Diagnosis not present

## 2022-05-12 NOTE — Progress Notes (Signed)
Cardiology Office Note:   Date:  05/14/2022  NAME:  Stephanie Daniel    MRN: [id number] DOB:  15-Sep-1951   PCP:  Kelton Pillar, MD  Cardiologist:  Evalina Field, MD  Electrophysiologist:  None   Referring MD: Kelton Pillar, MD   Chief Complaint  Patient presents with   Follow-up   History of Present Illness:   Stephanie Daniel is a 71 y.o. female with a hx of CAD, HTN, HLD who presents for follow-up of SVT.  We discussed her monitor in the office.  This does show a long episode of SVT.  Looks to be AVNRT per my review.  She reports chest pressure when she has this rapid heartbeat sensation.  She reports otherwise she is not having any chest pain or pressure.  Symptoms are occurring every 2 to 3 weeks.  They can last between 30 minutes to 1.5 hours.  Again it appears the rapid heart rate is what drives this.  Her metoprolol was stopped due to fatigue.  We discussed restarting this as needed.  Recent echo normal.  Recent CT FFR has been unremarkable.  Given her ongoing symptoms would like to check a stress test to make sure perfusion is normal.  Cholesterol level at goal.  Blood pressure well controlled in office.  She will undergo right knee surgery in the next 3 weeks.  I believe this is more prudent to obtain her stress test.  We also discussed referral to EP for her SVT.  Problem List CAD -Calcium score 2165 (99th percentile) -RCA 25-49% FFR CT 0.82 -mid LAD 70-99% FFR CT 0.87 -RI 50-69%  -LCX 25-49% FFR CT 0.79 2. HLD -T chol 178, HDL 92, LDL 69, TG 94 3. HTN 4. SVT  Past Medical History: Past Medical History:  Diagnosis Date   Arthritis    Coronary artery disease    Hypertension    Hypothyroidism    Thyroid disease     Past Surgical History: Past Surgical History:  Procedure Laterality Date   ABDOMINAL HYSTERECTOMY     APPENDECTOMY     KNEE SURGERY  11/18/2011   TOTAL KNEE ARTHROPLASTY Left 05/04/2017   Procedure: LEFT TOTAL KNEE ARTHROPLASTY;  Surgeon: Paralee Cancel, MD;  Location: [facility];  Service: Orthopedics;  Laterality: Left;  90 mins    Current Medications: Current Meds  Medication Sig   acyclovir (ZOVIRAX) 400 MG tablet Take 400 mg by mouth 2 (two) times daily.   aspirin EC 81 MG tablet Take 1 tablet (81 mg total) by mouth daily. Swallow whole.   atorvastatin (LIPITOR) 40 MG tablet Take 1 tablet (40 mg total) by mouth daily.   buPROPion (WELLBUTRIN XL) 150 MG 24 hr tablet Take 150 mg by mouth daily.   gabapentin (NEURONTIN) 300 MG capsule Take 300 mg by mouth at bedtime.   hydrochlorothiazide (MICROZIDE) 12.5 MG capsule Take 1 tablet by mouth daily.   HYDROcodone-acetaminophen (NORCO/VICODIN) 5-325 MG tablet Take 1 tablet by mouth 2 (two) times daily as needed.   levothyroxine (SYNTHROID, LEVOTHROID) 50 MCG tablet Take 50 mcg by mouth daily before breakfast.   lisinopril (ZESTRIL) 20 MG tablet Take 1 tablet (20 mg total) by mouth daily.   meloxicam (MOBIC) 15 MG tablet Take 15 mg by mouth 2 (two) times a week.   metoprolol tartrate (LOPRESSOR) 25 MG tablet Take 1 tablet (25 mg total) by mouth daily as needed (for heart rate over 100 bpm).   nitroGLYCERIN (NITROSTAT) 0.4 MG SL tablet  Place 1 tablet (0.4 mg total) under the tongue every 5 (five) minutes x 3 doses as needed for chest pain.   omeprazole (PRILOSEC) 40 MG capsule Take 40 mg by mouth daily.     Allergies:    Albuterol   Social History: Social History   Socioeconomic History   Marital status: Divorced    Spouse name: Not on file   Number of children: Not on file   Years of education: Not on file   Highest education level: Not on file  Occupational History   Not on file  Tobacco Use   Smoking status: Former    Years: 20.00    Types: Cigarettes    Quit date: 04/28/1989    Years since quitting: 33.0   Smokeless tobacco: Never  Vaping Use   Vaping Use: Never used  Substance and Sexual Activity   Alcohol use: No    Comment: Occas   Drug use: No   Sexual activity:  Not on file  Other Topics Concern   Not on file  Social History Narrative   Not on file   Social Determinants of Health   Financial Resource Strain: Not on file  Food Insecurity: Not on file  Transportation Needs: Not on file  Physical Activity: Not on file  Stress: Not on file  Social Connections: Not on file     Family History: The patient's family history includes COPD in her brother and mother; Cancer in her father; Heart disease in her father and mother; Hypertension in her son.  ROS:   All other ROS reviewed and negative. Pertinent positives noted in the HPI.     EKGs/Labs/Other Studies Reviewed:   The following studies were personally reviewed by me today:  Recent Labs: 07/28/2021: Magnesium 1.9 09/10/2021: ALT 18 04/01/2022: BUN 15; Creatinine, Ser 1.22; Hemoglobin 13.2; Platelets 300; Potassium 5.1; Sodium 138; TSH 0.346   Recent Lipid Panel    Component Value Date/Time   CHOL 161 09/10/2021 1018   TRIG 77 09/10/2021 1018   HDL 78 09/10/2021 1018   CHOLHDL 2.1 09/10/2021 1018   CHOLHDL 2.7 07/29/2021 0610   VLDL 16 07/29/2021 0610   LDLCALC 68 09/10/2021 1018    Physical Exam:   VS:  BP 128/70   Pulse 88   Ht '5\' 4"'$  (1.626 m)   Wt 164 lb (74.4 kg)   SpO2 97%   BMI 28.15 kg/m    Wt Readings from Last 3 Encounters:  05/14/22 164 lb (74.4 kg)  04/01/22 169 lb (76.7 kg)  08/26/21 176 lb (79.8 kg)    General: Well nourished, well developed, in no acute distress Head: Atraumatic, normal size  Eyes: PEERLA, EOMI  Neck: Supple, no JVD Endocrine: No thryomegaly Cardiac: Normal S1, S2; RRR; no murmurs, rubs, or gallops Lungs: Clear to auscultation bilaterally, no wheezing, rhonchi or rales  Abd: Soft, nontender, no hepatomegaly  Ext: No edema, pulses 2+ Musculoskeletal: No deformities, BUE and BLE strength normal and equal Skin: Warm and dry, no rashes   Neuro: Alert and oriented to person, place, time, and situation, CNII-XII grossly intact, no focal  deficits  Psych: Normal mood and affect   ASSESSMENT:   [name] is a 70 y.o. female who presents for the following: 1. SVT (supraventricular tachycardia) (HCC)   2. Palpitations   3. Precordial pain   4. Coronary artery disease involving native coronary artery of native heart without angina pectoris   5. Agatston coronary artery calcium score greater than  400   6. Mixed hyperlipidemia   7. Primary hypertension     PLAN:   1. SVT (supraventricular tachycardia) (HCC) 2. Palpitations -Monitor with SVT.  Can be rapid.  Symptoms can occur over 2 hours.  She is still having episodes of palpitations every 2 to 3 weeks.  She was taken off her beta-blocker.  We will restart this as needed.  I would like to refer her to electrophysiology for ablation consideration.  She really does not tolerate longstanding beta-blocker use.  Recent echo is normal.  Evaluation for chest discomfort and CAD as below.  3. Precordial pain 4. Coronary artery disease involving native coronary artery of native heart without angina pectoris 5. Agatston coronary artery calcium score greater than 400 6. Mixed hyperlipidemia -Coronary CTA last year with nonobstructive disease.  She had diffuse tapering of FFR values.  She is on aspirin.  She is on statin.  LDL cholesterol is at goal.  She does have chest discomfort with SVT.  Otherwise she is without complaints.  Given that she will have surgery and cannot complete greater than 4 METS we will proceed with a nuclear medicine stress test to make sure perfusion is normal.  Referral to EP as above.  7. Primary hypertension -No change to medications.   Shared Decision Making/Informed Consent The risks [chest pain, shortness of breath, cardiac arrhythmias, dizziness, blood pressure fluctuations, myocardial infarction, stroke/transient ischemic attack, nausea, vomiting, allergic reaction, radiation exposure, metallic taste sensation and life-threatening complications  (estimated to be 1 in 10,000)], benefits (risk stratification, diagnosing coronary artery disease, treatment guidance) and alternatives of a nuclear stress test were discussed in detail with Ms. Lynn and she agrees to proceed.  Disposition: Return in about 6 months (around 11/13/2022).  Medication Adjustments/Labs and Tests Ordered: Current medicines are reviewed at length with the patient today.  Concerns regarding medicines are outlined above.  Orders Placed This Encounter  Procedures   Ambulatory referral to Cardiac Electrophysiology   MYOCARDIAL PERFUSION IMAGING   Meds ordered this encounter  Medications   metoprolol tartrate (LOPRESSOR) 25 MG tablet    Sig: Take 1 tablet (25 mg total) by mouth daily as needed (for heart rate over 100 bpm).    Dispense:  180 tablet    Refill:  3    Patient Instructions  Medication Instructions:  Your physician has recommended you make the following change in your medication:  START: Metoprolol tartrate 25 mg as needed for heart rate over 100 *If you need a refill on your cardiac medications before your next appointment, please call your pharmacy*   Lab Work: None If you have labs (blood work) drawn today and your tests are completely normal, you will receive your results only by: [place] (if you have MyChart) OR A paper copy in the mail If you have any lab test that is abnormal or we need to change your treatment, we will call you to review the results.   Testing/Procedures: Your physician has requested that you have a lexiscan myoview. For further information please visit [email or web address]. Please follow instruction sheet, as given.   The test will take approximately 3 to 4 hours to complete; you may bring reading material.  If someone comes with you to your appointment, they will need to remain in the main lobby due to limited space in the testing area. **If you are pregnant or breastfeeding, please notify the nuclear lab prior  to your appointment**  How to prepare for your Myocardial Perfusion Test: Do  not eat or drink 3 hours prior to your test, except you may have water. Do not consume products containing caffeine (regular or decaffeinated) 12 hours prior to your test. (ex: coffee, chocolate, sodas, tea). Do bring a list of your current medications with you.  If not listed below, you may take your medications as normal. Do wear comfortable clothes (no dresses or overalls) and walking shoes, tennis shoes preferred (No heels or open toe shoes are allowed). Do NOT wear cologne, perfume, aftershave, or lotions (deodorant is allowed). If these instructions are not followed, your test will have to be rescheduled.    Follow-Up: At [facility], you and your health needs are our priority.  As part of our continuing mission to provide you with exceptional heart care, we have created designated Provider Care Teams.  These Care Teams include your primary Cardiologist (physician) and Advanced Practice Providers (APPs -  Physician Assistants and Nurse Practitioners) who all work together to provide you with the care you need, when you need it.  We recommend signing up for the patient portal called "MyChart".  Sign up information is provided on this After Visit Summary.  MyChart is used to connect with patients for Virtual Visits (Telemedicine).  Patients are able to view lab/test results, encounter notes, upcoming appointments, etc.  Non-urgent messages can be sent to your provider as well.   To learn more about what you can do with MyChart, go to [email or web address].    Your next appointment:   6 month(s)  The format for your next appointment:   In Person  Provider:   Evalina Field, MD     Other Instructions   Important Information About Sugar         Time Spent with Patient: I have spent a total of 35 minutes with patient reviewing hospital notes, telemetry, EKGs, labs and examining the patient as well  as establishing an assessment and plan that was discussed with the patient.  > 50% of time was spent in direct patient care.  Signed, Addison Naegeli. Audie Box, MD, [place]  [address] [place], [place] [address] [phone]  05/14/2022 12:27 PM

## 2022-05-13 ENCOUNTER — Ambulatory Visit: Payer: Medicare Other | Admitting: Cardiovascular Disease

## 2022-05-14 ENCOUNTER — Ambulatory Visit (INDEPENDENT_AMBULATORY_CARE_PROVIDER_SITE_OTHER): Payer: Medicare Other | Admitting: Cardiovascular Disease

## 2022-05-14 ENCOUNTER — Encounter: Payer: Self-pay | Admitting: Cardiovascular Disease

## 2022-05-14 VITALS — BP 128/70 | HR 88 | Ht 64.0 in | Wt 164.0 lb

## 2022-05-14 DIAGNOSIS — R072 Precordial pain: Secondary | ICD-10-CM

## 2022-05-14 DIAGNOSIS — E782 Mixed hyperlipidemia: Secondary | ICD-10-CM | POA: Diagnosis not present

## 2022-05-14 DIAGNOSIS — I2510 Atherosclerotic heart disease of native coronary artery without angina pectoris: Secondary | ICD-10-CM | POA: Diagnosis not present

## 2022-05-14 DIAGNOSIS — R931 Abnormal findings on diagnostic imaging of heart and coronary circulation: Secondary | ICD-10-CM | POA: Diagnosis not present

## 2022-05-14 DIAGNOSIS — I471 Supraventricular tachycardia: Secondary | ICD-10-CM | POA: Diagnosis not present

## 2022-05-14 DIAGNOSIS — R002 Palpitations: Secondary | ICD-10-CM

## 2022-05-14 DIAGNOSIS — I10 Essential (primary) hypertension: Secondary | ICD-10-CM | POA: Diagnosis not present

## 2022-05-14 MED ORDER — METOPROLOL TARTRATE 25 MG PO TABS: 25.0000 mg | ORAL_TABLET | Freq: Every day | ORAL | 3 refills | Status: DC | PRN

## 2022-05-14 NOTE — Patient Instructions (Signed)
Medication Instructions:  Your physician has recommended you make the following change in your medication:  START: Metoprolol tartrate 25 mg as needed for heart rate over 100 *If you need a refill on your cardiac medications before your next appointment, please call your pharmacy*   Lab Work: None If you have labs (blood work) drawn today and your tests are completely normal, you will receive your results only by: [place] (if you have MyChart) OR A paper copy in the mail If you have any lab test that is abnormal or we need to change your treatment, we will call you to review the results.   Testing/Procedures: Your physician has requested that you have a lexiscan myoview. For further information please visit [email or web address]. Please follow instruction sheet, as given.   The test will take approximately 3 to 4 hours to complete; you may bring reading material.  If someone comes with you to your appointment, they will need to remain in the main lobby due to limited space in the testing area. **If you are pregnant or breastfeeding, please notify the nuclear lab prior to your appointment**  How to prepare for your Myocardial Perfusion Test: Do not eat or drink 3 hours prior to your test, except you may have water. Do not consume products containing caffeine (regular or decaffeinated) 12 hours prior to your test. (ex: coffee, chocolate, sodas, tea). Do bring a list of your current medications with you.  If not listed below, you may take your medications as normal. Do wear comfortable clothes (no dresses or overalls) and walking shoes, tennis shoes preferred (No heels or open toe shoes are allowed). Do NOT wear cologne, perfume, aftershave, or lotions (deodorant is allowed). If these instructions are not followed, your test will have to be rescheduled.    Follow-Up: At [facility], you and your health needs are our priority.  As part of our continuing mission to provide you with  exceptional heart care, we have created designated Provider Care Teams.  These Care Teams include your primary Cardiologist (physician) and Advanced Practice Providers (APPs -  Physician Assistants and Nurse Practitioners) who all work together to provide you with the care you need, when you need it.  We recommend signing up for the patient portal called "MyChart".  Sign up information is provided on this After Visit Summary.  MyChart is used to connect with patients for Virtual Visits (Telemedicine).  Patients are able to view lab/test results, encounter notes, upcoming appointments, etc.  Non-urgent messages can be sent to your provider as well.   To learn more about what you can do with MyChart, go to [email or web address].    Your next appointment:   6 month(s)  The format for your next appointment:   In Person  Provider:   Evalina Field, MD     Other Instructions   Important Information About Sugar

## 2022-05-23 ENCOUNTER — Ambulatory Visit (HOSPITAL_COMMUNITY)
Admission: RE | Admit: 2022-05-23 | Discharge: 2022-05-23 | Disposition: A | Discharge status: Home / Self Care | Payer: Medicare Other | Source: Ambulatory Visit | Attending: Cardiovascular Disease | Admitting: Cardiovascular Disease

## 2022-05-23 DIAGNOSIS — R072 Precordial pain: Secondary | ICD-10-CM

## 2022-05-23 LAB — MYOCARDIAL PERFUSION IMAGING
LV dias vol: 57 mL (ref 46–106)
LV sys vol: 13 mL
Nuc Stress EF: 78 %
Peak HR: 93 {beats}/min
Rest HR: 75 {beats}/min
Rest Nuclear Isotope Dose: 10.6 mCi
SDS: 5
SRS: 3
SSS: 8
ST Depression (mm): 0 mm
Stress Nuclear Isotope Dose: 32 mCi
TID: 1

## 2022-05-23 MED ORDER — TECHNETIUM TC 99M TETROFOSMIN IV KIT
32.0000 | PACK | Freq: Once | INTRAVENOUS | Status: AC | PRN
  Administered 2022-05-23: 32 via INTRAVENOUS

## 2022-05-23 MED ORDER — REGADENOSON 0.4 MG/5ML IV SOLN
0.4000 mg | Freq: Once | INTRAVENOUS | Status: AC
  Administered 2022-05-23: 0.4 mg via INTRAVENOUS

## 2022-05-23 MED ORDER — TECHNETIUM TC 99M TETROFOSMIN IV KIT
10.6000 | PACK | Freq: Once | INTRAVENOUS | Status: AC | PRN
  Administered 2022-05-23: 10.6 via INTRAVENOUS

## 2022-06-19 NOTE — H&P (Signed)
Patient's anticipated LOS is less than 2 midnights, meeting these requirements: - Younger than 49 - Lives within 1 hour of care - Has a competent adult at home to recover with post-op recover - NO history of  - Chronic pain requiring opiods  - Diabetes  - Coronary Artery Disease  - Heart failure  - Heart attack  - Stroke  - DVT/VTE  - Cardiac arrhythmia  - Respiratory Failure/COPD  - Renal failure  - Anemia  - Advanced Liver disease     Stephanie Daniel is an 71 y.o. female.    Chief Complaint: right knee pain  HPI: Pt is a 71 y.o. female complaining of right knee pain for multiple years. Pain had continually increased since the beginning. X-rays in the clinic show end-stage arthritic changes of the right knee. Pt has tried various conservative treatments which have failed to alleviate their symptoms, including injections and therapy. Various options are discussed with the patient. Risks, benefits and expectations were discussed with the patient. Patient understand the risks, benefits and expectations and wishes to proceed with surgery.   PCP:  Kelton Pillar, MD  D/C Plans: Home  PMH: Past Medical History:  Diagnosis Date   Arthritis    Coronary artery disease    Hypertension    Hypothyroidism    Thyroid disease     PSH: Past Surgical History:  Procedure Laterality Date   ABDOMINAL HYSTERECTOMY     APPENDECTOMY     KNEE SURGERY  11/18/2011   TOTAL KNEE ARTHROPLASTY Left 05/04/2017   Procedure: LEFT TOTAL KNEE ARTHROPLASTY;  Surgeon: Paralee Cancel, MD;  Location: [facility];  Service: Orthopedics;  Laterality: Left;  90 mins    Social History:  reports that she quit smoking about 33 years ago. Her smoking use included cigarettes. She has never used smokeless tobacco. She reports that she does not drink alcohol and does not use drugs. BMI: Estimated body mass index is 28.15 kg/m as calculated from the following:   Height as of 05/23/22: '5\' 4"'$  (1.626 m).   Weight as of  05/23/22: 74.4 kg.  Lab Results  Component Value Date   ALBUMIN 4.4 09/10/2021   Diabetes: Patient does not have a diagnosis of diabetes. Lab Results  Component Value Date   HGBA1C 5.1 07/29/2021     Smoking Status: Social History   Tobacco Use  Smoking Status Former   Years: 20.00   Types: Cigarettes   Quit date: 04/28/1989   Years since quitting: 33.1  Smokeless Tobacco Never   The patient is not currently a tobacco user. Counseling given: Not Answered     Allergies:  Allergies  Allergen Reactions   Albuterol Other (See Comments)    Respiratory distress    Medications: No current facility-administered medications for this encounter.   Current Outpatient Medications  Medication Sig Dispense Refill   acyclovir (ZOVIRAX) 400 MG tablet Take 400 mg by mouth 2 (two) times daily.     aspirin EC 81 MG tablet Take 1 tablet (81 mg total) by mouth daily. Swallow whole. 30 tablet 1   atorvastatin (LIPITOR) 40 MG tablet Take 1 tablet (40 mg total) by mouth daily. 90 tablet 2   buPROPion (WELLBUTRIN XL) 150 MG 24 hr tablet Take 150 mg by mouth daily.     gabapentin (NEURONTIN) 300 MG capsule Take 300 mg by mouth at bedtime.     hydrochlorothiazide (MICROZIDE) 12.5 MG capsule Take 1 tablet by mouth daily.     HYDROcodone-acetaminophen (NORCO/VICODIN)  5-325 MG tablet Take 1 tablet by mouth 2 (two) times daily as needed.     levothyroxine (SYNTHROID, LEVOTHROID) 50 MCG tablet Take 50 mcg by mouth daily before breakfast.     lisinopril (ZESTRIL) 20 MG tablet Take 1 tablet (20 mg total) by mouth daily. 90 tablet 2   meloxicam (MOBIC) 15 MG tablet Take 15 mg by mouth 2 (two) times a week.     metoprolol tartrate (LOPRESSOR) 25 MG tablet Take 1 tablet (25 mg total) by mouth daily as needed (for heart rate over 100 bpm). 180 tablet 3   nitroGLYCERIN (NITROSTAT) 0.4 MG SL tablet Place 1 tablet (0.4 mg total) under the tongue every 5 (five) minutes x 3 doses as needed for chest pain. 20  tablet 1   omeprazole (PRILOSEC) 40 MG capsule Take 40 mg by mouth daily.      No results found for this or any previous visit (from the past 48 hour(s)). No results found.  ROS: Pain with rom of the right lower extremity  Physical Exam: Alert and oriented 71 y.o. female in no acute distress Cranial nerves 2-12 intact Cervical spine: full rom with no tenderness, nv intact distally Chest: active breath sounds bilaterally, no wheeze rhonchi or rales Heart: regular rate and rhythm, no murmur Abd: non tender non distended with active bowel sounds Hip is stable with rom  Medial and lateral joint line tenderness Nv intact distally No rashes or edema  Mild antalgic gait  Assessment/Plan Assessment: right knee end stage osteoarthritis  Plan:  Patient will undergo a right total knee by Dr. Veverly Fells at [place] Risks benefits and expectations were discussed with the patient. Patient understand risks, benefits and expectations and wishes to proceed. Preoperative templating of the joint replacement has been completed, documented, and submitted to the Operating Room personnel in order to optimize intra-operative equipment management.   Merla Riches PA-C, MPAS [facility] is now [employer] [address], [place], [place], [place] [address] Phone: [phone] [email or web address] Facebook  [employer]

## 2022-06-27 NOTE — Patient Instructions (Addendum)
SURGICAL WAITING ROOM VISITATION Patients having surgery or a procedure may have no more than 2 support people in the waiting area - these visitors may rotate.   Children under the age of 35 must have an adult with them who is not the patient. If the patient needs to stay at the hospital during part of their recovery, the visitor guidelines for inpatient rooms apply. Pre-op nurse will coordinate an appropriate time for 1 support person to accompany patient in pre-op.  This support person may not rotate.    Please refer to the [facility] website for the visitor guidelines for Inpatients (after your surgery is over and you are in a regular room).       Your procedure is scheduled on:   07/11/2022   Report to [facility] Main Entrance    Report to admitting at   1000AM   Call this number if you have problems the morning of surgery [phone]   Do not eat food :After Midnight.   After Midnight you may have the following liquids until ___ 0930am ___ AM/ PM DAY OF SURGERY  Water Non-Citrus Juices (without pulp, NO RED) Carbonated Beverages Black Coffee (NO MILK/CREAM OR CREAMERS, sugar ok)  Clear Tea (NO MILK/CREAM OR CREAMERS, sugar ok) regular and decaf                             Plain Jell-O (NO RED)                                           Fruit ices (not with fruit pulp, NO RED)                                     Popsicles (NO RED)                                                               Sports drinks like Gatorade (NO RED)                     The day of surgery:  Drink ONE (1) Pre-Surgery Clear Ensure or G2 at  0930  AM  ( HAVE COMPLETED BY )  the morning of surgery. Drink in one sitting. Do not sip.  This drink was given to you during your hospital  pre-op appointment visit. Nothing else to drink after completing the  Pre-Surgery Clear Ensure or G2.           Oral Hygiene is also important to reduce your risk of infection.                                     Remember - BRUSH YOUR TEETH THE MORNING OF SURGERY WITH YOUR REGULAR TOOTHPASTE   Do NOT smoke after Midnight   Take these medicines the morning of surgery with A SIP OF WATER:  wellbutrin, synthroid, lopressor if needed, omeprazole   DO NOT TAKE ANY ORAL DIABETIC MEDICATIONS  DAY OF YOUR SURGERY  Bring CPAP mask and tubing day of surgery.                              You may not have any metal on your body including hair pins, jewelry, and body piercing             Do not wear make-up, lotions, powders, perfumes/cologne, or deodorant  Do not wear nail polish including gel and S&S, artificial/acrylic nails, or any other type of covering on natural nails including finger and toenails. If you have artificial nails, gel coating, etc. that needs to be removed by a nail salon please have this removed prior to surgery or surgery may need to be canceled/ delayed if the surgeon/ anesthesia feels like they are unable to be safely monitored.   Do not shave  48 hours prior to surgery.               Men may shave face and neck.   Do not bring valuables to the hospital. [place].   Contacts, dentures or bridgework may not be worn into surgery.   Bring small overnight bag day of surgery.   DO NOT [place]. PHARMACY WILL DISPENSE MEDICATIONS LISTED ON YOUR MEDICATION LIST TO YOU DURING YOUR ADMISSION [place]!    Patients discharged on the day of surgery will not be allowed to drive home.  Someone NEEDS to stay with you for the first 24 hours after anesthesia.   Special Instructions: Bring a copy of your healthcare power of attorney and living will documents         the day of surgery if you haven't scanned them before.              Please read over the following fact sheets you were given: IF YOU HAVE QUESTIONS ABOUT YOUR PRE-OP INSTRUCTIONS PLEASE CALL [phone]     [facility] - Preparing for  Surgery Before surgery, you can play an important role.  Because skin is not sterile, your skin needs to be as free of germs as possible.  You can reduce the number of germs on your skin by washing with CHG (chlorahexidine gluconate) soap before surgery.  CHG is an antiseptic cleaner which kills germs and bonds with the skin to continue killing germs even after washing. Please DO NOT use if you have an allergy to CHG or antibacterial soaps.  If your skin becomes reddened/irritated stop using the CHG and inform your nurse when you arrive at Short Stay. Do not shave (including legs and underarms) for at least 48 hours prior to the first CHG shower.  You may shave your face/neck. Please follow these instructions carefully:  1.  Shower with CHG Soap the night before surgery and the  morning of Surgery.  2.  If you choose to wash your hair, wash your hair first as usual with your  normal  shampoo.  3.  After you shampoo, rinse your hair and body thoroughly to remove the  shampoo.                           4.  Use CHG as you would any other liquid soap.  You can apply chg directly  to  the skin and wash                       Gently with a scrungie or clean washcloth.  5.  Apply the CHG Soap to your body ONLY FROM THE NECK DOWN.   Do not use on face/ open                           Wound or open sores. Avoid contact with eyes, ears mouth and genitals (private parts).                       Wash face,  Genitals (private parts) with your normal soap.             6.  Wash thoroughly, paying special attention to the area where your surgery  will be performed.  7.  Thoroughly rinse your body with warm water from the neck down.  8.  DO NOT shower/wash with your normal soap after using and rinsing off  the CHG Soap.                9.  Pat yourself dry with a clean towel.            10.  Wear clean pajamas.            11.  Place clean sheets on your bed the night of your first shower and do not  sleep with pets. Day  of Surgery : Do not apply any lotions/deodorants the morning of surgery.  Please wear clean clothes to the hospital/surgery center.  FAILURE TO FOLLOW THESE INSTRUCTIONS MAY RESULT IN THE CANCELLATION OF YOUR SURGERY PATIENT SIGNATURE_________________________________  NURSE SIGNATURE__________________________________  ________________________________________________________________________

## 2022-06-27 NOTE — Progress Notes (Signed)
Anesthesia Review:  PCP: Cardiologist : Chest x-ray : 07/28/21- 1 view  EKG : 04/01/22  07/29/21- Ct cors  Echo : 07/29/21  Stress test: 05/23/22  Monitor- 04/30/22  Cardiac Cath :  Activity level:  Sleep Study/ CPAP : Fasting Blood Sugar :      / Checks Blood Sugar -- times a day:   Blood Thinner/ Instructions /Last Dose: ASA / Instructions/ Last Dose :   81 mg aspirin

## 2022-07-01 ENCOUNTER — Other Ambulatory Visit: Payer: Self-pay

## 2022-07-01 ENCOUNTER — Encounter (HOSPITAL_COMMUNITY): Payer: Self-pay

## 2022-07-01 ENCOUNTER — Encounter (HOSPITAL_COMMUNITY)
Admission: RE | Admit: 2022-07-01 | Discharge: 2022-07-01 | Disposition: A | Discharge status: Home / Self Care | Payer: Medicare Other | Source: Ambulatory Visit | Attending: Orthopedic Surgery | Admitting: Orthopedic Surgery

## 2022-07-01 VITALS — BP 124/84 | HR 83 | Temp 97.9°F | Resp 16 | Ht 64.0 in

## 2022-07-01 DIAGNOSIS — Z01818 Encounter for other preprocedural examination: Secondary | ICD-10-CM | POA: Diagnosis not present

## 2022-07-01 HISTORY — DX: Personal history of urinary calculi: Z87.442

## 2022-07-01 HISTORY — DX: Gastro-esophageal reflux disease without esophagitis: K21.9

## 2022-07-01 LAB — SURGICAL PCR SCREEN
MRSA, PCR: NEGATIVE
Staphylococcus aureus: NEGATIVE

## 2022-07-01 LAB — BASIC METABOLIC PANEL
Anion gap: 7 (ref 5–15)
BUN: 17 mg/dL (ref 8–23)
CO2: 26 mmol/L (ref 22–32)
Calcium: 9.4 mg/dL (ref 8.9–10.3)
Chloride: 107 mmol/L (ref 98–111)
Creatinine, Ser: 1.21 mg/dL — ABNORMAL HIGH (ref 0.44–1.00)
GFR, Estimated: 47 mL/min — ABNORMAL LOW (ref >60)
Glucose, Bld: 104 mg/dL — ABNORMAL HIGH (ref 70–99)
Potassium: 4.5 mmol/L (ref 3.5–5.1)
Sodium: 140 mmol/L (ref 135–145)

## 2022-07-01 LAB — CBC
HCT: 42.3 % (ref 36.0–46.0)
Hemoglobin: 13.9 g/dL (ref 12.0–15.0)
MCH: 32.7 pg (ref 26.0–34.0)
MCHC: 32.9 g/dL (ref 30.0–36.0)
MCV: 99.5 fL (ref 80.0–100.0)
Platelets: 335 10*3/uL (ref 150–400)
RBC: 4.25 MIL/uL (ref 3.87–5.11)
RDW: 14.7 % (ref 11.5–15.5)
WBC: 8 10*3/uL (ref 4.0–10.5)
nRBC: 0 % (ref 0.0–0.2)

## 2022-07-09 NOTE — Progress Notes (Signed)
Anesthesia Chart Review   Case: [id number] Date/Time: 07/11/22 1020   Procedure: TOTAL KNEE ARTHROPLASTY (Right: Knee)   Anesthesia type: Spinal   Pre-op diagnosis: Arthritis of right knee end stage   Location: [facility] ROOM 06 / [facility]   Surgeons: Netta Cedars, MD       DISCUSSION:71 y.o. former smoker with h/o HTN, CAD, hypothyroidism, right knee OA scheduled for above procedure 07/11/2022 with Dr. Netta Cedars.   Pt seen by cardiology 05/14/2022 for evaluation of SVT.  Pt takes beta-blocker as needed. Stress test ordered at this visit for risk stratification prior to upcoming knee procedure.   Stress Test 05/23/2022 low risk study.   Anticipate pt can proceed with planned procedure barring acute status change.   VS: BP 124/84   Pulse 83   Temp 36.6 C (Oral)   Resp 16   Ht '5\' 4"'$  (1.626 m)   SpO2 97%   BMI 28.15 kg/m   PROVIDERS: Kelton Pillar, MD is PCP   Cardiologist : Eleonore Chiquito LOV 05/14/22  LABS: Labs reviewed: Acceptable for surgery. (all labs ordered are listed, but only abnormal results are displayed)  Labs Reviewed  BASIC METABOLIC PANEL - Abnormal; Notable for the following components:      Result Value   Glucose, Bld 104 (*)    Creatinine, Ser 1.21 (*)    GFR, Estimated 47 (*)    All other components within normal limits  SURGICAL PCR SCREEN  CBC     IMAGES:   EKG:   CV: Myocardial Perfusion 05/23/2022   Findings are consistent with no prior ischemia. The study is low risk.   No ST deviation was noted.   Left ventricular function is normal. Nuclear stress EF: 78 %. The left ventricular ejection fraction is hyperdynamic (>65%). End diastolic cavity size is normal. End systolic cavity size is normal.   No reversible ischemia. LVEF 78% with normal wall motion. This is a low risk study. No prior for comparison.  Echo 07/29/2021 1. Left ventricular ejection fraction, by estimation, is 60 to 65%. Left  ventricular ejection fraction by 3D volume is 62 %. The  left ventricle has  normal function. The left ventricle has no regional wall motion  abnormalities. Left ventricular diastolic   parameters are consistent with Grade I diastolic dysfunction (impaired  relaxation).   2. Right ventricular systolic function is low normal. The right  ventricular size is normal.   3. Anterior fat pad noted overlying the RV.   4. The mitral valve is grossly normal. Trivial mitral valve  regurgitation.   5. The aortic valve is tricuspid. Aortic valve regurgitation is not  visualized.   6. The inferior vena cava is normal in size with greater than 50%  respiratory variability, suggesting right atrial pressure of 3 mmHg.  Past Medical History:  Diagnosis Date   Arthritis    Coronary artery disease    GERD (gastroesophageal reflux disease)    History of kidney stones    Hypertension    Hypothyroidism    Thyroid disease     Past Surgical History:  Procedure Laterality Date   ABDOMINAL HYSTERECTOMY     APPENDECTOMY     KNEE SURGERY  11/18/2011   TONSILLECTOMY     TOTAL KNEE ARTHROPLASTY Left 05/04/2017   Procedure: LEFT TOTAL KNEE ARTHROPLASTY;  Surgeon: Paralee Cancel, MD;  Location: [facility];  Service: Orthopedics;  Laterality: Left;  90 mins    MEDICATIONS:  acyclovir (ZOVIRAX) 400 MG tablet  aspirin EC 81 MG tablet   atorvastatin (LIPITOR) 40 MG tablet   buPROPion (WELLBUTRIN XL) 150 MG 24 hr tablet   gabapentin (NEURONTIN) 300 MG capsule   HYDROcodone-acetaminophen (NORCO/VICODIN) 5-325 MG tablet   levothyroxine (SYNTHROID, LEVOTHROID) 50 MCG tablet   lisinopril (ZESTRIL) 20 MG tablet   meloxicam (MOBIC) 15 MG tablet   metoprolol tartrate (LOPRESSOR) 25 MG tablet   nitroGLYCERIN (NITROSTAT) 0.4 MG SL tablet   omeprazole (PRILOSEC) 40 MG capsule   Potassium 99 MG TABS   No current facility-administered medications for this encounter.     Konrad Felix Ward, PA-C [facility] [phone]

## 2022-07-10 NOTE — Anesthesia Preprocedure Evaluation (Addendum)
Anesthesia Evaluation  Patient identified by MRN, date of birth, ID band Patient awake    Reviewed: Allergy & Precautions, NPO status , Patient's Chart, lab work & pertinent test results  Airway Mallampati: II  TM Distance: >3 FB Neck ROM: Full    Dental no notable dental hx.    Pulmonary former smoker,    Pulmonary exam normal        Cardiovascular hypertension, Pt. on medications and Pt. on home beta blockers + CAD  Normal cardiovascular exam+ dysrhythmias Supra Ventricular Tachycardia      Neuro/Psych negative neurological ROS  negative psych ROS   GI/Hepatic Neg liver ROS, GERD  Medicated and Controlled,  Endo/Other  Hypothyroidism   Renal/GU Renal disease     Musculoskeletal  (+) Arthritis ,   Abdominal   Peds  Hematology negative hematology ROS (+)   Anesthesia Other Findings Arthritis of right knee end stage  Reproductive/Obstetrics                           Anesthesia Physical Anesthesia Plan  ASA: 3  Anesthesia Plan: Regional and Spinal   Post-op Pain Management:    Induction: Intravenous  PONV Risk Score and Plan: 2 and Ondansetron, Dexamethasone, Midazolam and Treatment may vary due to age or medical condition  Airway Management Planned: Simple Face Mask  Additional Equipment:   Intra-op Plan:   Post-operative Plan:   Informed Consent: I have reviewed the patients History and Physical, chart, labs and discussed the procedure including the risks, benefits and alternatives for the proposed anesthesia with the patient or authorized representative who has indicated his/her understanding and acceptance.     Dental advisory given  Plan Discussed with: CRNA  Anesthesia Plan Comments:         Anesthesia Quick Evaluation

## 2022-07-11 ENCOUNTER — Ambulatory Visit (HOSPITAL_COMMUNITY): Payer: Medicare Other | Admitting: Physician Assistant

## 2022-07-11 ENCOUNTER — Encounter (HOSPITAL_COMMUNITY)
Admission: RE | Disposition: A | Discharge status: Home / Self Care | Payer: Self-pay | Source: Ambulatory Visit | Attending: Orthopedic Surgery

## 2022-07-11 ENCOUNTER — Other Ambulatory Visit: Payer: Self-pay

## 2022-07-11 ENCOUNTER — Observation Stay (HOSPITAL_COMMUNITY)
Admission: RE | Admit: 2022-07-11 | Discharge: 2022-07-12 | Disposition: A | Discharge status: Home / Self Care | Payer: Medicare Other | Source: Ambulatory Visit | Attending: Orthopedic Surgery | Admitting: Orthopedic Surgery

## 2022-07-11 ENCOUNTER — Ambulatory Visit (HOSPITAL_COMMUNITY): Payer: Medicare Other | Admitting: Certified Registered Nurse Anesthetist

## 2022-07-11 ENCOUNTER — Institutional Professional Consult (permissible substitution): Payer: Medicare Other | Admitting: Cardiology

## 2022-07-11 ENCOUNTER — Encounter (HOSPITAL_COMMUNITY): Payer: Self-pay | Admitting: Orthopedic Surgery

## 2022-07-11 DIAGNOSIS — Z87891 Personal history of nicotine dependence: Secondary | ICD-10-CM

## 2022-07-11 DIAGNOSIS — E039 Hypothyroidism, unspecified: Secondary | ICD-10-CM

## 2022-07-11 DIAGNOSIS — M1711 Unilateral primary osteoarthritis, right knee: Secondary | ICD-10-CM | POA: Diagnosis not present

## 2022-07-11 DIAGNOSIS — Z7982 Long term (current) use of aspirin: Secondary | ICD-10-CM | POA: Diagnosis not present

## 2022-07-11 DIAGNOSIS — Z96651 Presence of right artificial knee joint: Principal | ICD-10-CM

## 2022-07-11 DIAGNOSIS — I2510 Atherosclerotic heart disease of native coronary artery without angina pectoris: Secondary | ICD-10-CM | POA: Insufficient documentation

## 2022-07-11 DIAGNOSIS — Z01818 Encounter for other preprocedural examination: Secondary | ICD-10-CM

## 2022-07-11 DIAGNOSIS — G8918 Other acute postprocedural pain: Secondary | ICD-10-CM | POA: Diagnosis not present

## 2022-07-11 DIAGNOSIS — Z96652 Presence of left artificial knee joint: Secondary | ICD-10-CM | POA: Diagnosis not present

## 2022-07-11 DIAGNOSIS — I10 Essential (primary) hypertension: Secondary | ICD-10-CM | POA: Insufficient documentation

## 2022-07-11 DIAGNOSIS — Z79899 Other long term (current) drug therapy: Secondary | ICD-10-CM | POA: Diagnosis not present

## 2022-07-11 HISTORY — PX: TOTAL KNEE ARTHROPLASTY: SHX125

## 2022-07-11 SURGERY — ARTHROPLASTY, KNEE, TOTAL
Anesthesia: Regional | Site: Knee | Laterality: Right

## 2022-07-11 MED ORDER — TRANEXAMIC ACID-NACL 1000-0.7 MG/100ML-% IV SOLN
1000.0000 mg | INTRAVENOUS | Status: AC
  Administered 2022-07-11: 1000 mg via INTRAVENOUS
  Filled 2022-07-11: qty 100

## 2022-07-11 MED ORDER — PROPOFOL 500 MG/50ML IV EMUL
INTRAVENOUS | Status: DC | PRN
  Administered 2022-07-11: 100 ug/kg/min via INTRAVENOUS

## 2022-07-11 MED ORDER — PANTOPRAZOLE SODIUM 40 MG PO TBEC
40.0000 mg | DELAYED_RELEASE_TABLET | Freq: Every day | ORAL | Status: DC
  Administered 2022-07-11 – 2022-07-12 (×2): 40 mg via ORAL
  Filled 2022-07-11 (×2): qty 1

## 2022-07-11 MED ORDER — PHENOL 1.4 % MT LIQD: 1.0000 | OROMUCOSAL | Status: DC | PRN

## 2022-07-11 MED ORDER — METOCLOPRAMIDE HCL 5 MG/ML IJ SOLN: 5.0000 mg | Freq: Three times a day (TID) | INTRAMUSCULAR | Status: DC | PRN

## 2022-07-11 MED ORDER — MENTHOL 3 MG MT LOZG: 1.0000 | LOZENGE | OROMUCOSAL | Status: DC | PRN

## 2022-07-11 MED ORDER — CHLORHEXIDINE GLUCONATE 0.12 % MT SOLN
15.0000 mL | Freq: Once | OROMUCOSAL | Status: AC
  Administered 2022-07-11: 15 mL via OROMUCOSAL

## 2022-07-11 MED ORDER — PROPOFOL 500 MG/50ML IV EMUL
INTRAVENOUS | Status: AC
  Filled 2022-07-11: qty 50

## 2022-07-11 MED ORDER — FENTANYL CITRATE PF 50 MCG/ML IJ SOSY
100.0000 ug | PREFILLED_SYRINGE | INTRAMUSCULAR | Status: DC
  Administered 2022-07-11: 50 ug via INTRAVENOUS
  Filled 2022-07-11: qty 2

## 2022-07-11 MED ORDER — DEXAMETHASONE SODIUM PHOSPHATE 10 MG/ML IJ SOLN
INTRAMUSCULAR | Status: DC | PRN
  Administered 2022-07-11: 5 mg via INTRAVENOUS

## 2022-07-11 MED ORDER — FENTANYL CITRATE PF 50 MCG/ML IJ SOSY: 25.0000 ug | PREFILLED_SYRINGE | INTRAMUSCULAR | Status: DC | PRN

## 2022-07-11 MED ORDER — DOCUSATE SODIUM 100 MG PO CAPS
100.0000 mg | ORAL_CAPSULE | Freq: Two times a day (BID) | ORAL | Status: DC
  Administered 2022-07-11 – 2022-07-12 (×2): 100 mg via ORAL
  Filled 2022-07-11 (×2): qty 1

## 2022-07-11 MED ORDER — ONDANSETRON HCL 4 MG/2ML IJ SOLN
INTRAMUSCULAR | Status: AC
  Filled 2022-07-11: qty 2

## 2022-07-11 MED ORDER — PROPOFOL 10 MG/ML IV BOLUS
INTRAVENOUS | Status: DC | PRN
  Administered 2022-07-11: 30 mg via INTRAVENOUS
  Administered 2022-07-11: 20 mg via INTRAVENOUS

## 2022-07-11 MED ORDER — ASPIRIN EC 81 MG PO TBEC: 81.0000 mg | DELAYED_RELEASE_TABLET | Freq: Two times a day (BID) | ORAL | 0 refills | Status: AC

## 2022-07-11 MED ORDER — METHOCARBAMOL 500 MG PO TABS: 500.0000 mg | ORAL_TABLET | Freq: Three times a day (TID) | ORAL | 1 refills | Status: DC | PRN

## 2022-07-11 MED ORDER — MELOXICAM 15 MG PO TABS: 15.0000 mg | ORAL_TABLET | Freq: Every day | ORAL | Status: DC | PRN

## 2022-07-11 MED ORDER — BUPROPION HCL ER (XL) 150 MG PO TB24
150.0000 mg | ORAL_TABLET | Freq: Every day | ORAL | Status: DC
  Administered 2022-07-12: 150 mg via ORAL
  Filled 2022-07-11: qty 1

## 2022-07-11 MED ORDER — BUPIVACAINE-EPINEPHRINE (PF) 0.5% -1:200000 IJ SOLN
INTRAMUSCULAR | Status: AC
Start: 2022-07-11 — End: ?
  Filled 2022-07-11: qty 30

## 2022-07-11 MED ORDER — ASPIRIN 81 MG PO CHEW
81.0000 mg | CHEWABLE_TABLET | Freq: Two times a day (BID) | ORAL | Status: DC
  Administered 2022-07-11 – 2022-07-12 (×2): 81 mg via ORAL
  Filled 2022-07-11 (×2): qty 1

## 2022-07-11 MED ORDER — SODIUM CHLORIDE 0.9 % IR SOLN
Status: DC | PRN
  Administered 2022-07-11: 1000 mL

## 2022-07-11 MED ORDER — TRANEXAMIC ACID-NACL 1000-0.7 MG/100ML-% IV SOLN
1000.0000 mg | Freq: Once | INTRAVENOUS | Status: AC
  Administered 2022-07-11: 1000 mg via INTRAVENOUS
  Filled 2022-07-11: qty 100

## 2022-07-11 MED ORDER — LISINOPRIL 20 MG PO TABS
20.0000 mg | ORAL_TABLET | Freq: Every day | ORAL | Status: DC
  Administered 2022-07-12: 20 mg via ORAL
  Filled 2022-07-11: qty 1

## 2022-07-11 MED ORDER — POTASSIUM 99 MG PO TABS: 2.0000 | ORAL_TABLET | Freq: Every day | ORAL | Status: DC

## 2022-07-11 MED ORDER — OXYCODONE-ACETAMINOPHEN 5-325 MG PO TABS: 1.0000 | ORAL_TABLET | ORAL | 0 refills | Status: DC | PRN

## 2022-07-11 MED ORDER — OXYCODONE HCL 5 MG PO TABS
5.0000 mg | ORAL_TABLET | ORAL | Status: DC | PRN
  Administered 2022-07-11 (×3): 10 mg via ORAL
  Administered 2022-07-12: 5 mg via ORAL
  Administered 2022-07-12 (×2): 10 mg via ORAL
  Filled 2022-07-11 (×4): qty 2
  Filled 2022-07-11: qty 1
  Filled 2022-07-11: qty 2

## 2022-07-11 MED ORDER — BUPIVACAINE-EPINEPHRINE (PF) 0.5% -1:200000 IJ SOLN
INTRAMUSCULAR | Status: DC | PRN
  Administered 2022-07-11: 30 mL via PERINEURAL

## 2022-07-11 MED ORDER — SODIUM CHLORIDE (PF) 0.9 % IJ SOLN
INTRAMUSCULAR | Status: DC | PRN
  Administered 2022-07-11: 30 mL

## 2022-07-11 MED ORDER — BUPIVACAINE-EPINEPHRINE (PF) 0.5% -1:200000 IJ SOLN
INTRAMUSCULAR | Status: DC | PRN
  Administered 2022-07-11: 30 mL

## 2022-07-11 MED ORDER — DEXAMETHASONE SODIUM PHOSPHATE 10 MG/ML IJ SOLN
INTRAMUSCULAR | Status: AC
  Filled 2022-07-11: qty 1

## 2022-07-11 MED ORDER — SODIUM CHLORIDE (PF) 0.9 % IJ SOLN
INTRAMUSCULAR | Status: AC
  Filled 2022-07-11: qty 30

## 2022-07-11 MED ORDER — SODIUM CHLORIDE 0.9 % IV SOLN: INTRAVENOUS | Status: DC

## 2022-07-11 MED ORDER — BUPIVACAINE LIPOSOME 1.3 % IJ SUSP
INTRAMUSCULAR | Status: AC
  Filled 2022-07-11: qty 20

## 2022-07-11 MED ORDER — 0.9 % SODIUM CHLORIDE (POUR BTL) OPTIME
TOPICAL | Status: DC | PRN
  Administered 2022-07-11: 1000 mL

## 2022-07-11 MED ORDER — AMISULPRIDE (ANTIEMETIC) 5 MG/2ML IV SOLN: 10.0000 mg | Freq: Once | INTRAVENOUS | Status: DC | PRN

## 2022-07-11 MED ORDER — HYDROMORPHONE HCL 1 MG/ML IJ SOLN: 0.5000 mg | INTRAMUSCULAR | Status: DC | PRN

## 2022-07-11 MED ORDER — MIDAZOLAM HCL 2 MG/2ML IJ SOLN
INTRAMUSCULAR | Status: AC
  Filled 2022-07-11: qty 2

## 2022-07-11 MED ORDER — WATER FOR IRRIGATION, STERILE IR SOLN
Status: DC | PRN
  Administered 2022-07-11: 2000 mL

## 2022-07-11 MED ORDER — BUPIVACAINE IN DEXTROSE 0.75-8.25 % IT SOLN
INTRATHECAL | Status: DC | PRN
  Administered 2022-07-11: 1.6 mL via INTRATHECAL

## 2022-07-11 MED ORDER — GABAPENTIN 300 MG PO CAPS
300.0000 mg | ORAL_CAPSULE | Freq: Every day | ORAL | Status: DC
  Administered 2022-07-11: 300 mg via ORAL
  Filled 2022-07-11: qty 1

## 2022-07-11 MED ORDER — BUPIVACAINE LIPOSOME 1.3 % IJ SUSP: 20.0000 mL | Freq: Once | INTRAMUSCULAR | Status: DC

## 2022-07-11 MED ORDER — BUPIVACAINE LIPOSOME 1.3 % IJ SUSP
INTRAMUSCULAR | Status: DC | PRN
  Administered 2022-07-11: 20 mL

## 2022-07-11 MED ORDER — ORAL CARE MOUTH RINSE: 15.0000 mL | Freq: Once | OROMUCOSAL | Status: AC

## 2022-07-11 MED ORDER — CEFAZOLIN SODIUM-DEXTROSE 2-4 GM/100ML-% IV SOLN
2.0000 g | INTRAVENOUS | Status: AC
  Administered 2022-07-11: 2 g via INTRAVENOUS
  Filled 2022-07-11: qty 100

## 2022-07-11 MED ORDER — ONDANSETRON HCL 4 MG PO TABS: 4.0000 mg | ORAL_TABLET | Freq: Three times a day (TID) | ORAL | 1 refills | Status: DC | PRN

## 2022-07-11 MED ORDER — CEFAZOLIN SODIUM-DEXTROSE 2-4 GM/100ML-% IV SOLN
2.0000 g | Freq: Four times a day (QID) | INTRAVENOUS | Status: AC
  Administered 2022-07-11 (×2): 2 g via INTRAVENOUS
  Filled 2022-07-11 (×2): qty 100

## 2022-07-11 MED ORDER — ACETAMINOPHEN 325 MG PO TABS
325.0000 mg | ORAL_TABLET | Freq: Four times a day (QID) | ORAL | Status: DC | PRN
  Administered 2022-07-12: 650 mg via ORAL
  Filled 2022-07-11: qty 2

## 2022-07-11 MED ORDER — MIDAZOLAM HCL 2 MG/2ML IJ SOLN
2.0000 mg | INTRAMUSCULAR | Status: DC
  Administered 2022-07-11: 2 mg via INTRAVENOUS
  Filled 2022-07-11: qty 2

## 2022-07-11 MED ORDER — ONDANSETRON HCL 4 MG/2ML IJ SOLN
4.0000 mg | Freq: Four times a day (QID) | INTRAMUSCULAR | Status: DC | PRN
  Administered 2022-07-11 – 2022-07-12 (×3): 4 mg via INTRAVENOUS
  Filled 2022-07-11 (×3): qty 2

## 2022-07-11 MED ORDER — NITROGLYCERIN 0.4 MG SL SUBL: 0.4000 mg | SUBLINGUAL_TABLET | SUBLINGUAL | Status: DC | PRN

## 2022-07-11 MED ORDER — POVIDONE-IODINE 10 % EX SWAB
2.0000 | Freq: Once | CUTANEOUS | Status: AC
  Administered 2022-07-11: 2 via TOPICAL

## 2022-07-11 MED ORDER — PROPOFOL 1000 MG/100ML IV EMUL
INTRAVENOUS | Status: AC
  Filled 2022-07-11: qty 100

## 2022-07-11 MED ORDER — ONDANSETRON HCL 4 MG/2ML IJ SOLN: 4.0000 mg | Freq: Once | INTRAMUSCULAR | Status: DC | PRN

## 2022-07-11 MED ORDER — METOCLOPRAMIDE HCL 5 MG PO TABS: 5.0000 mg | ORAL_TABLET | Freq: Three times a day (TID) | ORAL | Status: DC | PRN

## 2022-07-11 MED ORDER — METOPROLOL TARTRATE 25 MG PO TABS: 25.0000 mg | ORAL_TABLET | Freq: Every day | ORAL | Status: DC | PRN

## 2022-07-11 MED ORDER — ACETAMINOPHEN 500 MG PO TABS
1000.0000 mg | ORAL_TABLET | Freq: Once | ORAL | Status: AC
  Administered 2022-07-11: 1000 mg via ORAL
  Filled 2022-07-11: qty 2

## 2022-07-11 MED ORDER — ONDANSETRON HCL 4 MG PO TABS: 4.0000 mg | ORAL_TABLET | Freq: Four times a day (QID) | ORAL | Status: DC | PRN

## 2022-07-11 MED ORDER — ACYCLOVIR 400 MG PO TABS
400.0000 mg | ORAL_TABLET | Freq: Two times a day (BID) | ORAL | Status: DC
  Administered 2022-07-11 – 2022-07-12 (×3): 400 mg via ORAL
  Filled 2022-07-11 (×3): qty 1

## 2022-07-11 MED ORDER — METHOCARBAMOL 500 MG IVPB - SIMPLE MED: 500.0000 mg | Freq: Four times a day (QID) | INTRAVENOUS | Status: DC | PRN

## 2022-07-11 MED ORDER — POLYETHYLENE GLYCOL 3350 17 G PO PACK
17.0000 g | PACK | Freq: Every day | ORAL | Status: DC | PRN
  Administered 2022-07-12: 17 g via ORAL
  Filled 2022-07-11: qty 1

## 2022-07-11 MED ORDER — ATORVASTATIN CALCIUM 40 MG PO TABS
40.0000 mg | ORAL_TABLET | Freq: Every day | ORAL | Status: DC
  Administered 2022-07-11 – 2022-07-12 (×2): 40 mg via ORAL
  Filled 2022-07-11 (×2): qty 1

## 2022-07-11 MED ORDER — LEVOTHYROXINE SODIUM 50 MCG PO TABS
50.0000 ug | ORAL_TABLET | Freq: Every day | ORAL | Status: DC
  Administered 2022-07-12: 50 ug via ORAL
  Filled 2022-07-11: qty 1

## 2022-07-11 MED ORDER — PHENYLEPHRINE HCL-NACL 20-0.9 MG/250ML-% IV SOLN
INTRAVENOUS | Status: DC | PRN
  Administered 2022-07-11: 30 ug/min via INTRAVENOUS

## 2022-07-11 MED ORDER — METHOCARBAMOL 500 MG PO TABS
500.0000 mg | ORAL_TABLET | Freq: Four times a day (QID) | ORAL | Status: DC | PRN
  Administered 2022-07-11 – 2022-07-12 (×4): 500 mg via ORAL
  Filled 2022-07-11 (×4): qty 1

## 2022-07-11 MED ORDER — ONDANSETRON HCL 4 MG/2ML IJ SOLN
INTRAMUSCULAR | Status: DC | PRN
  Administered 2022-07-11: 4 mg via INTRAVENOUS

## 2022-07-11 MED ORDER — LACTATED RINGERS IV SOLN: INTRAVENOUS | Status: DC

## 2022-07-11 SURGICAL SUPPLY — 63 items
ATTUNE PSFEM RTSZ5 NARCEM KNEE (Femur) IMPLANT
ATTUNE PSRP INSR SZ5 5 KNEE (Insert) IMPLANT
BAG COUNTER SPONGE SURGICOUNT (BAG) IMPLANT
BAG SPEC THK2 15X12 ZIP CLS (MISCELLANEOUS) ×1
BAG SPNG CNTER NS LX DISP (BAG) ×1
BAG ZIPLOCK 12X15 (MISCELLANEOUS) IMPLANT
BASE TIBIAL ROT PLAT SZ 5 KNEE (Knees) IMPLANT
BLADE SAG 18X100X1.27 (BLADE) ×2 IMPLANT
BLADE SAW SGTL 13X75X1.27 (BLADE) ×2 IMPLANT
BNDG CMPR MED 10X6 ELC LF (GAUZE/BANDAGES/DRESSINGS) ×1
BNDG ELASTIC 6X10 VLCR STRL LF (GAUZE/BANDAGES/DRESSINGS) ×2 IMPLANT
BNDG GAUZE DERMACEA FLUFF (GAUZE/BANDAGES/DRESSINGS) ×1
BNDG GAUZE DERMACEA FLUFF 4 (GAUZE/BANDAGES/DRESSINGS) ×2 IMPLANT
BNDG GZE DERMACEA 4 6PLY (GAUZE/BANDAGES/DRESSINGS) ×1
BOWL SMART MIX CTS (DISPOSABLE) ×2 IMPLANT
BSPLAT TIB 5 CMNT ROT PLAT STR (Knees) ×1 IMPLANT
CEMENT HV SMART SET (Cement) ×4 IMPLANT
CLOSURE STERI STRIP 1/2 X4 (GAUZE/BANDAGES/DRESSINGS) IMPLANT
COVER SURGICAL LIGHT HANDLE (MISCELLANEOUS) ×2 IMPLANT
CUFF TOURN SGL QUICK 34 (TOURNIQUET CUFF) ×1
CUFF TRNQT CYL 34X4.125X (TOURNIQUET CUFF) ×2 IMPLANT
DRAPE INCISE IOBAN 66X45 STRL (DRAPES) ×2 IMPLANT
DRAPE SHEET LG 3/4 BI-LAMINATE (DRAPES) ×2 IMPLANT
DRAPE U-SHAPE 47X51 STRL (DRAPES) ×2 IMPLANT
DRSG ADAPTIC 3X8 NADH LF (GAUZE/BANDAGES/DRESSINGS) ×2 IMPLANT
DRSG PAD ABDOMINAL 8X10 ST (GAUZE/BANDAGES/DRESSINGS) ×2 IMPLANT
DURAPREP 26ML APPLICATOR (WOUND CARE) ×2 IMPLANT
ELECT REM PT RETURN 15FT ADLT (MISCELLANEOUS) ×2 IMPLANT
GAUZE SPONGE 4X4 12PLY STRL (GAUZE/BANDAGES/DRESSINGS) ×2 IMPLANT
GLOVE BIO SURGEON STRL SZ8.5 (GLOVE) IMPLANT
GLOVE BIOGEL M STRL SZ7.5 (GLOVE) IMPLANT
GLOVE BIOGEL M SZ8.5 STRL (GLOVE) IMPLANT
GLOVE BIOGEL PI IND STRL 7.5 (GLOVE) ×2 IMPLANT
GLOVE BIOGEL PI IND STRL 8.5 (GLOVE) ×2 IMPLANT
GLOVE BIOGEL PI INDICATOR 7.5 (GLOVE) ×1
GLOVE BIOGEL PI INDICATOR 8.5 (GLOVE) ×1
GLOVE BIOGEL PI ORTHO PRO 7.5 (GLOVE) ×1
GLOVE ECLIPSE 7.5 STRL STRAW (GLOVE) IMPLANT
GLOVE ORTHO TXT STRL SZ7.5 (GLOVE) ×2 IMPLANT
GLOVE PI ORTHO PRO STRL 7.5 (GLOVE) IMPLANT
GLOVE SURG ORTHO 8.5 STRL (GLOVE) ×4 IMPLANT
GOWN STRL REUS W/ TWL XL LVL3 (GOWN DISPOSABLE) ×4 IMPLANT
GOWN STRL REUS W/TWL XL LVL3 (GOWN DISPOSABLE) ×2
HANDPIECE INTERPULSE COAX TIP (DISPOSABLE) ×1
HOLDER FOLEY CATH W/STRAP (MISCELLANEOUS) IMPLANT
IMMOBILIZER KNEE 20 (SOFTGOODS) ×1
IMMOBILIZER KNEE 20 THIGH 36 (SOFTGOODS) IMPLANT
KIT TURNOVER KIT A (KITS) IMPLANT
MANIFOLD NEPTUNE II (INSTRUMENTS) ×2 IMPLANT
NS IRRIG 1000ML POUR BTL (IV SOLUTION) ×2 IMPLANT
PACK TOTAL KNEE CUSTOM (KITS) ×2 IMPLANT
PATELLA MEDIAL ATTUN 35MM KNEE (Knees) IMPLANT
PROTECTOR NERVE ULNAR (MISCELLANEOUS) ×2 IMPLANT
SET HNDPC FAN SPRY TIP SCT (DISPOSABLE) ×2 IMPLANT
STAPLER VISISTAT 35W (STAPLE) IMPLANT
STRIP CLOSURE SKIN 1/2X4 (GAUZE/BANDAGES/DRESSINGS) ×4 IMPLANT
SUT MNCRL AB 3-0 PS2 18 (SUTURE) ×2 IMPLANT
SUT VIC AB 0 CT1 36 (SUTURE) ×2 IMPLANT
SUT VIC AB 1 CT1 36 (SUTURE) ×6 IMPLANT
SUT VIC AB 2-0 CT1 27 (SUTURE) ×1
SUT VIC AB 2-0 CT1 TAPERPNT 27 (SUTURE) ×2 IMPLANT
TIBIAL BASE ROT PLAT SZ 5 KNEE (Knees) ×1 IMPLANT
WATER STERILE IRR 1000ML POUR (IV SOLUTION) ×4 IMPLANT

## 2022-07-11 NOTE — Op Note (Signed)
NAMESENIAH, LAWRENCE MEDICAL RECORD NO: [id number] ACCOUNT NO: [id number] DATE OF BIRTH: [birth year] FACILITY: [facility] PHYSICIAN: Doran Heater. Veverly Fells, MD  Operative Report   DATE OF PROCEDURE: 07/11/2022  PREOPERATIVE DIAGNOSIS:  Right knee end-stage arthritis.  POSTOPERATIVE DIAGNOSIS:  Right knee end-stage arthritis.  PROCEDURE PERFORMED:  Right total knee arthroplasty using DePuy Attune prosthesis.  ATTENDING SURGEON:  Doran Heater. Veverly Fells, MD  ASSISTANT:  Charletta Cousin [place], [place], who was scrubbed during the entire procedure, and necessary for satisfactory completion of surgery.  ANESTHESIA:  Spinal anesthesia was used plus adductor canal block.  ESTIMATED BLOOD LOSS:  Minimal.  FLUID REPLACEMENT:  1500 mL crystalloid.  INSTRUMENT COUNTS:  Correct.  COMPLICATIONS:  No complications.  ANTIBIOTICS:  Perioperative antibiotics were given.  INDICATIONS:  The patient is a 71 year old female who presents with a history of worsening right knee pain secondary to end-stage arthritis.  The patient has failed conservative management, desires operative treatment to relieve pain and restore  function.  She has had previous total knee arthroplasty on the left years ago and had done well with that.  Pain is gotten significantly worse recently to the point where she really has not been able to walk for the last several months on her knee.  She  presents now for total knee arthroplasty to restore function and quality of life.  Informed consent obtained.  DESCRIPTION OF PROCEDURE:  After an adequate level of anesthesia was achieved, the patient was positioned supine on the operating table.  Right leg correctly identified.  A nonsterile tourniquet placed on proximal thigh.  Right leg sterilely prepped and  draped in the usual manner.  Timeout called, verifying correct patient, correct site.  We exsanguinated the limb with an Esmarch bandage and inflating the tourniquet to 300 mmHg.  We  placed the knee in flexion performed a longitudinal midline incision  with a 10 blade scalpel.  Dissection down through subcutaneous tissues.  We used a fresh 10 blade for the medial parapatellar arthrotomy dividing lateral patellofemoral ligaments everting the patella and exposing the distal femur.  The patient had a  large distal femoral condylar defect, which extended posteriorly on the lateral femoral condyle.  The medial side was completely intact and there was no cartilage on the distal femur.  We went ahead and used a step cut drill to enter the distal femur and  placed our intramedullary guide, resecting initially 9 mm off the distal femur as the patient had no flexion contracture set on 5 degrees of valgus.  We went ahead and did our anterior, posterior and chamfer cuts with the 4-in-1 block, removing ACL and  PCL meniscal tissues.  We did deliver a couple of large loose bodies, likely from the distal lateral femoral condyle out of the posterior aspect of the joint and also the lateral gutter.  Once we had our femoral cuts done, we removed ACL and PCL meniscal  tissue subluxing the tibia anteriorly.  We then performed our tibial cut 90 degrees perpendicular to the long axis of tibia with minimal posterior slope, resecting 2 mm off the medial side initially.  We then used a lamina spreader and removed excess  posterior femoral condyle osteophytes.  We got very little on the lateral side, but we were able to get a little bit off the medial side.  We then went ahead and checked our gaps, which were just barely 5 mm in flexion, but I could not get the 5 mm  spacer in  extension.  We went ahead and made a decision to resect more distal femur, which should give [place] better bone support on the lateral femoral condyle, so we took 2 more millimeters off using the angel wing and the distal femoral guide and  resetting our jig and making that distal cut and then our chamfer cuts were revised as well, so we took 2  more millimeters off the distal femur.  It did give [place] a little bit better bony support laterally.  We still were an air ball with the chamfer on  the lateral side, but had good bone support on the anterolateral chamfer cut, but just not the posterolateral chamfer cut. With those cuts performed, we went back to check our gaps.  We were still a little tight in extension and also flexion was still  tight, so we went ahead and resected 2 more millimeters off the tibia utilizing the external jig and resetting using the same pins we had before with 2 more millimeters off we did have symmetric gaps at 5 mm.  We completed our tibial preparation for a  size 5 tibia with modular drill and keel punch with good bony support everywhere underneath the tibia.  We then went to the femur and did our box cut guide for the 5 narrow right femur and then impacted the trial femur in place, we drilled our lug holes,  placed a 5 mm spacer and reduced the knee.  We were pleased with our extension and flexion stability.  We went ahead and resurfaced the patella, it was very thin only 20 mm thick, so we went ahead and used the shim and only resected 7 mm off, sized it  for size 35 and drilled our lug holes.  We then placed our 35 trial patella.  We ranged the knee and had excellent patellar tracking, no-touch technique.  We removed all trial components, irrigated it thoroughly, vacuum mixed high viscosity cement and  dried the bone well.  We cemented the components all in one step.  The tibia, the femur and the patella all together with a 5 mm spacer in place, the knee in extension, allowing the cement to harden.  We then removed excess cement with quarter-inch  curved osteotome inspected throughout the knee joint.  We had previously injected the posterior capsule with combination of Marcaine, Exparel and saline.  We then injected the anterior capsule with a combination of Marcaine, Exparel and saline and  selected a real size 5, 5  mm poly and placed on the tibial tray and reduced the knee.  We had nice little pop as that medial condyle reduced.  Excellent stability both in flexion and extension and normal patellar tracking.  We irrigated thoroughly and  then we went ahead and closed the parapatellar arthrotomy with #1 Vicryl suture, followed by 2-0 Vicryl for subcutaneous closure and 4-0 Monocryl for skin.  Steri-Strips applied followed by sterile dressing.  The patient tolerated surgery well.   PUS D: 07/11/2022 10:47:35 am T: 07/11/2022 3:58:00 pm  JOB: [id number]/ [id number]

## 2022-07-11 NOTE — TOC Transition Note (Signed)
Transition of Care [facility]) - CM/SW Discharge Note   Patient Details  Name: Stephanie Daniel MRN: [id number] Date of Birth: 1951-07-14  Transition of [facility]) CM/SW Contact:  Lennart Pall, LCSW Phone Number: 07/11/2022, 2:07 PM   Clinical Narrative:    Met with pt who confirms she has all needed DME at home.  Has OPPT already set up with [facility].  No TOC needs.   Final next level of care: OP Rehab Barriers to Discharge: No Barriers Identified   Patient Goals and CMS Choice Patient states their goals for this hospitalization and ongoing recovery are:: return home      Discharge Placement                       Discharge Plan and Services                DME Arranged: N/A                    Social Determinants of Health (SDOH) Interventions     Readmission Risk Interventions     No data to display

## 2022-07-11 NOTE — Brief Op Note (Signed)
07/11/2022  10:38 AM  PATIENT:  Stephanie Daniel  71 y.o. female  PRE-OPERATIVE DIAGNOSIS:  Arthritis of right knee end stage  POST-OPERATIVE DIAGNOSIS:  Arthritis of right knee end stage  PROCEDURE:  Procedure(s): TOTAL KNEE ARTHROPLASTY (Right) DePuy Attune  SURGEON:  Surgeon(s) and Role:    Netta Cedars, MD - Primary  PHYSICIAN ASSISTANT:   ASSISTANTS: [place] Bruns, PA-C   ANESTHESIA:   regional and spinal  EBL:  minimal   BLOOD ADMINISTERED:none  DRAINS: none   LOCAL MEDICATIONS USED:  MARCAINE     SPECIMEN:  No Specimen  DISPOSITION OF SPECIMEN:  N/A  COUNTS:  YES  TOURNIQUET:   Total Tourniquet Time Documented: Thigh (Right) - 96 minutes Total: Thigh (Right) - 96 minutes   DICTATION: .Other Dictation: Dictation Number [id number]  PLAN OF CARE: Admit for overnight observation  PATIENT DISPOSITION:  PACU - hemodynamically stable.   Delay start of Pharmacological VTE agent (>24hrs) due to surgical blood loss or risk of bleeding: no

## 2022-07-11 NOTE — Progress Notes (Signed)
Orthopedic Tech Progress Note Patient Details:  Stephanie Daniel [birth year] [id number]  Ortho Devices Type of Ortho Device: Bone foam zero knee Ortho Device/Splint Location: Right knee Ortho Device/Splint Interventions: Application   Post Interventions Patient Tolerated: Well  [name] [name] 07/11/2022, 11:24 AM

## 2022-07-11 NOTE — Anesthesia Procedure Notes (Signed)
Anesthesia Regional Block: Adductor canal block   Pre-Anesthetic Checklist: , timeout performed,  Correct Patient, Correct Site, Correct Laterality,  Correct Procedure,, site marked,  Risks and benefits discussed,  Surgical consent,  Pre-op evaluation,  At surgeon's request and post-op pain management  Laterality: Right  Prep: chloraprep       Needles:  Injection technique: Single-shot  Needle Type: Echogenic Stimulator Needle     Needle Length: 9cm  Needle Gauge: 21     Additional Needles:   Procedures:,,,, ultrasound used (permanent image in chart),,    Narrative:  Start time: 07/11/2022 7:45 AM End time: 07/11/2022 7:55 AM Injection made incrementally with aspirations every 5 mL.  Performed by: Personally  Anesthesiologist: Murvin Natal, MD  Additional Notes: Functioning IV was confirmed and monitors were applied. A time-out was performed. Hand hygiene and sterile gloves were used. The thigh was placed in a frog-leg position and prepped in a sterile fashion. A 70m 21ga Arrow echogenic stimulator needle was placed using ultrasound guidance.  Negative aspiration and negative test dose prior to incremental administration of local anesthetic. The patient tolerated the procedure well.

## 2022-07-11 NOTE — Care Plan (Signed)
Ortho Bundle Case Management Note  Patient Details  Name: Stephanie Daniel MRN: [id number] Date of Birth: 1951/04/06                  R TKA on 07-11-22 DCP: Home with granddtr DME: No needs, has a RW PT: EO on 07-14-22.   DME Arranged:  N/A DME Agency:     HH Arranged:    HH Agency:     Additional Comments: Please contact me with any questions of if this plan should need to change.  Marianne Sofia, RN,CCM [facility]  [phone] 07/11/2022, 10:48 AM

## 2022-07-11 NOTE — Anesthesia Procedure Notes (Signed)
Procedure Name: MAC Date/Time: 07/11/2022 8:40 AM  Performed by: Claudia Desanctis, CRNAPre-anesthesia Checklist: Patient identified, Emergency Drugs available, Suction available and Patient being monitored Patient Re-evaluated:Patient Re-evaluated prior to induction Oxygen Delivery Method: Simple face mask

## 2022-07-11 NOTE — Evaluation (Signed)
Physical Therapy Evaluation Patient Details Name: Stephanie Daniel MRN: [id number] DOB: May 04, 1951 Today's Date: 07/11/2022  History of Present Illness  71 y.o. female admitted 07/11/22 for R TKA. PMH: L TKA 2018, CAD, HTN, tachycardia (per family)  Clinical Impression  Pt is s/p TKA resulting in the deficits listed below (see PT Problem List). Pt reports that she was transferring to an electric scooter at baseline, and that she hasn't walked in ~2 months. On evaluation, pt transferred to a bedside commode, then to a recliner. Activity was limited by urinary incontinence upon standing, and by nausea and vomiting. Nausea medication requested.  Pt will benefit from skilled PT to increase their independence and safety with mobility to allow discharge to the venue listed below.         Recommendations for follow up therapy are one component of a multi-disciplinary discharge planning process, led by the attending physician.  Recommendations may be updated based on patient status, additional functional criteria and insurance authorization.  Follow Up Recommendations Follow physician's recommendations for discharge plan and follow up therapies      Assistance Recommended at Discharge Intermittent Supervision/Assistance  Patient can return home with the following  A little help with walking and/or transfers;A little help with bathing/dressing/bathroom;Assistance with cooking/housework;Assist for transportation;Help with stairs or ramp for entrance    Equipment Recommendations None recommended by PT  Recommendations for Other Services       Functional Status Assessment Patient has had a recent decline in their functional status and demonstrates the ability to make significant improvements in function in a reasonable and predictable amount of time.     Precautions / Restrictions Precautions Precautions: Fall;Knee Restrictions Weight Bearing Restrictions: No Other Position/Activity Restrictions:  WBAT      Mobility  Bed Mobility Overal bed mobility: Needs Assistance Bed Mobility: Supine to Sit     Supine to sit: Min assist     General bed mobility comments: assist to raise trunk    Transfers Overall transfer level: Needs assistance Equipment used: Rolling walker (2 wheels) Transfers: Sit to/from Stand, Bed to chair/wheelchair/BSC Sit to Stand: +2 safety/equipment, +2 physical assistance, Min assist   Step pivot transfers: Min assist, +2 safety/equipment, +2 physical assistance       General transfer comment: VCs hand placement, min A to power up and to steady, pt limited by nausea and vomiting, incontinent of urine upon standing so pivoted to bedside commode then to recliner    Ambulation/Gait                  Stairs            Wheelchair Mobility    Modified Rankin (Stroke Patients Only)       Balance Overall balance assessment: Needs assistance Sitting-balance support: Feet supported Sitting balance-Leahy Scale: Fair     Standing balance support: Bilateral upper extremity supported Standing balance-Leahy Scale: Poor                               Pertinent Vitals/Pain Pain Assessment Pain Assessment: 0-10 Pain Score: 6  Pain Location: R knee Pain Descriptors / Indicators: Grimacing Pain Intervention(s): Limited activity within patient's tolerance, Monitored during session, Premedicated before session, Ice applied    Home Living Family/patient expects to be discharged to:: Private residence Living Arrangements: Alone Available Help at Discharge: Family;Available 24 hours/day Type of Home: House Home Access: Level entry       Home  Layout: One level Home Equipment: Conservation officer, nature (2 wheels);Electric scooter Additional Comments: granddaughter to stay with pt    Prior Function Prior Level of Function : Needs assist             Mobility Comments: was doing transfers to scooter only PTA, hasn't walked in ~2  months       Hand Dominance        Extremity/Trunk Assessment   Upper Extremity Assessment Upper Extremity Assessment: Overall WFL for tasks assessed    Lower Extremity Assessment Lower Extremity Assessment: RLE deficits/detail RLE Deficits / Details: knee ext +2/5 RLE Sensation: WNL RLE Coordination: WNL    Cervical / Trunk Assessment Cervical / Trunk Assessment: Normal  Communication   Communication: HOH  Cognition Arousal/Alertness: Awake/alert Behavior During Therapy: WFL for tasks assessed/performed Overall Cognitive Status: Within Functional Limits for tasks assessed                                          General Comments      Exercises Total Joint Exercises Ankle Circles/Pumps: AROM, Both, 10 reps, Supine   Assessment/Plan    PT Assessment Patient needs continued PT services  PT Problem List Decreased strength;Decreased range of motion;Decreased mobility;Pain;Decreased activity tolerance       PT Treatment Interventions Gait training;DME instruction;Therapeutic activities;Therapeutic exercise;Functional mobility training;Patient/family education    PT Goals (Current goals can be found in the Care Plan section)  Acute Rehab PT Goals Patient Stated Goal: to walk PT Goal Formulation: With patient/family Time For Goal Achievement: 07/18/22 Potential to Achieve Goals: Good    Frequency 7X/week     Co-evaluation               AM-PAC PT "6 Clicks" Mobility  Outcome Measure Help needed turning from your back to your side while in a flat bed without using bedrails?: A Little Help needed moving from lying on your back to sitting on the side of a flat bed without using bedrails?: A Little Help needed moving to and from a bed to a chair (including a wheelchair)?: A Lot Help needed standing up from a chair using your arms (e.g., wheelchair or bedside chair)?: A Little Help needed to walk in hospital room?: Total Help needed  climbing 3-5 steps with a railing? : Total 6 Click Score: 13    End of Session Equipment Utilized During Treatment: Gait belt Activity Tolerance: Treatment limited secondary to medical complications (Comment) (nausea and vomiting) Patient left: in chair;with chair alarm set;with call bell/phone within reach;with family/visitor present Nurse Communication: Mobility status (request for nausea meds, pt incontinent upon standing) PT Visit Diagnosis: Difficulty in walking, not elsewhere classified (R26.2);Muscle weakness (generalized) (M62.81);Pain;Unsteadiness on feet (R26.81) Pain - Right/Left: Right Pain - part of body: Knee    Time: 1884-1660 PT Time Calculation (min) (ACUTE ONLY): 24 min   Charges:   PT Evaluation $PT Eval Moderate Complexity: 1 Mod PT Treatments $Therapeutic Activity: 8-22 mins       Blondell Reveal Kistler PT 07/11/2022  Acute Rehabilitation Services  Office [phone]

## 2022-07-11 NOTE — Discharge Instructions (Signed)
Ice to the knee constantly.  Keep the incision covered and clean and dry for one week, then ok to get it wet in the shower.  Do exercise as instructed every hour, please to prevent stiffness.    DO NOT prop anything under the knee, it will make your knee stiff.  Prop under the ankle to encourage your knee to go straight.   Use the walker while you are up and around for balance.  Wear your support stockings 24/7 to prevent blood clots and take baby aspirin twice daily for 30 days also to prevent blood clots  Follow up with Dr Xylia Scherger in two weeks in the office, call [phone] for appt   INSTRUCTIONS AFTER JOINT REPLACEMENT   Remove items at home which could result in a fall. This includes throw rugs or furniture in walking pathways ICE to the affected joint every three hours while awake for 30 minutes at a time, for at least the first 3-5 days, and then as needed for pain and swelling.  Continue to use ice for pain and swelling. You may notice swelling that will progress down to the foot and ankle.  This is normal after surgery.  Elevate your leg when you are not up walking on it.   Continue to use the breathing machine you got in the hospital (incentive spirometer) which will help keep your temperature down.  It is common for your temperature to cycle up and down following surgery, especially at night when you are not up moving around and exerting yourself.  The breathing machine keeps your lungs expanded and your temperature down.   DIET:  As you were doing prior to hospitalization, we recommend a well-balanced diet.  DRESSING / WOUND CARE / SHOWERING  You may change your dressing 3-5 days after surgery.  Then change the dressing every day with sterile gauze.  Please use good hand washing techniques before changing the dressing.  Do not use any lotions or creams on the incision until instructed by your surgeon.  ACTIVITY  Increase activity slowly as tolerated, but follow the weight  bearing instructions below.   No driving for 6 weeks or until further direction given by your physician.  You cannot drive while taking narcotics.  No lifting or carrying greater than 10 lbs. until further directed by your surgeon. Avoid periods of inactivity such as sitting longer than an hour when not asleep. This helps prevent blood clots.  You may return to work once you are authorized by your doctor.     WEIGHT BEARING   Weight bearing as tolerated with assist device (walker, cane, etc) as directed, use it as long as suggested by your surgeon or therapist, typically at least 4-6 weeks.   EXERCISES  Results after joint replacement surgery are often greatly improved when you follow the exercise, range of motion and muscle strengthening exercises prescribed by your doctor. Safety measures are also important to protect the joint from further injury. Any time any of these exercises cause you to have increased pain or swelling, decrease what you are doing until you are comfortable again and then slowly increase them. If you have problems or questions, call your caregiver or physical therapist for advice.   Rehabilitation is important following a joint replacement. After just a few days of immobilization, the muscles of the leg can become weakened and shrink (atrophy).  These exercises are designed to build up the tone and strength of the thigh and leg muscles and to   improve motion. Often times heat used for twenty to thirty minutes before working out will loosen up your tissues and help with improving the range of motion but do not use heat for the first two weeks following surgery (sometimes heat can increase post-operative swelling).   These exercises can be done on a training (exercise) mat, on the floor, on a table or on a bed. Use whatever works the best and is most comfortable for you.    Use music or television while you are exercising so that the exercises are a pleasant break in your day.  This will make your life better with the exercises acting as a break in your routine that you can look forward to.   Perform all exercises about fifteen times, three times per day or as directed.  You should exercise both the operative leg and the other leg as well.  Exercises include:   Quad Sets - Tighten up the muscle on the front of the thigh (Quad) and hold for 5-10 seconds.   Straight Leg Raises - With your knee straight (if you were given a brace, keep it on), lift the leg to 60 degrees, hold for 3 seconds, and slowly lower the leg.  Perform this exercise against resistance later as your leg gets stronger.  Leg Slides: Lying on your back, slowly slide your foot toward your buttocks, bending your knee up off the floor (only go as far as is comfortable). Then slowly slide your foot back down until your leg is flat on the floor again.  Angel Wings: Lying on your back spread your legs to the side as far apart as you can without causing discomfort.  Hamstring Strength:  Lying on your back, push your heel against the floor with your leg straight by tightening up the muscles of your buttocks.  Repeat, but this time bend your knee to a comfortable angle, and push your heel against the floor.  You may put a pillow under the heel to make it more comfortable if necessary.   A rehabilitation program following joint replacement surgery can speed recovery and prevent re-injury in the future due to weakened muscles. Contact your doctor or a physical therapist for more information on knee rehabilitation.    CONSTIPATION  Constipation is defined medically as fewer than three stools per week and severe constipation as less than one stool per week.  Even if you have a regular bowel pattern at home, your normal regimen is likely to be disrupted due to multiple reasons following surgery.  Combination of anesthesia, postoperative narcotics, change in appetite and fluid intake all can affect your bowels.   YOU MUST  use at least one of the following options; they are listed in order of increasing strength to get the job done.  They are all available over the counter, and you may need to use some, POSSIBLY even all of these options:    Drink plenty of fluids (prune juice may be helpful) and high fiber foods Colace 100 mg by mouth twice a day  Senokot for constipation as directed and as needed Dulcolax (bisacodyl), take with full glass of water  Miralax (polyethylene glycol) once or twice a day as needed.  If you have tried all these things and are unable to have a bowel movement in the first 3-4 days after surgery call either your surgeon or your primary doctor.    If you experience loose stools or diarrhea, hold the medications until you stool forms back   up.  If your symptoms do not get better within 1 week or if they get worse, check with your doctor.  If you experience "the worst abdominal pain ever" or develop nausea or vomiting, please contact the office immediately for further recommendations for treatment.   ITCHING:  If you experience itching with your medications, try taking only a single pain pill, or even half a pain pill at a time.  You can also use Benadryl over the counter for itching or also to help with sleep.   TED HOSE STOCKINGS:  Use stockings on both legs until for at least 2 weeks or as directed by physician office. They may be removed at night for sleeping.  MEDICATIONS:  See your medication summary on the "After Visit Summary" that nursing will review with you.  You may have some home medications which will be placed on hold until you complete the course of blood thinner medication.  It is important for you to complete the blood thinner medication as prescribed.  PRECAUTIONS:  If you experience chest pain or shortness of breath - call 911 immediately for transfer to the hospital emergency department.   If you develop a fever greater that 101 F, purulent drainage from wound, increased  redness or drainage from wound, foul odor from the wound/dressing, or calf pain - CONTACT YOUR SURGEON.                                                   FOLLOW-UP APPOINTMENTS:  If you do not already have a post-op appointment, please call the office for an appointment to be seen by your surgeon.  Guidelines for how soon to be seen are listed in your "After Visit Summary", but are typically between 1-4 weeks after surgery.  OTHER INSTRUCTIONS:   Knee Replacement:  Do not place pillow under knee, focus on keeping the knee straight while resting. CPM instructions: 0-90 degrees, 2 hours in the morning, 2 hours in the afternoon, and 2 hours in the evening. Place foam block, curve side up under heel at all times except when in CPM or when walking.  DO NOT modify, tear, cut, or change the foam block in any way.  POST-OPERATIVE OPIOID TAPER INSTRUCTIONS: It is important to wean off of your opioid medication as soon as possible. If you do not need pain medication after your surgery it is ok to stop day one. Opioids include: Codeine, Hydrocodone(Norco, Vicodin), Oxycodone(Percocet, oxycontin) and hydromorphone amongst others.  Long term and even short term use of opiods can cause: Increased pain response Dependence Constipation Depression Respiratory depression And more.  Withdrawal symptoms can include Flu like symptoms Nausea, vomiting And more Techniques to manage these symptoms Hydrate well Eat regular healthy meals Stay active Use relaxation techniques(deep breathing, meditating, yoga) Do Not substitute Alcohol to help with tapering If you have been on opioids for less than two weeks and do not have pain than it is ok to stop all together.  Plan to wean off of opioids This plan should start within one week post op of your joint replacement. Maintain the same interval or time between taking each dose and first decrease the dose.  Cut the total daily intake of opioids by one tablet each  day Next start to increase the time between doses. The last dose that should be eliminated is   the evening dose.   MAKE SURE YOU:  Understand these instructions.  Get help right away if you are not doing well or get worse.    Thank you for letting us be a part of your medical care team.  It is a privilege we respect greatly.  We hope these instructions will help you stay on track for a fast and full recovery!      

## 2022-07-11 NOTE — Interval H&P Note (Signed)
History and Physical Interval Note:  07/11/2022 7:26 AM  Stephanie Daniel  has presented today for surgery, with the diagnosis of Arthritis of right knee end stage.  The various methods of treatment have been discussed with the patient and family. After consideration of risks, benefits and other options for treatment, the patient has consented to  Procedure(s): TOTAL KNEE ARTHROPLASTY (Right) as a surgical intervention.  The patient's history has been reviewed, patient examined, no change in status, stable for surgery.  I have reviewed the patient's chart and labs.  Questions were answered to the patient's satisfaction.     [name]

## 2022-07-11 NOTE — Plan of Care (Signed)
  Problem: Education: Goal: Knowledge of the prescribed therapeutic regimen will improve Outcome: Progressing   Problem: Activity: Goal: Ability to avoid complications of mobility impairment will improve Outcome: Progressing   Problem: Clinical Measurements: Goal: Postoperative complications will be avoided or minimized Outcome: Progressing   Problem: Pain Management: Goal: Pain level will decrease with appropriate interventions Outcome: Progressing   Problem: Safety: Goal: Ability to remain free from injury will improve Outcome: Progressing

## 2022-07-11 NOTE — Transfer of Care (Signed)
Immediate Anesthesia Transfer of Care Note  Patient: Stephanie Daniel  Procedure(s) Performed: TOTAL KNEE ARTHROPLASTY (Right: Knee)  Patient Location: PACU  Anesthesia Type:Spinal  Level of Consciousness: awake and patient cooperative  Airway & Oxygen Therapy: Patient Spontanous Breathing and Patient connected to face mask  Post-op Assessment: Report given to RN and Post -op Vital signs reviewed and stable  Post vital signs: Reviewed and stable  Last Vitals:  Vitals Value Taken Time  BP 101/64 07/11/22 1045  Temp    Pulse 71 07/11/22 1047  Resp 26 07/11/22 1047  SpO2 96 % 07/11/22 1047  Vitals shown include unvalidated device data.  Last Pain:  Vitals:   07/11/22 0747  TempSrc: Oral  PainSc: 0-No pain         Complications: No notable events documented.

## 2022-07-11 NOTE — Anesthesia Procedure Notes (Signed)
Spinal  Patient location during procedure: OR Start time: 07/11/2022 8:35 AM End time: 07/11/2022 8:40 AM Reason for block: surgical anesthesia Staffing Performed: anesthesiologist  Anesthesiologist: Murvin Natal, MD Performed by: Murvin Natal, MD Authorized by: Murvin Natal, MD   Preanesthetic Checklist Completed: patient identified, IV checked, risks and benefits discussed, surgical consent, monitors and equipment checked, pre-op evaluation and timeout performed Spinal Block Patient position: sitting Prep: DuraPrep Patient monitoring: cardiac monitor, continuous pulse ox and blood pressure Approach: midline Location: L4-5 Injection technique: single-shot Needle Needle type: Pencan  Needle gauge: 24 G Needle length: 9 cm Assessment Sensory level: T10 Events: CSF return Additional Notes Functioning IV was confirmed and monitors were applied. Sterile prep and drape, including hand hygiene and sterile gloves were used. The patient was positioned and the spine was prepped. The skin was anesthetized with lidocaine.  Free flow of clear CSF was obtained prior to injecting local anesthetic into the CSF.  The spinal needle aspirated freely following injection.  The needle was carefully withdrawn.  The patient tolerated the procedure well.

## 2022-07-11 NOTE — Anesthesia Postprocedure Evaluation (Signed)
Anesthesia Post Note  Patient: Stephanie Daniel  Procedure(s) Performed: TOTAL KNEE ARTHROPLASTY (Right: Knee)     Patient location during evaluation: PACU Anesthesia Type: Regional and Spinal Level of consciousness: awake Pain management: pain level controlled Vital Signs Assessment: post-procedure vital signs reviewed and stable Respiratory status: spontaneous breathing, nonlabored ventilation, respiratory function stable and patient connected to nasal cannula oxygen Cardiovascular status: stable and blood pressure returned to baseline Postop Assessment: no apparent nausea or vomiting Anesthetic complications: no   No notable events documented.  Last Vitals:  Vitals:   07/11/22 1130 07/11/22 1224  BP: 108/82 132/78  Pulse: 68 68  Resp: 19 18  Temp: 36.6 C 36.6 C  SpO2: 96% 98%    Last Pain:  Vitals:   07/11/22 1224  TempSrc: Oral  PainSc: 0-No pain                 Shikira Folino P Dijon Kohlman

## 2022-07-12 DIAGNOSIS — Z87891 Personal history of nicotine dependence: Secondary | ICD-10-CM | POA: Diagnosis not present

## 2022-07-12 DIAGNOSIS — Z96652 Presence of left artificial knee joint: Secondary | ICD-10-CM | POA: Diagnosis not present

## 2022-07-12 DIAGNOSIS — E039 Hypothyroidism, unspecified: Secondary | ICD-10-CM | POA: Diagnosis not present

## 2022-07-12 DIAGNOSIS — I2510 Atherosclerotic heart disease of native coronary artery without angina pectoris: Secondary | ICD-10-CM | POA: Diagnosis not present

## 2022-07-12 DIAGNOSIS — M1711 Unilateral primary osteoarthritis, right knee: Secondary | ICD-10-CM | POA: Diagnosis not present

## 2022-07-12 DIAGNOSIS — I10 Essential (primary) hypertension: Secondary | ICD-10-CM | POA: Diagnosis not present

## 2022-07-12 NOTE — Progress Notes (Signed)
Physical Therapy Treatment Patient Details Name: Stephanie Daniel MRN: [id number] DOB: Jun 16, 1951 Today's Date: 07/12/2022   History of Present Illness 71 y.o. female admitted 07/11/22 for R TKA. PMH: L TKA 2018, CAD, HTN, tachycardia (per family)    PT Comments    POD # 1 am session Assisted OOB to amb in hallway then to bathroom.  Pt moving well. Then returned to room to perform some TE's following HEP handout.  Instructed on proper tech, freq as well as use of ICE.   Will see pt again this afternoon for Family training then D/C to home after.    Recommendations for follow up therapy are one component of a multi-disciplinary discharge planning process, led by the attending physician.  Recommendations may be updated based on patient status, additional functional criteria and insurance authorization.  Follow Up Recommendations  Follow physician's recommendations for discharge plan and follow up therapies     Assistance Recommended at Discharge Intermittent Supervision/Assistance  Patient can return home with the following A little help with walking and/or transfers;A little help with bathing/dressing/bathroom;Assistance with cooking/housework;Assist for transportation;Help with stairs or ramp for entrance   Equipment Recommendations  None recommended by PT    Recommendations for Other Services       Precautions / Restrictions Precautions Precautions: Fall;Knee Precaution Comments: instructed no pillow under knee Restrictions Weight Bearing Restrictions: No Other Position/Activity Restrictions: WBAT     Mobility  Bed Mobility Overal bed mobility: Needs Assistance Bed Mobility: Supine to Sit     Supine to sit: Supervision, Min guard     General bed mobility comments: demonstarted and instructed how to use belt to self assist LE    Transfers Overall transfer level: Needs assistance Equipment used: Rolling walker (2 wheels) Transfers: Sit to/from Stand Sit to Stand:  Supervision, Min guard           General transfer comment: 25% VC's on proper hand placement and safety with turns.    Ambulation/Gait Ambulation/Gait assistance: Supervision, Min guard Gait Distance (Feet): 55 Feet Assistive device: Rolling walker (2 wheels) Gait Pattern/deviations: Step-to pattern, Decreased stance time - right Gait velocity: decreased     General Gait Details: 25% VC's on proper walker to self distance and sequencing, pt tolerated amb a functional distance of 55 feet.   Stairs             Wheelchair Mobility    Modified Rankin (Stroke Patients Only)       Balance                                            Cognition Arousal/Alertness: Awake/alert Behavior During Therapy: WFL for tasks assessed/performed Overall Cognitive Status: Within Functional Limits for tasks assessed                                 General Comments: AxO x 3 very pleasant Lady        Exercises  Total Knee Replacement TE's following HEP handout 10 reps B LE ankle pumps 05 reps towel squeezes 05 reps knee presses 05 reps heel slides  05 reps SAQ's 05 reps SLR's 05 reps ABD Educated on use of gait belt to assist with TE's Followed by ICE     General Comments        Pertinent Vitals/Pain Pain  Assessment Pain Assessment: 0-10 Pain Score: 3  Pain Location: R knee Pain Descriptors / Indicators: Grimacing, Tender, Operative site guarding Pain Intervention(s): Monitored during session, Premedicated before session, Repositioned, Ice applied    Home Living                          Prior Function            PT Goals (current goals can now be found in the care plan section) Progress towards PT goals: Progressing toward goals    Frequency    7X/week      PT Plan Current plan remains appropriate    Co-evaluation              AM-PAC PT "6 Clicks" Mobility   Outcome Measure  Help needed turning from  your back to your side while in a flat bed without using bedrails?: A Little Help needed moving from lying on your back to sitting on the side of a flat bed without using bedrails?: A Little Help needed moving to and from a bed to a chair (including a wheelchair)?: A Little Help needed standing up from a chair using your arms (e.g., wheelchair or bedside chair)?: A Little Help needed to walk in hospital room?: A Little Help needed climbing 3-5 steps with a railing? : A Little 6 Click Score: 18    End of Session Equipment Utilized During Treatment: Gait belt Activity Tolerance: Treatment limited secondary to medical complications (Comment) Patient left: in chair;with chair alarm set;with call bell/phone within reach;with family/visitor present Nurse Communication: Mobility status PT Visit Diagnosis: Difficulty in walking, not elsewhere classified (R26.2);Muscle weakness (generalized) (M62.81);Pain;Unsteadiness on feet (R26.81) Pain - Right/Left: Right Pain - part of body: Knee     Time: 1202-1242 PT Time Calculation (min) (ACUTE ONLY): 40 min  Charges:  $Gait Training: 8-22 mins $Therapeutic Exercise: 8-22 mins $Therapeutic Activity: 8-22 mins                     Rica Koyanagi  PTA Acute  Rehabilitation Services Office M-F          [phone] Weekend pager [phone]

## 2022-07-12 NOTE — Progress Notes (Signed)
Pt stable at time of d/c instructions and education. No needs at this time. Pt dressing remains clean, dry, and intact.

## 2022-07-12 NOTE — Progress Notes (Signed)
Subjective: 1 Day Post-Op Procedure(s) (LRB): TOTAL KNEE ARTHROPLASTY (Right) Patient seen in rounds for Dr. Veverly Fells today Patient reports pain as mild.   Was having some difficulty with nausea last night, Zofran helped   Objective: Vital signs in last 24 hours: Temp:  [97.4 F (36.3 C)-97.9 F (36.6 C)] 97.8 F (36.6 C) (08/26 0557) Pulse Rate:  [64-85] 71 (08/26 0557) Resp:  [16-20] 17 (08/26 0557) BP: (101-163)/(64-96) 140/74 (08/26 0557) SpO2:  [91 %-98 %] 91 % (08/26 0557)  Intake/Output from previous day: 08/25 0701 - 08/26 0700 In: 2232.4 [P.O.:360; I.V.:1572.4; IV Piggyback:300] Out: 25 [Blood:25] Intake/Output this shift: Total I/O In: 329.1 [P.O.:240; I.V.:89.1] Out: -   No results for input(s): "HGB" in the last 72 hours. No results for input(s): "WBC", "RBC", "HCT", "PLT" in the last 72 hours. No results for input(s): "NA", "K", "CL", "CO2", "BUN", "CREATININE", "GLUCOSE", "CALCIUM" in the last 72 hours. No results for input(s): "LABPT", "INR" in the last 72 hours.  Neurologically intact Neurovascular intact Sensation intact distally Intact pulses distally Dorsiflexion/Plantar flexion intact Incision: dressing C/D/I No cellulitis present Compartment soft   Assessment/Plan: 1 Day Post-Op Procedure(s) (LRB): TOTAL KNEE ARTHROPLASTY (Right) Advance diet Up with therapy, if patient does well with therapy today hopefully we can get her home this afternoon All medications have been sent to the pharmacy New dressing was applied to the patient's knee today Patient will follow-up in the office in 10 to 14 days for first postop appointment    Patient's anticipated LOS is less than 2 midnights, meeting these requirements: - Younger than 4 - Lives within 1 hour of care - Has a competent adult at home to recover with post-op recover - NO history of  - Chronic pain requiring opiods  - Diabetes  - Coronary Artery Disease  - Heart failure  - Heart attack  -  Stroke  - DVT/VTE  - Cardiac arrhythmia  - Respiratory Failure/COPD  - Renal failure  - Anemia  - Advanced Liver disease     Nettie Elm [facility] [phone] 07/12/2022, 8:42 AM

## 2022-07-12 NOTE — Plan of Care (Signed)
  Problem: Pain Management: Goal: Pain level will decrease with appropriate interventions Outcome: Progressing   Problem: Elimination: Goal: Will not experience complications related to urinary retention Outcome: Progressing   Problem: Pain Managment: Goal: General experience of comfort will improve Outcome: Progressing

## 2022-07-12 NOTE — Plan of Care (Signed)
  Problem: Clinical Measurements: Goal: Postoperative complications will be avoided or minimized Outcome: Progressing   Problem: Pain Management: Goal: Pain level will decrease with appropriate interventions Outcome: Progressing   Problem: Skin Integrity: Goal: Will show signs of wound healing Outcome: Progressing   Problem: Health Behavior/Discharge Planning: Goal: Ability to manage health-related needs will improve Outcome: Progressing

## 2022-07-12 NOTE — Progress Notes (Signed)
Physical Therapy Treatment Patient Details Name: Stephanie Daniel MRN: [id number] DOB: 05/29/1951 Today's Date: 07/12/2022   History of Present Illness 71 y.o. female admitted 07/11/22 for R TKA. PMH: L TKA 2018, CAD, HTN, tachycardia (per family)    PT Comments    POD # 1 pm session Granddaughter present during session.  Had GrandDaughter "hands on" assist pt out of recliner to amb in hallway as well as Instructed on pt's HEP, use og ICE and activity level.  Addressed all mobility questions, discussed appropriate activity, educated on use of ICE.  Pt ready for D/C to home.   Recommendations for follow up therapy are one component of a multi-disciplinary discharge planning process, led by the attending physician.  Recommendations may be updated based on patient status, additional functional criteria and insurance authorization.  Follow Up Recommendations  Follow physician's recommendations for discharge plan and follow up therapies     Assistance Recommended at Discharge Intermittent Supervision/Assistance  Patient can return home with the following A little help with walking and/or transfers;A little help with bathing/dressing/bathroom;Assistance with cooking/housework;Assist for transportation;Help with stairs or ramp for entrance   Equipment Recommendations  None recommended by PT    Recommendations for Other Services       Precautions / Restrictions Precautions Precautions: Fall;Knee Precaution Comments: instructed no pillow under knee Restrictions Weight Bearing Restrictions: No Other Position/Activity Restrictions: WBAT     Mobility  Bed Mobility     General bed mobility comments: Pt OOB in recliner    Transfers Overall transfer level: Needs assistance Equipment used: Rolling walker (2 wheels) Transfers: Sit to/from Stand Sit to Stand: Supervision, Min guard           General transfer comment: had Granddaughter "hands on" assist pt with Instruction on safe  handling    Ambulation/Gait Ambulation/Gait assistance: Supervision, Min guard Gait Distance (Feet): 55 Feet Assistive device: Rolling walker (2 wheels) Gait Pattern/deviations: Step-to pattern, Decreased stance time - right Gait velocity: decreased     General Gait Details: had GrandDaughter "hands on" assist with amb pt in hallway with instructions on safe handling.   Stairs             Wheelchair Mobility    Modified Rankin (Stroke Patients Only)       Balance                                            Cognition Arousal/Alertness: Awake/alert Behavior During Therapy: WFL for tasks assessed/performed Overall Cognitive Status: Within Functional Limits for tasks assessed                                 General Comments: AxO x 3 very pleasant Lady        Exercises  05 reps all seated TE's    General Comments        Pertinent Vitals/Pain Pain Assessment Pain Assessment: 0-10 Pain Score: 3  Pain Location: R knee Pain Descriptors / Indicators: Grimacing, Tender, Operative site guarding Pain Intervention(s): Monitored during session, Premedicated before session, Repositioned, Ice applied    Home Living                          Prior Function            PT  Goals (current goals can now be found in the care plan section) Progress towards PT goals: Progressing toward goals    Frequency    7X/week      PT Plan Current plan remains appropriate    Co-evaluation              AM-PAC PT "6 Clicks" Mobility   Outcome Measure  Help needed turning from your back to your side while in a flat bed without using bedrails?: A Little Help needed moving from lying on your back to sitting on the side of a flat bed without using bedrails?: A Little Help needed moving to and from a bed to a chair (including a wheelchair)?: A Little Help needed standing up from a chair using your arms (e.g., wheelchair or bedside  chair)?: A Little Help needed to walk in hospital room?: A Little Help needed climbing 3-5 steps with a railing? : A Little 6 Click Score: 18    End of Session Equipment Utilized During Treatment: Gait belt Activity Tolerance: Treatment limited secondary to medical complications (Comment) Patient left: in chair;with chair alarm set;with call bell/phone within reach;with family/visitor present Nurse Communication: Mobility status PT Visit Diagnosis: Difficulty in walking, not elsewhere classified (R26.2);Muscle weakness (generalized) (M62.81);Pain;Unsteadiness on feet (R26.81) Pain - Right/Left: Right Pain - part of body: Knee     Time: 1434-1500 PT Time Calculation (min) (ACUTE ONLY): 26 min  Charges:  $Gait Training: 8-22 mins $Therapeutic Exercise: 8-22 mins                      Rica Koyanagi  PTA [place] Office M-F          [phone] Weekend pager [phone]

## 2022-07-12 NOTE — Plan of Care (Signed)
Pt to d/c home with family. Pt dressing clean, dry, and intact.

## 2022-07-14 ENCOUNTER — Encounter (HOSPITAL_COMMUNITY): Payer: Self-pay | Admitting: Orthopedic Surgery

## 2022-07-17 ENCOUNTER — Inpatient Hospital Stay (HOSPITAL_COMMUNITY)
Admission: EM | Admit: 2022-07-17 | Discharge: 2022-07-19 | DRG: 312 | Disposition: A | Discharge status: Home / Self Care | Payer: Medicare Other | Attending: Internal Medicine | Admitting: Internal Medicine

## 2022-07-17 ENCOUNTER — Encounter (HOSPITAL_COMMUNITY): Payer: Self-pay

## 2022-07-17 ENCOUNTER — Emergency Department (HOSPITAL_COMMUNITY): Payer: Medicare Other

## 2022-07-17 ENCOUNTER — Other Ambulatory Visit: Payer: Self-pay

## 2022-07-17 DIAGNOSIS — I10 Essential (primary) hypertension: Secondary | ICD-10-CM | POA: Diagnosis present

## 2022-07-17 DIAGNOSIS — R000 Tachycardia, unspecified: Principal | ICD-10-CM

## 2022-07-17 DIAGNOSIS — Z79899 Other long term (current) drug therapy: Secondary | ICD-10-CM

## 2022-07-17 DIAGNOSIS — I248 Other forms of acute ischemic heart disease: Secondary | ICD-10-CM | POA: Diagnosis not present

## 2022-07-17 DIAGNOSIS — F39 Unspecified mood [affective] disorder: Secondary | ICD-10-CM | POA: Diagnosis present

## 2022-07-17 DIAGNOSIS — N1831 Chronic kidney disease, stage 3a: Secondary | ICD-10-CM | POA: Diagnosis present

## 2022-07-17 DIAGNOSIS — E039 Hypothyroidism, unspecified: Secondary | ICD-10-CM | POA: Diagnosis present

## 2022-07-17 DIAGNOSIS — R42 Dizziness and giddiness: Secondary | ICD-10-CM | POA: Diagnosis not present

## 2022-07-17 DIAGNOSIS — R778 Other specified abnormalities of plasma proteins: Secondary | ICD-10-CM

## 2022-07-17 DIAGNOSIS — I130 Hypertensive heart and chronic kidney disease with heart failure and stage 1 through stage 4 chronic kidney disease, or unspecified chronic kidney disease: Secondary | ICD-10-CM | POA: Diagnosis present

## 2022-07-17 DIAGNOSIS — I5032 Chronic diastolic (congestive) heart failure: Secondary | ICD-10-CM | POA: Diagnosis not present

## 2022-07-17 DIAGNOSIS — E860 Dehydration: Secondary | ICD-10-CM | POA: Diagnosis not present

## 2022-07-17 DIAGNOSIS — I471 Supraventricular tachycardia, unspecified: Secondary | ICD-10-CM | POA: Diagnosis present

## 2022-07-17 DIAGNOSIS — Z90710 Acquired absence of both cervix and uterus: Secondary | ICD-10-CM

## 2022-07-17 DIAGNOSIS — Z888 Allergy status to other drugs, medicaments and biological substances status: Secondary | ICD-10-CM

## 2022-07-17 DIAGNOSIS — K219 Gastro-esophageal reflux disease without esophagitis: Secondary | ICD-10-CM | POA: Diagnosis present

## 2022-07-17 DIAGNOSIS — Z96653 Presence of artificial knee joint, bilateral: Secondary | ICD-10-CM | POA: Diagnosis present

## 2022-07-17 DIAGNOSIS — Z9049 Acquired absence of other specified parts of digestive tract: Secondary | ICD-10-CM | POA: Diagnosis not present

## 2022-07-17 DIAGNOSIS — Z96651 Presence of right artificial knee joint: Secondary | ICD-10-CM | POA: Diagnosis not present

## 2022-07-17 DIAGNOSIS — Z87891 Personal history of nicotine dependence: Secondary | ICD-10-CM | POA: Diagnosis not present

## 2022-07-17 DIAGNOSIS — R531 Weakness: Secondary | ICD-10-CM | POA: Diagnosis not present

## 2022-07-17 DIAGNOSIS — Z79890 Hormone replacement therapy (postmenopausal): Secondary | ICD-10-CM

## 2022-07-17 DIAGNOSIS — R0902 Hypoxemia: Secondary | ICD-10-CM | POA: Diagnosis not present

## 2022-07-17 DIAGNOSIS — E876 Hypokalemia: Secondary | ICD-10-CM | POA: Diagnosis not present

## 2022-07-17 DIAGNOSIS — Z66 Do not resuscitate: Secondary | ICD-10-CM | POA: Diagnosis present

## 2022-07-17 DIAGNOSIS — E785 Hyperlipidemia, unspecified: Secondary | ICD-10-CM | POA: Diagnosis present

## 2022-07-17 DIAGNOSIS — Z87442 Personal history of urinary calculi: Secondary | ICD-10-CM

## 2022-07-17 DIAGNOSIS — R55 Syncope and collapse: Secondary | ICD-10-CM | POA: Diagnosis not present

## 2022-07-17 DIAGNOSIS — I2510 Atherosclerotic heart disease of native coronary artery without angina pectoris: Secondary | ICD-10-CM | POA: Diagnosis present

## 2022-07-17 DIAGNOSIS — Z8249 Family history of ischemic heart disease and other diseases of the circulatory system: Secondary | ICD-10-CM

## 2022-07-17 DIAGNOSIS — E038 Other specified hypothyroidism: Secondary | ICD-10-CM | POA: Diagnosis not present

## 2022-07-17 LAB — CBC
HCT: 38.5 % (ref 36.0–46.0)
Hemoglobin: 13.2 g/dL (ref 12.0–15.0)
MCH: 32.2 pg (ref 26.0–34.0)
MCHC: 34.3 g/dL (ref 30.0–36.0)
MCV: 93.9 fL (ref 80.0–100.0)
Platelets: 311 10*3/uL (ref 150–400)
RBC: 4.1 MIL/uL (ref 3.87–5.11)
RDW: 14.2 % (ref 11.5–15.5)
WBC: 9.3 10*3/uL (ref 4.0–10.5)
nRBC: 0 % (ref 0.0–0.2)

## 2022-07-17 LAB — COMPREHENSIVE METABOLIC PANEL
ALT: 13 U/L (ref 0–44)
AST: 22 U/L (ref 15–41)
Albumin: 3 g/dL — ABNORMAL LOW (ref 3.5–5.0)
Alkaline Phosphatase: 62 U/L (ref 38–126)
Anion gap: 11 (ref 5–15)
BUN: 13 mg/dL (ref 8–23)
CO2: 23 mmol/L (ref 22–32)
Calcium: 8.8 mg/dL — ABNORMAL LOW (ref 8.9–10.3)
Chloride: 102 mmol/L (ref 98–111)
Creatinine, Ser: 1.35 mg/dL — ABNORMAL HIGH (ref 0.44–1.00)
GFR, Estimated: 41 mL/min — ABNORMAL LOW (ref >60)
Glucose, Bld: 83 mg/dL (ref 70–99)
Potassium: 4.2 mmol/L (ref 3.5–5.1)
Sodium: 136 mmol/L (ref 135–145)
Total Bilirubin: 0.8 mg/dL (ref 0.3–1.2)
Total Protein: 5.8 g/dL — ABNORMAL LOW (ref 6.5–8.1)

## 2022-07-17 LAB — TROPONIN I (HIGH SENSITIVITY)
Troponin I (High Sensitivity): 62 ng/L — ABNORMAL HIGH (ref <18)
Troponin I (High Sensitivity): 56 ng/L — ABNORMAL HIGH (ref <18)

## 2022-07-17 MED ORDER — ONDANSETRON HCL 4 MG/2ML IJ SOLN
4.0000 mg | Freq: Four times a day (QID) | INTRAMUSCULAR | Status: DC | PRN
  Administered 2022-07-19: 4 mg via INTRAVENOUS
  Filled 2022-07-17: qty 2

## 2022-07-17 MED ORDER — GABAPENTIN 300 MG PO CAPS
300.0000 mg | ORAL_CAPSULE | Freq: Every day | ORAL | Status: DC
  Administered 2022-07-17 – 2022-07-18 (×2): 300 mg via ORAL
  Filled 2022-07-17 (×2): qty 1

## 2022-07-17 MED ORDER — ACYCLOVIR 400 MG PO TABS
400.0000 mg | ORAL_TABLET | Freq: Two times a day (BID) | ORAL | Status: DC
  Administered 2022-07-17 – 2022-07-19 (×4): 400 mg via ORAL
  Filled 2022-07-17 (×6): qty 1

## 2022-07-17 MED ORDER — ACETAMINOPHEN 650 MG RE SUPP: 650.0000 mg | Freq: Four times a day (QID) | RECTAL | Status: DC | PRN

## 2022-07-17 MED ORDER — DOCUSATE SODIUM 100 MG PO CAPS: 100.0000 mg | ORAL_CAPSULE | Freq: Two times a day (BID) | ORAL | Status: DC

## 2022-07-17 MED ORDER — SODIUM CHLORIDE 0.9% FLUSH
3.0000 mL | Freq: Two times a day (BID) | INTRAVENOUS | Status: DC
  Administered 2022-07-17 – 2022-07-19 (×5): 3 mL via INTRAVENOUS

## 2022-07-17 MED ORDER — ONDANSETRON HCL 4 MG PO TABS
4.0000 mg | ORAL_TABLET | Freq: Four times a day (QID) | ORAL | Status: DC | PRN
  Administered 2022-07-17 – 2022-07-18 (×2): 4 mg via ORAL
  Filled 2022-07-17 (×3): qty 1

## 2022-07-17 MED ORDER — HYDRALAZINE HCL 20 MG/ML IJ SOLN: 5.0000 mg | INTRAMUSCULAR | Status: DC | PRN

## 2022-07-17 MED ORDER — LACTATED RINGERS IV SOLN: INTRAVENOUS | Status: DC

## 2022-07-17 MED ORDER — POLYETHYLENE GLYCOL 3350 17 G PO PACK: 17.0000 g | PACK | Freq: Every day | ORAL | Status: DC | PRN

## 2022-07-17 MED ORDER — LISINOPRIL 20 MG PO TABS
20.0000 mg | ORAL_TABLET | Freq: Every day | ORAL | Status: DC
  Administered 2022-07-18 – 2022-07-19 (×2): 20 mg via ORAL
  Filled 2022-07-17 (×2): qty 1

## 2022-07-17 MED ORDER — MORPHINE SULFATE (PF) 2 MG/ML IV SOLN: 2.0000 mg | INTRAVENOUS | Status: DC | PRN

## 2022-07-17 MED ORDER — BISACODYL 5 MG PO TBEC
5.0000 mg | DELAYED_RELEASE_TABLET | Freq: Every day | ORAL | Status: DC | PRN
Start: 2022-07-17 — End: 2022-07-18

## 2022-07-17 MED ORDER — LEVOTHYROXINE SODIUM 25 MCG PO TABS
50.0000 ug | ORAL_TABLET | Freq: Every day | ORAL | Status: DC
Start: 2022-07-18 — End: 2022-07-19
  Administered 2022-07-18 – 2022-07-19 (×2): 50 ug via ORAL
  Filled 2022-07-17 (×2): qty 2

## 2022-07-17 MED ORDER — ENOXAPARIN SODIUM 40 MG/0.4ML IJ SOSY
40.0000 mg | PREFILLED_SYRINGE | INTRAMUSCULAR | Status: DC
  Administered 2022-07-17 – 2022-07-18 (×2): 40 mg via SUBCUTANEOUS
  Filled 2022-07-17 (×2): qty 0.4

## 2022-07-17 MED ORDER — PANTOPRAZOLE SODIUM 40 MG PO TBEC
40.0000 mg | DELAYED_RELEASE_TABLET | Freq: Every day | ORAL | Status: DC
  Administered 2022-07-18 – 2022-07-19 (×2): 40 mg via ORAL
  Filled 2022-07-17 (×2): qty 1

## 2022-07-17 MED ORDER — METOPROLOL TARTRATE 25 MG PO TABS: 25.0000 mg | ORAL_TABLET | Freq: Every day | ORAL | Status: DC | PRN

## 2022-07-17 MED ORDER — ATORVASTATIN CALCIUM 40 MG PO TABS
40.0000 mg | ORAL_TABLET | Freq: Every day | ORAL | Status: DC
  Administered 2022-07-18 – 2022-07-19 (×2): 40 mg via ORAL
  Filled 2022-07-17 (×2): qty 1

## 2022-07-17 MED ORDER — METHOCARBAMOL 500 MG PO TABS
500.0000 mg | ORAL_TABLET | Freq: Three times a day (TID) | ORAL | Status: DC | PRN
Start: 2022-07-17 — End: 2022-07-19

## 2022-07-17 MED ORDER — BUPROPION HCL ER (XL) 150 MG PO TB24
150.0000 mg | ORAL_TABLET | Freq: Every day | ORAL | Status: DC
  Administered 2022-07-18 – 2022-07-19 (×2): 150 mg via ORAL
  Filled 2022-07-17 (×2): qty 1

## 2022-07-17 MED ORDER — OXYCODONE HCL 5 MG PO TABS
5.0000 mg | ORAL_TABLET | ORAL | Status: DC | PRN
  Administered 2022-07-17 – 2022-07-19 (×3): 5 mg via ORAL
  Filled 2022-07-17 (×4): qty 1

## 2022-07-17 MED ORDER — ASPIRIN 81 MG PO TBEC
81.0000 mg | DELAYED_RELEASE_TABLET | Freq: Two times a day (BID) | ORAL | Status: DC
  Administered 2022-07-18 – 2022-07-19 (×3): 81 mg via ORAL
  Filled 2022-07-17 (×3): qty 1

## 2022-07-17 MED ORDER — ACETAMINOPHEN 325 MG PO TABS
650.0000 mg | ORAL_TABLET | Freq: Four times a day (QID) | ORAL | Status: DC | PRN
  Administered 2022-07-17 – 2022-07-18 (×2): 650 mg via ORAL
  Filled 2022-07-17 (×2): qty 2

## 2022-07-17 MED ORDER — LACTATED RINGERS IV BOLUS
1000.0000 mL | Freq: Once | INTRAVENOUS | Status: AC
  Administered 2022-07-17: 1000 mL via INTRAVENOUS

## 2022-07-17 NOTE — Evaluation (Signed)
Occupational Therapy Evaluation Patient Details Name: Stephanie Daniel MRN: [id number] DOB: 03/30/1951 Today's Date: 07/17/2022   History of Present Illness 71 yo female with recent knee replacement who presented with lightheadedness, near syncope, and svt.  PMH includes L TKR, CAD, HTN.   Clinical Impression   Patient admitted for the above diagnosis.  She had recently discharged form [facility] after a R TKR, and was scheduled to begin outpatient PT.  Her granddaughter will be staying with her for a few weeks to assist as needed with ADL and iADL.  She has a scooter at home, but has not used it since the surgery, and her goal is not to use it.  She is actually doing quite well, has the needed DME at home, and presents with no significant acute OT needs.  Recommend home when cleared medically with assist as needed from family.  No post acute OT anticipated.           Recommendations for follow up therapy are one component of a multi-disciplinary discharge planning process, led by the attending physician.  Recommendations may be updated based on patient status, additional functional criteria and insurance authorization.   Follow Up Recommendations  No OT follow up    Assistance Recommended at Discharge Intermittent Supervision/Assistance  Patient can return home with the following Assist for transportation;Assistance with cooking/housework;A little help with bathing/dressing/bathroom    Functional Status Assessment  Patient has had a recent decline in their functional status and demonstrates the ability to make significant improvements in function in a reasonable and predictable amount of time.  Equipment Recommendations  None recommended by OT    Recommendations for Other Services       Precautions / Restrictions Precautions Precautions: Fall;Knee Restrictions Weight Bearing Restrictions: Yes RLE Weight Bearing: Weight bearing as tolerated      Mobility Bed Mobility Overal bed  mobility: Modified Independent             General bed mobility comments: patient able to bring leg off/on the bed.  Increased time.    Transfers Overall transfer level: Needs assistance Equipment used: Rolling walker (2 wheels) Transfers: Sit to/from Stand Sit to Stand: Supervision                  Balance Overall balance assessment: Needs assistance Sitting-balance support: Feet supported Sitting balance-Leahy Scale: Good     Standing balance support: Bilateral upper extremity supported Standing balance-Leahy Scale: Fair                             ADL either performed or assessed with clinical judgement   ADL Overall ADL's : At baseline                                       General ADL Comments: min A for LB ADL from sit/stand level     Vision Patient Visual Report: No change from baseline       Perception Perception Perception: Within Functional Limits   Praxis Praxis Praxis: Intact    Pertinent Vitals/Pain Pain Assessment Pain Assessment: Faces Faces Pain Scale: Hurts a little bit Pain Location: R knee Pain Descriptors / Indicators: Grimacing, Tender, Operative site guarding Pain Intervention(s): Monitored during session     Hand Dominance Right   Extremity/Trunk Assessment Upper Extremity Assessment Upper Extremity Assessment: Overall WFL for tasks  assessed   Lower Extremity Assessment Lower Extremity Assessment: Defer to PT evaluation   Cervical / Trunk Assessment Cervical / Trunk Assessment: Normal   Communication Communication Communication: HOH   Cognition Arousal/Alertness: Awake/alert Behavior During Therapy: WFL for tasks assessed/performed Overall Cognitive Status: Within Functional Limits for tasks assessed                                       General Comments   HR to 121 with mobility.    Exercises     Shoulder Instructions      Home Living Family/patient expects to  be discharged to:: Private residence Living Arrangements: Alone Available Help at Discharge: Family;Available 24 hours/day Type of Home: House Home Access: Level entry     Home Layout: One level     Bathroom Shower/Tub: Teacher, early years/pre: Handicapped height     Home Equipment: Conservation officer, nature (2 wheels);Electric scooter;Hand held shower head;BSC/3in1;Shower seat - built in   Additional Comments: granddaughter to stay with pt, hasn't used scooter since surgery      Prior Functioning/Environment               Mobility Comments: Started walking household distances with RW ADLs Comments: Min A for LB ADL from family.  Granddaughter assisting with home management, meals and community mobility.        OT Problem List: Decreased range of motion;Pain      OT Treatment/Interventions:      OT Goals(Current goals can be found in the care plan section) Acute Rehab OT Goals Patient Stated Goal: Hoping to return home and begin outpatient PT OT Goal Formulation: With patient Time For Goal Achievement: 07/22/22 Potential to Achieve Goals: Good  OT Frequency:      Co-evaluation              AM-PAC OT "6 Clicks" Daily Activity     Outcome Measure Help from another person eating meals?: None Help from another person taking care of personal grooming?: None Help from another person toileting, which includes using toliet, bedpan, or urinal?: A Little Help from another person bathing (including washing, rinsing, drying)?: A Little Help from another person to put on and taking off regular upper body clothing?: None Help from another person to put on and taking off regular lower body clothing?: A Little 6 Click Score: 21   End of Session Equipment Utilized During Treatment: Rolling walker (2 wheels) Nurse Communication: Mobility status  Activity Tolerance: Patient tolerated treatment well Patient left: in chair;with call bell/phone within reach;with  family/visitor present  OT Visit Diagnosis: Pain Pain - Right/Left: Right Pain - part of body: Knee                Time: 7616-0737 OT Time Calculation (min): 21 min Charges:  OT General Charges $OT Visit: 1 Visit OT Evaluation $OT Eval Moderate Complexity: 1 Mod  07/17/2022  RP, OTR/L  Acute Rehabilitation Services  Office:  [phone]   Metta Clines 07/17/2022, 4:48 PM

## 2022-07-17 NOTE — ED Provider Notes (Signed)
[facility] EMERGENCY DEPARTMENT Provider Note   CSN: [id number] Arrival date & time: 07/17/22  1143     History  No chief complaint on file.   Stephanie Daniel is a 71 y.o. female.  HPI 71 yo female with recent knee replacement who presents today with lightheadedness, near syncope, and svt.  Patient reports ongoing pain in right knee with nausea and vomiting she thinks is associated with pain medicien.  Today she became very lightheadded.  She denies chest pain or dyspnea, fever, cough, or chills.   EMS found rhythm c.w. svt and gave adenosine 6 mg with conversion to nsr and patient's symptoms improved.     Home Medications Prior to Admission medications   Medication Sig Start Date End Date Taking? Authorizing Provider  acyclovir (ZOVIRAX) 400 MG tablet Take 400 mg by mouth 2 (two) times daily. 02/23/17   [provider]  aspirin EC 81 MG tablet Take 1 tablet (81 mg total) by mouth 2 (two) times daily. Swallow whole. 07/11/22 08/10/22  Netta Cedars, MD  atorvastatin (LIPITOR) 40 MG tablet Take 1 tablet (40 mg total) by mouth daily. 08/26/21   Geralynn Rile, MD  buPROPion (WELLBUTRIN XL) 150 MG 24 hr tablet Take 150 mg by mouth daily.    [provider]  gabapentin (NEURONTIN) 300 MG capsule Take 300 mg by mouth at bedtime.    [provider]  levothyroxine (SYNTHROID, LEVOTHROID) 50 MCG tablet Take 50 mcg by mouth daily before breakfast. 02/17/17   [provider]  lisinopril (ZESTRIL) 20 MG tablet Take 1 tablet (20 mg total) by mouth daily. 08/26/21   O'NealCassie Freer, MD  meloxicam (MOBIC) 15 MG tablet Take 15 mg by mouth daily as needed for pain. 04/09/22   [provider]  methocarbamol (ROBAXIN) 500 MG tablet Take 1 tablet (500 mg total) by mouth every 8 (eight) hours as needed for muscle spasms. 07/11/22   Netta Cedars, MD  metoprolol tartrate (LOPRESSOR) 25 MG tablet Take 1 tablet (25 mg total) by mouth daily  as needed (for heart rate over 100 bpm). 05/14/22   O'Neal, Cassie Freer, MD  nitroGLYCERIN (NITROSTAT) 0.4 MG SL tablet Place 1 tablet (0.4 mg total) under the tongue every 5 (five) minutes x 3 doses as needed for chest pain. 07/29/21   Margie Billet, NP  omeprazole (PRILOSEC) 40 MG capsule Take 40 mg by mouth daily.    [provider]  ondansetron (ZOFRAN) 4 MG tablet Take 1 tablet (4 mg total) by mouth every 8 (eight) hours as needed for nausea, vomiting or refractory nausea / vomiting. 07/11/22 07/11/23  Netta Cedars, MD  oxyCODONE-acetaminophen (PERCOCET) 5-325 MG tablet Take 1-2 tablets by mouth every 4 (four) hours as needed for severe pain. 07/11/22 07/11/23  Netta Cedars, MD  Potassium 99 MG TABS Take 2 tablets by mouth at bedtime.    [provider]      Allergies    Albuterol    Review of Systems   Review of Systems  Physical Exam Updated Vital Signs BP (!) 147/80   Pulse 86   Temp 98.7 F (37.1 C) (Oral)   Resp 17   Ht 1.626 m ('5\' 4"'$ )   Wt 72.6 kg   SpO2 100%   BMI 27.46 kg/m  Physical Exam Vitals and nursing note reviewed.  Constitutional:      General: She is not in acute distress.    Appearance: Normal appearance.  HENT:  Head: Normocephalic.     Right Ear: External ear normal.     Left Ear: External ear normal.     Nose: Nose normal.     Mouth/Throat:     Mouth: Mucous membranes are dry.  Eyes:     Pupils: Pupils are equal, round, and reactive to light.  Cardiovascular:     Rate and Rhythm: Normal rate and regular rhythm.     Pulses: Normal pulses.  Pulmonary:     Effort: Pulmonary effort is normal.  Abdominal:     General: Abdomen is flat.     Palpations: Abdomen is soft.  Musculoskeletal:     Cervical back: Normal range of motion.     Comments: Right lower extremity with well-healing anterior incision  Skin:    General: Skin is warm.     Capillary Refill: Capillary refill takes less than 2 seconds.  Neurological:     General: No  focal deficit present.     Mental Status: She is alert.  Psychiatric:        Mood and Affect: Mood normal.     ED Results / Procedures / Treatments   Labs (all labs ordered are listed, but only abnormal results are displayed) Labs Reviewed  COMPREHENSIVE METABOLIC PANEL - Abnormal; Notable for the following components:      Result Value   Creatinine, Ser 1.35 (*)    Calcium 8.8 (*)    Total Protein 5.8 (*)    Albumin 3.0 (*)    GFR, Estimated 41 (*)    All other components within normal limits  TROPONIN I (HIGH SENSITIVITY) - Abnormal; Notable for the following components:   Troponin I (High Sensitivity) 62 (*)    All other components within normal limits  CBC  TROPONIN I (HIGH SENSITIVITY)    EKG EKG Interpretation  Date/Time:  Thursday July 17 2022 11:51:53 EDT Ventricular Rate:  96 PR Interval:  142 QRS Duration: 124 QT Interval:  366 QTC Calculation: 465 R Axis:   44 Text Interpretation: Sinus rhythm Probable left atrial enlargement Nonspecific intraventricular conduction delay Borderline ST depression, anterior leads ST depression V1-V3, suggest recording posterior leads SINCE LAST TRACING HEART RATE HAS INCREASED Confirmed by Pattricia Boss [phone]) on 07/17/2022 12:00:34 PM  Radiology DG Chest Port 1 View  Result Date: 07/17/2022 CLINICAL DATA:  Dizziness, weakness EXAM: PORTABLE CHEST 1 VIEW COMPARISON:  07/28/2021 FINDINGS: The heart size and mediastinal contours are within normal limits. Both lungs are clear. There are small calcifications adjacent to the greater tuberosity in both proximal humeri. IMPRESSION: No active cardiopulmonary disease. Possible calcific bursitis in both shoulders. Electronically Signed   By: Elmer Picker M.D.   On: 07/17/2022 12:33    Procedures Procedures    Medications Ordered in ED Medications  lactated ringers bolus 1,000 mL (0 mLs Intravenous Stopped 07/17/22 1345)    ED Course/ Medical Decision Making/ A&P Clinical  Course as of 07/17/22 1435  Thu Jul 17, 2022  5726 Complete metabolic panel reviewed interpreted creatinine increased to 1.35 from first prior of 1.2 [DR]  1349 Troponin reviewed interpreted elevated at 62 [DR]  1434 Chest x-Ahmoni Edge reviewed interpreted no evidence of acute abnormality noted radiologist interpretation concurs [DR]    Clinical Course User Index [DR] Pattricia Boss, MD                           Medical Decision Making 71 year old female presents today with orts that she was  tachycardic with a heart rate over 150.  She received adenosine prehospital and heart rate was then normal. She did not have chest pain but was lightheaded and weak with some chest discomfort She feels improved here after conversion. Patient with prehospital tachycardia was reportedly resolved with adenosine Patient has recently had knee replacement Here in the emergency department her blood pressure has been stable and heart rate has been stable.  She has been pain-free  Here she has elevated creatinine consistent with her increased p.o. intake Troponin is elevated in the 60s. She has no acute EKG changes and is not currently having chest pain Differential diagnosis includes but is not limited to volume depletion, tachycardia, SVT, acute coronary syndrome, A-fib with RVR and other arrhythmias Given patient's elevated troponin, plan admission for troponin trending and hydration Discussed care with cardiology they will see in consultation   Amount and/or Complexity of Data Reviewed Independent Historian: EMS    Details: Patient was tachycardic with a heart rate over 150.  Patient had narrow complex that was consistent with SVT.  Patient received adenosine 6 mg prehospital and then had normal sinus rhythm External Data Reviewed: ECG and notes.    Details: I reviewed the EMS tracings.  Patient had a regular heart rate of approximately 150 with a narrow complex assistant with SVT Labs: ordered. Decision-making  details documented in ED Course. Radiology: ordered and independent interpretation performed. Decision-making details documented in ED Course. ECG/medicine tests: ordered and independent interpretation performed. Decision-making details documented in ED Course. Discussion of management or test interpretation with external provider(s): Discussed with Dr. Lorin Mercy, on-call for hospitalist who will see for admission Discussed with Trish, on for cardiology cardiology will see in consultation  Risk Decision regarding hospitalization.  Critical Care Total time providing critical care: 30 minutes          Final Clinical Impression(s) / ED Diagnoses Final diagnoses:  Tachycardia  Elevated troponin  Dehydration    Rx / DC Orders ED Discharge Orders     None         Pattricia Boss, MD 07/17/22 1435

## 2022-07-17 NOTE — ED Notes (Signed)
Pt able to use bedside commode with assistance.

## 2022-07-17 NOTE — ED Triage Notes (Signed)
Dizzy this morning. Hx SVT. 8/25 R knee replacement (taking pain medication).  HR 220-230, neg CP  '6mg'$  Adenosine 100-102  18G LAC  108/787 97% 4L [place],  CBG 154

## 2022-07-17 NOTE — H&P (Signed)
History and Physical    Patient: Stephanie Daniel WCB:[id number] DOB: 07/25/1951 DOA: 07/17/2022 DOS: the patient was seen and examined on 07/17/2022 PCP: Pa, [place]  Patient coming from: Home - lives alone; NOK: Terrance Mass [place], [phone]   Chief Complaint: Near syncope  HPI: Stephanie Daniel is a 71 y.o. female with medical history significant of CAD, HTN, and hypothyroidism presenting with near syncope.  She underwent  R TKR on 8/25 with Dr. Veverly Fells and was discharged the following day.  She was trying to get ready for PT this AM and got acutely dizzy, couldn't hold anything, just went out of it but did not lose consciousness.  She had a recent TKR and has not been eating and drinking well.  She was feeling yucky with the pain medication.  She stopped taking it yesterday and started feeling a little better.  No CP.  She felt like she was breathing heavy but did not feel SOB.  She has h/o SVT, has a medicine to take when HR >100.  She noted one episode of SVT last week.  Today's episode did not feel like her usual SVT.  She feels good now and is hungry.    ER Course:  Knee replacement and not doing well since.  N/V with pain medication, poor PO intake.  Seen by EMS, ?SVT, given adenosine, felt better.  Has elevated troponin, needs to be followed.  Cardiology will consult.     Review of Systems: As mentioned in the history of present illness. All other systems reviewed and are negative. Past Medical History:  Diagnosis Date   Arthritis    Coronary artery disease    GERD (gastroesophageal reflux disease)    History of kidney stones    Hypertension    Hypothyroidism    Past Surgical History:  Procedure Laterality Date   ABDOMINAL HYSTERECTOMY     APPENDECTOMY     KNEE SURGERY  11/18/2011   TONSILLECTOMY     TOTAL KNEE ARTHROPLASTY Left 05/04/2017   Procedure: LEFT TOTAL KNEE ARTHROPLASTY;  Surgeon: Paralee Cancel, MD;  Location: [facility];  Service:  Orthopedics;  Laterality: Left;  90 mins   TOTAL KNEE ARTHROPLASTY Right 07/11/2022   Procedure: TOTAL KNEE ARTHROPLASTY;  Surgeon: Netta Cedars, MD;  Location: [facility];  Service: Orthopedics;  Laterality: Right;   Social History:  reports that she quit smoking about 33 years ago. Her smoking use included cigarettes. She has never used smokeless tobacco. She reports current alcohol use. She reports that she does not use drugs.  Allergies  Allergen Reactions   Albuterol Other (See Comments)    Respiratory distress    Family History  Problem Relation Age of Onset   COPD Mother    Heart disease Mother    Heart disease Father    Cancer Father    COPD Brother    Hypertension Son     Prior to Admission medications   Medication Sig Start Date End Date Taking? Authorizing Provider  acyclovir (ZOVIRAX) 400 MG tablet Take 400 mg by mouth 2 (two) times daily. 02/23/17   [provider]  aspirin EC 81 MG tablet Take 1 tablet (81 mg total) by mouth 2 (two) times daily. Swallow whole. 07/11/22 08/10/22  Netta Cedars, MD  atorvastatin (LIPITOR) 40 MG tablet Take 1 tablet (40 mg total) by mouth daily. 08/26/21   Geralynn Rile, MD  buPROPion (WELLBUTRIN XL) 150 MG 24 hr tablet Take 150 mg by mouth  daily.    [provider]  gabapentin (NEURONTIN) 300 MG capsule Take 300 mg by mouth at bedtime.    [provider]  levothyroxine (SYNTHROID, LEVOTHROID) 50 MCG tablet Take 50 mcg by mouth daily before breakfast. 02/17/17   [provider]  lisinopril (ZESTRIL) 20 MG tablet Take 1 tablet (20 mg total) by mouth daily. 08/26/21   O'NealCassie Freer, MD  meloxicam (MOBIC) 15 MG tablet Take 15 mg by mouth daily as needed for pain. 04/09/22   [provider]  methocarbamol (ROBAXIN) 500 MG tablet Take 1 tablet (500 mg total) by mouth every 8 (eight) hours as needed for muscle spasms. 07/11/22   Netta Cedars, MD  metoprolol tartrate (LOPRESSOR) 25 MG tablet Take  1 tablet (25 mg total) by mouth daily as needed (for heart rate over 100 bpm). 05/14/22   O'Neal, Cassie Freer, MD  nitroGLYCERIN (NITROSTAT) 0.4 MG SL tablet Place 1 tablet (0.4 mg total) under the tongue every 5 (five) minutes x 3 doses as needed for chest pain. 07/29/21   Margie Billet, NP  omeprazole (PRILOSEC) 40 MG capsule Take 40 mg by mouth daily.    [provider]  ondansetron (ZOFRAN) 4 MG tablet Take 1 tablet (4 mg total) by mouth every 8 (eight) hours as needed for nausea, vomiting or refractory nausea / vomiting. 07/11/22 07/11/23  Netta Cedars, MD  oxyCODONE-acetaminophen (PERCOCET) 5-325 MG tablet Take 1-2 tablets by mouth every 4 (four) hours as needed for severe pain. 07/11/22 07/11/23  Netta Cedars, MD  Potassium 99 MG TABS Take 2 tablets by mouth at bedtime.    [provider]    Physical Exam: Vitals:   07/17/22 1430 07/17/22 1500 07/17/22 1527 07/17/22 1550  BP: (!) 138/93 (!) 146/85  (!) 161/86  Pulse: 82 79  78  Resp: '20 13  20  '$ Temp:   98.5 F (36.9 C) 98.5 F (36.9 C)  TempSrc:   Oral Oral  SpO2: 98% 97%  97%  Weight:    73 kg  Height:    '5\' 4"'$  (1.626 m)   General:  Appears calm and comfortable and is in NAD Eyes:  PERRL, EOMI, normal lids, iris ENT:  grossly normal hearing, lips & tongue, mmm; appropriate dentition Neck:  no LAD, masses or thyromegaly Cardiovascular:  RRR, no m/r/g. No LE edema.  Respiratory:   CTA bilaterally with no wheezes/rales/rhonchi.  Normal respiratory effort. Abdomen:  soft, NT, ND Skin:  no rash or induration seen on limited exam Musculoskeletal:  grossly normal tone BUE/BLE, no bony abnormality, bandage on R knee is C/D/I Psychiatric:  grossly normal mood and affect, speech fluent and appropriate, AOx3 Neurologic:  CN 2-12 grossly intact, moves all extremities in coordinated fashion   Radiological Exams on Admission: Independently reviewed - see discussion in A/P where applicable  DG Chest Port 1 View  Result  Date: 07/17/2022 CLINICAL DATA:  Dizziness, weakness EXAM: PORTABLE CHEST 1 VIEW COMPARISON:  07/28/2021 FINDINGS: The heart size and mediastinal contours are within normal limits. Both lungs are clear. There are small calcifications adjacent to the greater tuberosity in both proximal humeri. IMPRESSION: No active cardiopulmonary disease. Possible calcific bursitis in both shoulders. Electronically Signed   By: Elmer Picker M.D.   On: 07/17/2022 12:33    EKG: Independently reviewed.   1151 - NSR with rate 96; nonspecific ST changes with no evidence of acute ischemia 1155 - NSR with rate 96; nonspecific ST changes with no evidence of acute  ischemia   Labs on Admission: I have personally reviewed the available labs and imaging studies at the time of the admission.  Pertinent labs:    BUN 13/Creatinine 1.35/GFR 41 - stable Albumin 3.0 Normal CBC Troponin 62, 56   Assessment and Plan: Principal Problem:   Near syncope Active Problems:   Essential hypertension, benign   Hypothyroidism   CAD (coronary artery disease)   Status post total knee replacement, right   SVT (supraventricular tachycardia) (HCC)   Chronic kidney disease, stage 3a (HCC)    Near syncope -Patient with TKR last week -She reports n/v and weakness from the pain medication with poor PO intake -She stopped the medication yesterday and feels better from that standpoint but did get acutely lightheaded today -EMS was called and their tracing showed SVT which resolved with adenosine x 1 -Mildly elevated troponin with negative delta, likely related to demand ischemia -Will observe overnight on telemetry with IVF  SVT -Patient reports h/o  -She takes metoprolol as needed for HR >100 -Today's episode felt different from her usual SVT -Will monitor overnight on telemetry  Recent TKR -R knee appears to be healing well -PT already established as outpatient -Will order PT/OT consults while here  CAD -Continue  ASA -She did not report any CP -Troponin was minimally elevated with negative delta, low suspicion for ACS  HTN -Resume home BP meds tomorrow - lisinopril, lopressor  Stage 3a CKD -Appears to stable, possibly marginally worse than baseline -Hydrating overnight -Will recheck in AM -Would use caution with Mobic  Hypothyroidism -Continue Synthroid  HLD -Continue atorvastatin  Mood d/o -Continue Wellbutrin  DNR -I have discussed code status with the patient and her cousin and  they are in agreement that the patient would not desire resuscitation and would prefer to die a natural death should that situation arise. -She will need a gold out of facility DNR form at the time of discharge     Advance Care Planning:   Code Status: DNR   Consults: Cardiology; nutrition; TOC team; PT/OT  DVT Prophylaxis: Lovenox  Family Communication: Maudry Diego was present throughout evaluation  Severity of Illness: The appropriate patient status for this patient is OBSERVATION. Observation status is judged to be reasonable and necessary in order to provide the required intensity of service to ensure the patient's safety. The patient's presenting symptoms, physical exam findings, and initial radiographic and laboratory data in the context of their medical condition is felt to place them at decreased risk for further clinical deterioration. Furthermore, it is anticipated that the patient will be medically stable for discharge from the hospital within 2 midnights of admission.   Author: Karmen Bongo, MD 07/17/2022 6:14 PM  For on call review [email or web address].

## 2022-07-17 NOTE — ED Notes (Signed)
ED TO INPATIENT HANDOFF REPORT  ED Nurse Name and Phone #: Delsa Sale RN [phone]  S Name/Age/Gender Stephanie Daniel 71 y.o. female Room/Bed: 001C/001C  Code Status   Code Status: Prior  Home/SNF/Other Home Patient oriented to: self, place, time, and situation Is this baseline? Yes   Triage Complete: Triage complete  Chief Complaint SVT (supraventricular tachycardia) ([place]) [I47.1]  Triage Note Dizzy this morning. Hx SVT. 8/25 R knee replacement (taking pain medication).  HR 220-230, neg CP  '6mg'$  Adenosine 100-102  18G LAC  108/787 97% 4L [place],  CBG 154   Allergies Allergies  Allergen Reactions   Albuterol Other (See Comments)    Respiratory distress    Level of Care/Admitting Diagnosis ED Disposition     ED Disposition  Admit   Condition  --   Comment  Hospital Area: [place] [[id number]]  Level of Care: Telemetry Cardiac [103]  May place patient in observation at [facility] or [place] if equivalent level of care is available:: No  Covid Evaluation: Asymptomatic - no recent exposure (last 10 days) testing not required  Diagnosis: SVT (supraventricular tachycardia) ([place]) [454098]  Admitting Physician: Karmen Bongo [[id number]]  Attending Physician: Karmen Bongo [[id number]]          B Medical/Surgery History Past Medical History:  Diagnosis Date   Arthritis    Coronary artery disease    GERD (gastroesophageal reflux disease)    History of kidney stones    Hypertension    Hypothyroidism    Past Surgical History:  Procedure Laterality Date   ABDOMINAL HYSTERECTOMY     APPENDECTOMY     KNEE SURGERY  11/18/2011   TONSILLECTOMY     TOTAL KNEE ARTHROPLASTY Left 05/04/2017   Procedure: LEFT TOTAL KNEE ARTHROPLASTY;  Surgeon: Paralee Cancel, MD;  Location: [facility];  Service: Orthopedics;  Laterality: Left;  90 mins   TOTAL KNEE ARTHROPLASTY Right 07/11/2022   Procedure: TOTAL KNEE ARTHROPLASTY;  Surgeon: Netta Cedars, MD;  Location: [facility];   Service: Orthopedics;  Laterality: Right;     A IV Location/Drains/Wounds Patient Lines/Drains/Airways Status     Active Line/Drains/Airways     Name Placement date Placement time Site Days   Peripheral IV 07/17/22 18 G Left Antecubital 07/17/22  1213  Antecubital  less than 1   Incision (Closed) 07/11/22 Knee Right 07/11/22  1022  -- 6            Intake/Output Last 24 hours  Intake/Output Summary (Last 24 hours) at 07/17/2022 1447 Last data filed at 07/17/2022 1345 Gross per 24 hour  Intake 1000 ml  Output --  Net 1000 ml    Labs/Imaging Results for orders placed or performed during the hospital encounter of 07/17/22 (from the past 48 hour(s))  CBC     Status: None   Collection Time: 07/17/22 12:15 PM  Result Value Ref Range   WBC 9.3 4.0 - 10.5 K/uL   RBC 4.10 3.87 - 5.11 MIL/uL   Hemoglobin 13.2 12.0 - 15.0 g/dL   HCT 38.5 36.0 - 46.0 %   MCV 93.9 80.0 - 100.0 fL   MCH 32.2 26.0 - 34.0 pg   MCHC 34.3 30.0 - 36.0 g/dL   RDW 14.2 11.5 - 15.5 %   Platelets 311 150 - 400 K/uL   nRBC 0.0 0.0 - 0.2 %    Comment: Performed at [facility], [place] [address] [place], [place] [address]  Comprehensive metabolic panel  Status: Abnormal   Collection Time: 07/17/22 12:15 PM  Result Value Ref Range   Sodium 136 135 - 145 mmol/L   Potassium 4.2 3.5 - 5.1 mmol/L   Chloride 102 98 - 111 mmol/L   CO2 23 22 - 32 mmol/L   Glucose, Bld 83 70 - 99 mg/dL    Comment: Glucose reference range applies only to samples taken after fasting for at least 8 hours.   BUN 13 8 - 23 mg/dL   Creatinine, Ser 1.35 (H) 0.44 - 1.00 mg/dL   Calcium 8.8 (L) 8.9 - 10.3 mg/dL   Total Protein 5.8 (L) 6.5 - 8.1 g/dL   Albumin 3.0 (L) 3.5 - 5.0 g/dL   AST 22 15 - 41 U/L   ALT 13 0 - 44 U/L   Alkaline Phosphatase 62 38 - 126 U/L   Total Bilirubin 0.8 0.3 - 1.2 mg/dL   GFR, Estimated 41 (L) >60 mL/min    Comment: (NOTE) Calculated using the CKD-EPI Creatinine Equation (2021)    Anion gap 11  5 - 15    Comment: Performed at [facility], [place] [address], [place], [place] [address]  Troponin I (High Sensitivity)     Status: Abnormal   Collection Time: 07/17/22 12:15 PM  Result Value Ref Range   Troponin I (High Sensitivity) 62 (H) <18 ng/L    Comment: (NOTE) Elevated high sensitivity troponin I (hsTnI) values and significant  changes across serial measurements may suggest ACS but many other  chronic and acute conditions are known to elevate hsTnI results.  Refer to the "Links" section for chest pain algorithms and additional  guidance. Performed at [facility], [place] [address] [place], [place] [address]    DG Chest Port 1 View  Result Date: 07/17/2022 CLINICAL DATA:  Dizziness, weakness EXAM: PORTABLE CHEST 1 VIEW COMPARISON:  07/28/2021 FINDINGS: The heart size and mediastinal contours are within normal limits. Both lungs are clear. There are small calcifications adjacent to the greater tuberosity in both proximal humeri. IMPRESSION: No active cardiopulmonary disease. Possible calcific bursitis in both shoulders. Electronically Signed   By: Elmer Picker M.D.   On: 07/17/2022 12:33    Pending Labs Unresulted Labs (From admission, onward)    None       Vitals/Pain Today's Vitals   07/17/22 1300 07/17/22 1330 07/17/22 1400 07/17/22 1430  BP: 131/75 (!) 146/88 (!) 147/80 (!) 138/93  Pulse: 84 87 86 82  Resp: (!) '21 16 17 20  '$ Temp:      TempSrc:      SpO2: 98% 99% 100% 98%  Weight:      Height:        Isolation Precautions No active isolations  Medications Medications  lactated ringers bolus 1,000 mL (0 mLs Intravenous Stopped 07/17/22 1345)    Mobility Pt had recent right sided knee replacement and needs assistance out of bed.  Moderate fall risk   Focused Assessments Cardiac Assessment Handoff:    No results found for: "CKTOTAL", "CKMB", "CKMBINDEX", "TROPONINI" No results found for: "DDIMER" Does the Patient currently have chest  pain? No    R Recommendations: See Admitting Provider Note  Report given to:   Additional Notes:

## 2022-07-18 ENCOUNTER — Observation Stay (HOSPITAL_COMMUNITY): Payer: Medicare Other

## 2022-07-18 DIAGNOSIS — Z888 Allergy status to other drugs, medicaments and biological substances status: Secondary | ICD-10-CM | POA: Diagnosis not present

## 2022-07-18 DIAGNOSIS — Z96651 Presence of right artificial knee joint: Secondary | ICD-10-CM | POA: Diagnosis not present

## 2022-07-18 DIAGNOSIS — E038 Other specified hypothyroidism: Secondary | ICD-10-CM | POA: Diagnosis not present

## 2022-07-18 DIAGNOSIS — I10 Essential (primary) hypertension: Secondary | ICD-10-CM | POA: Diagnosis not present

## 2022-07-18 DIAGNOSIS — R55 Syncope and collapse: Secondary | ICD-10-CM | POA: Diagnosis present

## 2022-07-18 DIAGNOSIS — E860 Dehydration: Secondary | ICD-10-CM | POA: Diagnosis present

## 2022-07-18 DIAGNOSIS — N1831 Chronic kidney disease, stage 3a: Secondary | ICD-10-CM | POA: Diagnosis present

## 2022-07-18 DIAGNOSIS — R778 Other specified abnormalities of plasma proteins: Secondary | ICD-10-CM

## 2022-07-18 DIAGNOSIS — Z87442 Personal history of urinary calculi: Secondary | ICD-10-CM | POA: Diagnosis not present

## 2022-07-18 DIAGNOSIS — Z79890 Hormone replacement therapy (postmenopausal): Secondary | ICD-10-CM | POA: Diagnosis not present

## 2022-07-18 DIAGNOSIS — E785 Hyperlipidemia, unspecified: Secondary | ICD-10-CM | POA: Diagnosis present

## 2022-07-18 DIAGNOSIS — I2510 Atherosclerotic heart disease of native coronary artery without angina pectoris: Secondary | ICD-10-CM

## 2022-07-18 DIAGNOSIS — I5032 Chronic diastolic (congestive) heart failure: Secondary | ICD-10-CM

## 2022-07-18 DIAGNOSIS — Z87891 Personal history of nicotine dependence: Secondary | ICD-10-CM | POA: Diagnosis not present

## 2022-07-18 DIAGNOSIS — E039 Hypothyroidism, unspecified: Secondary | ICD-10-CM | POA: Diagnosis present

## 2022-07-18 DIAGNOSIS — Z66 Do not resuscitate: Secondary | ICD-10-CM | POA: Diagnosis present

## 2022-07-18 DIAGNOSIS — I471 Supraventricular tachycardia: Secondary | ICD-10-CM | POA: Diagnosis not present

## 2022-07-18 DIAGNOSIS — E876 Hypokalemia: Secondary | ICD-10-CM | POA: Diagnosis not present

## 2022-07-18 DIAGNOSIS — R000 Tachycardia, unspecified: Secondary | ICD-10-CM | POA: Diagnosis not present

## 2022-07-18 DIAGNOSIS — Z90710 Acquired absence of both cervix and uterus: Secondary | ICD-10-CM | POA: Diagnosis not present

## 2022-07-18 DIAGNOSIS — K219 Gastro-esophageal reflux disease without esophagitis: Secondary | ICD-10-CM | POA: Diagnosis present

## 2022-07-18 DIAGNOSIS — Z9049 Acquired absence of other specified parts of digestive tract: Secondary | ICD-10-CM | POA: Diagnosis not present

## 2022-07-18 DIAGNOSIS — I248 Other forms of acute ischemic heart disease: Secondary | ICD-10-CM

## 2022-07-18 DIAGNOSIS — F39 Unspecified mood [affective] disorder: Secondary | ICD-10-CM | POA: Diagnosis present

## 2022-07-18 DIAGNOSIS — Z79899 Other long term (current) drug therapy: Secondary | ICD-10-CM | POA: Diagnosis not present

## 2022-07-18 DIAGNOSIS — Z96653 Presence of artificial knee joint, bilateral: Secondary | ICD-10-CM | POA: Diagnosis present

## 2022-07-18 DIAGNOSIS — Z8249 Family history of ischemic heart disease and other diseases of the circulatory system: Secondary | ICD-10-CM | POA: Diagnosis not present

## 2022-07-18 DIAGNOSIS — I130 Hypertensive heart and chronic kidney disease with heart failure and stage 1 through stage 4 chronic kidney disease, or unspecified chronic kidney disease: Secondary | ICD-10-CM | POA: Diagnosis present

## 2022-07-18 LAB — TROPONIN I (HIGH SENSITIVITY)
Troponin I (High Sensitivity): 55 ng/L — ABNORMAL HIGH (ref <18)
Troponin I (High Sensitivity): 44 ng/L — ABNORMAL HIGH (ref <18)
Troponin I (High Sensitivity): 41 ng/L — ABNORMAL HIGH (ref <18)

## 2022-07-18 LAB — TSH: TSH: 1.756 u[IU]/mL (ref 0.350–4.500)

## 2022-07-18 LAB — BASIC METABOLIC PANEL
Anion gap: 9 (ref 5–15)
BUN: 11 mg/dL (ref 8–23)
CO2: 26 mmol/L (ref 22–32)
Calcium: 8.5 mg/dL — ABNORMAL LOW (ref 8.9–10.3)
Chloride: 102 mmol/L (ref 98–111)
Creatinine, Ser: 1.21 mg/dL — ABNORMAL HIGH (ref 0.44–1.00)
GFR, Estimated: 47 mL/min — ABNORMAL LOW (ref >60)
Glucose, Bld: 78 mg/dL (ref 70–99)
Potassium: 3.5 mmol/L (ref 3.5–5.1)
Sodium: 137 mmol/L (ref 135–145)

## 2022-07-18 LAB — ECHOCARDIOGRAM COMPLETE
AR max vel: 3.2 cm2
AV Peak grad: 8.1 mmHg
Ao pk vel: 1.43 m/s
Area-P 1/2: 2.69 cm2
Height: 64 in
S' Lateral: 1.6 cm
Weight: 2592 oz

## 2022-07-18 LAB — CBC
HCT: 33.6 % — ABNORMAL LOW (ref 36.0–46.0)
Hemoglobin: 11.3 g/dL — ABNORMAL LOW (ref 12.0–15.0)
MCH: 32.3 pg (ref 26.0–34.0)
MCHC: 33.6 g/dL (ref 30.0–36.0)
MCV: 96 fL (ref 80.0–100.0)
Platelets: 284 10*3/uL (ref 150–400)
RBC: 3.5 MIL/uL — ABNORMAL LOW (ref 3.87–5.11)
RDW: 14.6 % (ref 11.5–15.5)
WBC: 6.4 10*3/uL (ref 4.0–10.5)
nRBC: 0 % (ref 0.0–0.2)

## 2022-07-18 NOTE — Evaluation (Signed)
Physical Therapy Evaluation Patient Details Name: Stephanie Daniel MRN: [id number] DOB: 08/30/1951 Today's Date: 07/18/2022  History of Present Illness  71 yo female with recent knee replacement who presented with lightheadedness, near syncope, and svt.  PMH includes L TKR, CAD, HTN.  Clinical Impression  PT presents to PT with deficits in RLE strength, power, and ROM, along with gait deviations. Pt is progressing well since R TKA last week, ambulating for household distances without physical assistance. PT provides reinforcement of HEP in order to continue progression of ROM until outpatient PT is initiated.       Recommendations for follow up therapy are one component of a multi-disciplinary discharge planning process, led by the attending physician.  Recommendations may be updated based on patient status, additional functional criteria and insurance authorization.  Follow Up Recommendations Outpatient PT      Assistance Recommended at Discharge Intermittent Supervision/Assistance  Patient can return home with the following  A little help with walking and/or transfers;A little help with bathing/dressing/bathroom;Assistance with cooking/housework;Assist for transportation;Help with stairs or ramp for entrance    Equipment Recommendations None recommended by PT  Recommendations for Other Services       Functional Status Assessment Patient has had a recent decline in their functional status and demonstrates the ability to make significant improvements in function in a reasonable and predictable amount of time.     Precautions / Restrictions Precautions Precautions: Fall;Knee Restrictions Weight Bearing Restrictions: Yes RLE Weight Bearing: Weight bearing as tolerated      Mobility  Bed Mobility Overal bed mobility: Modified Independent Bed Mobility: Supine to Sit     Supine to sit: Modified independent (Device/Increase time)     General bed mobility comments: pt hooking LLE  under RLE to assist    Transfers Overall transfer level: Modified independent Equipment used: Rolling walker (2 wheels) Transfers: Sit to/from Stand Sit to Stand: Modified independent (Device/Increase time)                Ambulation/Gait Ambulation/Gait assistance: Supervision Gait Distance (Feet): 100 Feet Assistive device: Rolling walker (2 wheels) Gait Pattern/deviations: Step-through pattern Gait velocity: reduced Gait velocity interpretation: <1.8 ft/sec, indicate of risk for recurrent falls   General Gait Details: pt with slowed step-through gait, reduced stance time on RLE  Stairs            Wheelchair Mobility    Modified Rankin (Stroke Patients Only)       Balance Overall balance assessment: Needs assistance Sitting-balance support: No upper extremity supported, Feet supported Sitting balance-Leahy Scale: Good     Standing balance support: Single extremity supported, Bilateral upper extremity supported, Reliant on assistive device for balance Standing balance-Leahy Scale: Poor                               Pertinent Vitals/Pain Pain Assessment Pain Assessment: 0-10 Pain Score: 3  Pain Location: R knee Pain Descriptors / Indicators: Sore Pain Intervention(s): Monitored during session    Home Living Family/patient expects to be discharged to:: Private residence Living Arrangements: Other relatives (dgtr staying with her since surgery) Available Help at Discharge: Family;Available 24 hours/day Type of Home: House Home Access: Level entry       Home Layout: One level Home Equipment: Conservation officer, nature (2 wheels);Electric scooter;Hand held shower head;BSC/3in1;Shower seat - built in      Prior Function Prior Level of Function : Independent/Modified Independent  Mobility Comments: pt reports ambulating household distances with walker ADLs Comments: modI with ADLs since surgery     Hand Dominance   Dominant  Hand: Right    Extremity/Trunk Assessment   Upper Extremity Assessment Upper Extremity Assessment: Overall WFL for tasks assessed    Lower Extremity Assessment Lower Extremity Assessment: RLE deficits/detail RLE Deficits / Details: 3-/5 knee extension, PF/DF WFL, knee flexion remains limited 2/2 TKA    Cervical / Trunk Assessment Cervical / Trunk Assessment: Normal  Communication   Communication: HOH  Cognition Arousal/Alertness: Awake/alert Behavior During Therapy: WFL for tasks assessed/performed Overall Cognitive Status: Within Functional Limits for tasks assessed                                          General Comments General comments (skin integrity, edema, etc.): VSS on RA    Exercises Other Exercises Other Exercises: PT encourages continues heel slides and knee flexion with overpressure from LLE   Assessment/Plan    PT Assessment Patient needs continued PT services  PT Problem List Decreased strength;Decreased range of motion;Decreased activity tolerance;Decreased balance;Decreased mobility;Decreased safety awareness;Decreased knowledge of use of DME;Pain       PT Treatment Interventions Gait training;DME instruction;Therapeutic activities;Therapeutic exercise;Functional mobility training;Patient/family education    PT Goals (Current goals can be found in the Care Plan section)  Acute Rehab PT Goals Patient Stated Goal: to go home PT Goal Formulation: With patient Time For Goal Achievement: 08/01/22 Potential to Achieve Goals: Good    Frequency Min 5X/week     Co-evaluation               AM-PAC PT "6 Clicks" Mobility  Outcome Measure Help needed turning from your back to your side while in a flat bed without using bedrails?: None Help needed moving from lying on your back to sitting on the side of a flat bed without using bedrails?: None Help needed moving to and from a bed to a chair (including a wheelchair)?: None Help needed  standing up from a chair using your arms (e.g., wheelchair or bedside chair)?: None Help needed to walk in hospital room?: A Little Help needed climbing 3-5 steps with a railing? : A Little 6 Click Score: 22    End of Session   Activity Tolerance: Patient tolerated treatment well Patient left: in chair;with call bell/phone within reach Nurse Communication: Mobility status PT Visit Diagnosis: Difficulty in walking, not elsewhere classified (R26.2);Muscle weakness (generalized) (M62.81);Pain;Unsteadiness on feet (R26.81) Pain - Right/Left: Right Pain - part of body: Knee    Time: 0800-0817 PT Time Calculation (min) (ACUTE ONLY): 17 min   Charges:   PT Evaluation $PT Eval Low Complexity: [place], PT, DPT Acute Rehabilitation Office [phone]   Zenaida Niece 07/18/2022, 8:27 AM

## 2022-07-18 NOTE — Care Management Obs Status (Signed)
MEDICARE OBSERVATION STATUS NOTIFICATION   Patient Details  Name: Stephanie Daniel MRN: [id number] Date of Birth: Aug 15, 1951   Medicare Observation Status Notification Given:  Yes    Zenon Mayo, RN 07/18/2022, 4:28 PM

## 2022-07-18 NOTE — Consult Note (Addendum)
Cardiology Consultation   Patient ID: Stephanie Daniel MRN: [id number]; DOB: January 17, 1951  Admit date: 07/17/2022 Date of Consult: 07/18/2022  PCP:  [facility] [place] [facility] Cardiologist:  Evalina Field, MD        Patient Profile:   Stephanie Daniel is a 71 y.o. female with a hx of mod CAD w/ neg FFR 07/2021, SVT, OA s/p R TKR 07/11/2022, HTN, hypothyroid, who is being seen 07/18/2022 for the evaluation of near-syncope and SVT at the request of Dr Sherral Hammers.  History of Present Illness:   Stephanie Daniel had a same-day d/c after her TKR on 08/25. Since then, she has had poor po intake due to N&V from pain meds, oxy made her feel poorly. She stopped the pain meds and felt a little better. +DOE, one episode SVT last week.   She had near-syncope today, when getting ready for PT. She did not fall or completely lose consciousness. EMS was called and she was in SVT >> Adenosine >> SR. Pt had not recently take prn metop. Cards asked to see.   Stephanie Daniel gets SVT every 4 weeks or so.  She took her as needed metoprolol last week when she had an episode.  She is very clear that she knows when she gets sick, she says she can feel it coming on.  Today, she feels like she was dehydrated from poor p.o. intake from the nausea and vomiting caused by the oxycodone.  She was a little weak from the surgery still.  She feels that she likely had some dizziness and a lightheaded feeling from the dehydration, and then got the SVT.  She admits that today she did not feel the SVT although she normally does.  Ever since she was given adenosine and the SVT resolved, she has felt fine.  She did not have any chest pain with the SVT.  She has an appointment next week with Dr. Lars Mage.  She is agreeable to getting an ablation, because she does not like the way the SVT makes her feel.  At one point, she was on Toprol XL daily    Past Medical History:  Diagnosis Date    Arthritis    Coronary artery disease    GERD (gastroesophageal reflux disease)    History of kidney stones    Hypertension    Hypothyroidism     Past Surgical History:  Procedure Laterality Date   ABDOMINAL HYSTERECTOMY     APPENDECTOMY     KNEE SURGERY  11/18/2011   TONSILLECTOMY     TOTAL KNEE ARTHROPLASTY Left 05/04/2017   Procedure: LEFT TOTAL KNEE ARTHROPLASTY;  Surgeon: Paralee Cancel, MD;  Location: [facility];  Service: Orthopedics;  Laterality: Left;  90 mins   TOTAL KNEE ARTHROPLASTY Right 07/11/2022   Procedure: TOTAL KNEE ARTHROPLASTY;  Surgeon: Netta Cedars, MD;  Location: [facility];  Service: Orthopedics;  Laterality: Right;     Home Medications:  Prior to Admission medications   Medication Sig Start Date End Date Taking? Authorizing Provider  acyclovir (ZOVIRAX) 400 MG tablet Take 400 mg by mouth 2 (two) times daily. 02/23/17  Yes [provider]  aspirin EC 81 MG tablet Take 1 tablet (81 mg total) by mouth 2 (two) times daily. Swallow whole. 07/11/22 08/10/22 Yes Netta Cedars, MD  atorvastatin (LIPITOR) 40 MG tablet Take 1 tablet (40 mg total) by mouth daily. 08/26/21  Yes O'Neal, Cassie Freer, MD  buPROPion (WELLBUTRIN XL)  150 MG 24 hr tablet Take 150 mg by mouth daily.   Yes [provider]  gabapentin (NEURONTIN) 300 MG capsule Take 300 mg by mouth at bedtime.   Yes [provider]  HYDROcodone-acetaminophen (NORCO/VICODIN) 5-325 MG tablet Take 1 tablet by mouth every 6 (six) hours as needed for moderate pain.   Yes [provider]  levothyroxine (SYNTHROID, LEVOTHROID) 50 MCG tablet Take 50 mcg by mouth daily before breakfast. 02/17/17  Yes [provider]  lisinopril (ZESTRIL) 20 MG tablet Take 1 tablet (20 mg total) by mouth daily. 08/26/21  Yes O'Neal, Cassie Freer, MD  meloxicam (MOBIC) 15 MG tablet Take 15 mg by mouth daily as needed for pain. 04/09/22  Yes [provider]  methocarbamol (ROBAXIN) 500 MG tablet Take 1  tablet (500 mg total) by mouth every 8 (eight) hours as needed for muscle spasms. 07/11/22  Yes Netta Cedars, MD  metoprolol tartrate (LOPRESSOR) 25 MG tablet Take 1 tablet (25 mg total) by mouth daily as needed (for heart rate over 100 bpm). 05/14/22  Yes O'Neal, Cassie Freer, MD  nitroGLYCERIN (NITROSTAT) 0.4 MG SL tablet Place 1 tablet (0.4 mg total) under the tongue every 5 (five) minutes x 3 doses as needed for chest pain. 07/29/21  Yes Margie Billet, NP  omeprazole (PRILOSEC) 40 MG capsule Take 40 mg by mouth daily.   Yes [provider]  ondansetron (ZOFRAN) 4 MG tablet Take 1 tablet (4 mg total) by mouth every 8 (eight) hours as needed for nausea, vomiting or refractory nausea / vomiting. 07/11/22 07/11/23 Yes Netta Cedars, MD  Potassium 99 MG TABS Take 2 tablets by mouth at bedtime.   Yes [provider]  oxyCODONE-acetaminophen (PERCOCET) 5-325 MG tablet Take 1-2 tablets by mouth every 4 (four) hours as needed for severe pain. Patient not taking: Reported on 07/17/2022 07/11/22 07/11/23  Netta Cedars, MD    Inpatient Medications: Scheduled Meds:  acyclovir  400 mg Oral BID   aspirin EC  81 mg Oral BID   atorvastatin  40 mg Oral Daily   buPROPion  150 mg Oral Daily   enoxaparin (LOVENOX) injection  40 mg Subcutaneous Q24H   gabapentin  300 mg Oral QHS   levothyroxine  50 mcg Oral QAC breakfast   lisinopril  20 mg Oral Daily   pantoprazole  40 mg Oral Daily   sodium chloride flush  3 mL Intravenous Q12H   Continuous Infusions:  lactated ringers 75 mL/hr at 07/18/22 0546   PRN Meds: acetaminophen **OR** acetaminophen, hydrALAZINE, methocarbamol, metoprolol tartrate, morphine injection, ondansetron **OR** ondansetron (ZOFRAN) IV, oxyCODONE  Allergies:    Allergies  Allergen Reactions   Albuterol Other (See Comments)    Respiratory distress    Social History:   Social History   Socioeconomic History   Marital status: Divorced    Spouse name: Not on file    Number of children: Not on file   Years of education: Not on file   Highest education level: Not on file  Occupational History   Occupation: retired  Tobacco Use   Smoking status: Former    Years: 20.00    Types: Cigarettes    Quit date: 04/28/1989    Years since quitting: 33.2   Smokeless tobacco: Never  Vaping Use   Vaping Use: Never used  Substance and Sexual Activity   Alcohol use: Yes    Comment: Occas   Drug use: No   Sexual activity: Not on file  Other Topics  Concern   Not on file  Social History Narrative   Not on file   Social Determinants of Health   Financial Resource Strain: Not on file  Food Insecurity: Not on file  Transportation Needs: Not on file  Physical Activity: Not on file  Stress: Not on file  Social Connections: Not on file  Intimate Partner Violence: Not on file    Family History:   Family History  Problem Relation Age of Onset   COPD Mother    Heart disease Mother    Heart disease Father    Cancer Father    COPD Brother    Hypertension Son      ROS:  Please see the history of present illness.  All other ROS reviewed and negative.     Physical Exam/Data:   Vitals:   07/17/22 2015 07/17/22 2352 07/18/22 0521 07/18/22 0751  BP: 126/68 125/73 (!) 148/71 129/76  Pulse: 81 82 72 73  Resp: '20 18 18 18  '$ Temp: 98 F (36.7 C) 98.6 F (37 C) 97.9 F (36.6 C) 97.6 F (36.4 C)  TempSrc: Oral Oral Oral Oral  SpO2: 94% 98% 94% 95%  Weight:   73.5 kg   Height:        Intake/Output Summary (Last 24 hours) at 07/18/2022 1313 Last data filed at 07/18/2022 1251 Gross per 24 hour  Intake 1913.15 ml  Output 126 ml  Net 1787.15 ml      07/18/2022    5:21 AM 07/17/2022    3:50 PM 07/17/2022   11:50 AM  Last 3 Weights  Weight (lbs) 162 lb 160 lb 15 oz 160 lb  Weight (kg) 73.483 kg 73 kg 72.576 kg     Body mass index is 27.81 kg/m.  General:  Well nourished, well developed, in no acute distress HEENT: normal Neck: no JVD Vascular: No  carotid bruits; Distal pulses 2+ bilaterally Cardiac:  normal S1, S2; RRR; no murmur  Lungs:  clear to auscultation bilaterally, no wheezing, rhonchi or rales  Abd: soft, nontender, no hepatomegaly  Ext: no edema Musculoskeletal:  No deformities, BUE and BLE strength not tested due to surgery.  Mild right knee edema Skin: warm and dry right knee incision is bandaged and dressed and not disturbed. Neuro:  CNs 2-12 intact, no focal abnormalities noted Psych:  Normal affect   EKG:  The EKG was personally reviewed and demonstrates: Sinus rhythm, right bundle is old, heart rate 92 Telemetry:  Telemetry was personally reviewed and demonstrates: Sinus rhythm  Relevant CV Studies:  CARDIAC CT FFR: 07/29/2021 FINDINGS: FFRct analysis was performed on the original cardiac CT angiogram dataset. Diagrammatic representation of the FFRct analysis is provided in a separate PDF document in PACS. This dictation was created using the PDF document and an interactive 3D model of the results. 3D model is not available in the EMR/PACS. Normal FFR range is >0.80.   1. LM: FFRct 1.0 (insignificant)   2. LAD: FFRct 0.97 proximal, 0.87 mid, 0.78 distal (insignificant). D1 FFRct 0.86 (insignificant)   3. LCX: FFRct 0.87 proximal, 0.79 mid (insignificant)   4. RCA: FFRct 0.96 proximal, 0.82 mid, 0.75 distal (insignificant)   IMPRESSION: 1. Diffuse coronary atherosclerosis with no evidence of obstructive coronary disease.   2. Recommend aggressive risk factor modification, including LDL goal <70.   Tiffany C. Oval Linsey, MD  CARDIAC CTA: 07/29/2021 FINDINGS: A 120 kV prospective scan was triggered in the descending thoracic aorta at 111 HU's. Axial non-contrast 3 mm slices were  carried out through the heart. The data set was analyzed on a dedicated work station and scored using the Mart. Gantry rotation speed was 250 msecs and collimation was .6 mm. No beta blockade and 0.8 mg of sl NTG  was given. The 3D data set was reconstructed in 5% intervals of the 67-82 % of the R-R cycle. Diastolic phases were analyzed on a dedicated work station using MPR, MIP and VRT modes. The patient received 80 cc of contrast.   Aorta: Normal size. Ascending aorta 3.5 cm. Calcification in the aortic root and descending aorta. No dissection.   Aortic Valve:  Trileaflet.  No calcifications.   Coronary Arteries:  Normal coronary origin.  Right dominance.   RCA is a large dominant artery that gives rise to PDA and 3 PLV branches. There is diffuse mild (25-49%) calcified plaque in the proximal and mid RCA. There is minimal (<25%) plaque in the distal RCA.   Left main is a large artery that gives rise to LAD, RI, and LCX arteries. There is mild (25-49%) calcified plaque and the distal LM.   LAD is a large vessel that is diffusely diseased. There is severe (>70%) calcified plaque proximally and in the mid LAD just distal to D1. D1 has moderate (50-69%) calcified plaque.   RI is a heavily calcified vessel with moderate (50-69%) plaque proximally.   LCX is a non-dominant artery that gives rise to one large OM1 branch. There is mild (25-49%) calcified plaque proximally and minimal (<25%) calcified plaque distally. There are two small OM vessels without significant plaque.   Coronary Calcium Score:   Left main: 3.89   Left anterior descending artery: 870   Left circumflex artery: 418   Right coronary artery: 873   Total: 2165   Percentile: 99th   Other findings:   Normal pulmonary vein drainage into the left atrium.   Normal let atrial appendage without a thrombus.   Normal size of the pulmonary artery.   IMPRESSION: 1. Coronary calcium score of 2165. This was 99th percentile for age-, race-, and sex-matched controls.   2. Normal coronary origin with right dominance.   3. Diffuse coronary atherosclerosis with mild plaque in the distal LM, RCA and LCX.   4. Severe  (>70%) plaque in the LAD. However, this is heavily calcified and may be over estimated due to blooming artifact. Will send the study for FFRct.   5.  CAD-RADS 4.   6.  Atherosclerosis of the aorta.  Laboratory Data:  High Sensitivity Troponin:   Recent Labs  Lab 07/17/22 1215 07/17/22 1359 07/18/22 0459 07/18/22 0945  TROPONINIHS 62* 56* 55* 44*     Chemistry Recent Labs  Lab 07/17/22 1215 07/18/22 0459  NA 136 137  K 4.2 3.5  CL 102 102  CO2 23 26  GLUCOSE 83 78  BUN 13 11  CREATININE 1.35* 1.21*  CALCIUM 8.8* 8.5*  GFRNONAA 41* 47*  ANIONGAP 11 9    Recent Labs  Lab 07/17/22 1215  PROT 5.8*  ALBUMIN 3.0*  AST 22  ALT 13  ALKPHOS 62  BILITOT 0.8   Lipids No results for input(s): "CHOL", "TRIG", "HDL", "LABVLDL", "LDLCALC", "CHOLHDL" in the last 168 hours.  Hematology Recent Labs  Lab 07/17/22 1215 07/18/22 0459  WBC 9.3 6.4  RBC 4.10 3.50*  HGB 13.2 11.3*  HCT 38.5 33.6*  MCV 93.9 96.0  MCH 32.2 32.3  MCHC 34.3 33.6  RDW 14.2 14.6  PLT 311 284  Thyroid  Recent Labs  Lab 07/18/22 0459  TSH 1.756    BNPNo results for input(s): "BNP", "PROBNP" in the last 168 hours.  DDimer No results for input(s): "DDIMER" in the last 168 hours.   Radiology/Studies:  DG Chest Port 1 View  Result Date: 07/17/2022 CLINICAL DATA:  Dizziness, weakness EXAM: PORTABLE CHEST 1 VIEW COMPARISON:  07/28/2021 FINDINGS: The heart size and mediastinal contours are within normal limits. Both lungs are clear. There are small calcifications adjacent to the greater tuberosity in both proximal humeri. IMPRESSION: No active cardiopulmonary disease. Possible calcific bursitis in both shoulders. Electronically Signed   By: Elmer Picker M.D.   On: 07/17/2022 12:33     Assessment and Plan:   Near-syncope -Feel it is a combination of dehydration, and SVT -She got lightheaded and that was likely due to the dehydration, but her symptoms worsened most likely when the SVT  started. -Her creatinine was a bit above normal when she was admitted and has improved overnight - She is getting lactated Ringer's at 75 cc an hour - Orthostatic vital signs have not been tested - Would continue hydration for now as her baseline creatinine is somewhere between 1.10 and 1.22  2. SVT -- Converted to sinus rhythm with a dose of adenosine per EMS -Currently maintaining sinus rhythm -Had some dizziness when she was started on Toprol-XL 25 mg daily, could try half tablet -Keep appointment with Dr. Quentin Ore next week, she is agreeable to pursuing ablation   Risk Assessment/Risk Scores:         For questions or updates, please contact [place] Please consult [email or web address] for contact info under    Signed, Rosaria Ferries, PA-C  07/18/2022 1:13 PM  Attending Note:   The patient was seen and examined.  Agree with assessment and plan as noted above.  Changes made to the above note as needed.  Patient seen and independently examined with Rosaria Ferries, PA .   We discussed all aspects of the encounter. I agree with the assessment and plan as stated above.     SVT:   likely exacerbated by dehydration.  She was not eating / drinking due to her pain meds.   Encouraged her to eat and drink regularly .   Continue the PRN metoprolol. She has an appt with Dr. Quentin Ore in several weeks   2.  Volume depletion:  due to poor PO intake while on pain meds following her knee surgery .    I have spent a total of 40 minutes with patient reviewing hospital  notes , telemetry, EKGs, labs and examining patient as well as establishing an assessment and plan that was discussed with the patient.  > 50% of time was spent in direct patient care.    Thayer Headings, Brooke Bonito., MD, [facility] 07/18/2022, 2:06 PM [address]  [place] Pager [phone]

## 2022-07-18 NOTE — Progress Notes (Signed)
   07/18/22 1500  Mobility  Activity Ambulated with assistance in hallway  Level of Assistance Standby assist, set-up cues, supervision of patient - no hands on  Assistive Device Front wheel walker  Distance Ambulated (ft) 120 ft  Activity Response Tolerated well  $Mobility charge 1 Mobility   Mobility Specialist Progress Note  Received pt in chair having no complaints and agreeable to mobility. Pt was asymptomatic throughout ambulation and returned to room w/o fault. Left in chair w/ call bell in reach and all needs met.   Lucious Groves Mobility Specialist

## 2022-07-18 NOTE — Progress Notes (Addendum)
Stephanie Daniel YDX:[id number] DOB: 1951/03/12 DOA: 07/17/2022 PCP: [name]   Subj:  71 y.o. WF PMHx CAD, HTN, chronic diastolic CHF, Hypothyroidism   Presenting with near syncope.  She underwent  RIGHT TKA on 8/25 with Dr. Veverly Fells and was discharged the following day.  She was trying to get ready for PT this AM and got acutely dizzy, couldn't hold anything, just went out of it but did not lose consciousness.  She had a recent TKA and has not been eating and drinking well.  She was feeling yucky with the pain medication.  She stopped taking it yesterday and started feeling a little better.  No CP.  She felt like she was breathing heavy but did not feel SOB.  She has h/o SVT, has a medicine to take when HR >100.  She noted one episode of SVT last week.  Today's episode did not feel like her usual SVT.  She feels good now and is hungry.       ER Course:  Knee replacement and not doing well since.  N/V with pain medication, poor PO intake.  Seen by EMS, ?SVT, given adenosine, felt better.  Has elevated troponin, needs to be followed.  Cardiology will consult.  Obj: A/O x4, negative CP, negative SOB.    Objective: VITAL SIGNS: Temp: 97.6 F (36.4 C) (09/01 0751) Temp Source: Oral (09/01 0751) BP: 129/76 (09/01 0751) Pulse Rate: 73 (09/01 0751) SPO2; FIO2:   Intake/Output Summary (Last 24 hours) at 07/18/2022 0802 Last data filed at 07/17/2022 2200 Gross per 24 hour  Intake 1555.15 ml  Output 1 ml  Net 1554.15 ml     Exam: General: A/O x4 No acute respiratory distress Lungs: Clear to auscultation bilaterally without wheezes or crackles Cardiovascular: Regular rate and rhythm without murmur gallop or rub normal S1 and S2 Abdomen: Nontender, nondistended, soft, bowel sounds positive, no rebound, no ascites, no appreciable mass Extremities: No significant cyanosis, clubbing, or edema bilateral lower extremities Skin: Negative rashes, lesions,  ulcers Psychiatric:  Negative depression, negative anxiety, negative fatigue, negative mania  Central nervous system:  Cranial nerves II through XII intact, tongue/uvula midline, all extremities muscle strength 5/5, sensation intact throughout,negative dysarthria, negative expressive aphasia, negative receptive aphasia.     Mobility Assessment (last 72 hours)     Mobility Assessment     Row Name 07/17/22 2000 07/17/22 1645 07/17/22 1600       Does patient have an order for bedrest or is patient medically unstable No - Continue assessment -- No - Continue assessment     What is the highest level of mobility based on the progressive mobility assessment? Level 5 (Walks with assist in room/hall) - Balance while stepping forward/back and can walk in room with assist - Complete Level 5 (Walks with assist in room/hall) - Balance while stepping forward/back and can walk in room with assist - Complete Level 5 (Walks with assist in room/hall) - Balance while stepping forward/back and can walk in room with assist - Complete                DVT prophylaxis:  Code Status:  Family Communication:  Status is: Inpatient    Dispo: The patient is from: Home              Anticipated d/c is to: Home              Anticipated d/c date is: 1 day  Patient currently is not medically stable to d/c.    Procedures/Significant Events: 9/1 Echocardiogram   Consultants:  Cardiology   Cultures     Antimicrobials:   A/P  Near syncope -Patient with TKR last week -She reports n/v and weakness from the pain medication with poor PO intake -She stopped the medication yesterday and feels better from that standpoint but did get acutely lightheaded today -EMS was called and their tracing showed SVT which resolved with adenosine x 1 -Mildly elevated troponin with negative delta, likely related to demand ischemia -Will observe overnight on telemetry with IVF -9/1 patient has been  advised she will not be discharged today until work-up is complete.  However since patient does have capacity she may leave AMA.  Chronic diastolic CHF - Strict in and out - Daily weight - Echocardiogram pending - TSH pending - A.m. cortisol pending  CAD/Demand ischemia -Continue ASA -She did not report any CP -Troponin was minimally elevated with negative delta, low suspicion for ACS  Latest Reference Range & Units 07/17/22 13:59 07/18/22 04:59 07/18/22 09:45 07/18/22 12:15  Troponin I (High Sensitivity) <18 ng/L 56 (H) 55 (H) 44 (H) 41 (H)  (H): Data is abnormally high -Elevation of troponins most consistent with demand ischemia, however given patient's previous history of high-grade lesion in her LAD (per Dr. Rogelia Mire cardiology note 08/26/2021) work-up is prudent.   SVT -Patient reports h/o  -She takes metoprolol as needed for HR >100 -Today's episode felt different from her usual SVT   HTN -Resume home BP meds tomorrow - lisinopril, lopressor     Recent RIGHT TKA -R knee appears to be healing well -PT already established as outpatient -Will order PT/OT consults while here     Stage 3a CKD -Appears to stable, possibly marginally worse than baseline -Hydrating overnight -Would use caution with Mobic Lab Results  Component Value Date   CREATININE 1.21 (H) 07/18/2022   CREATININE 1.35 (H) 07/17/2022   CREATININE 1.21 (H) 07/01/2022   CREATININE 1.22 (H) 04/01/2022   CREATININE 1.10 (H) 07/28/2021     Hypothyroidism -Continue Synthroid   HLD -Continue atorvastatin   Mood d/o -Continue Wellbutrin        Care during the described time interval was provided by me .  I have reviewed this patient's available data, including medical history, events of note, physical examination, and all test results as part of my evaluation.

## 2022-07-18 NOTE — TOC Transition Note (Signed)
Transition of Care [facility]) - CM/SW Discharge Note   Patient Details  Name: Stephanie Daniel MRN: [id number] Date of Birth: [birth year]  [facility]) CM/SW Contact:  Zenon Mayo, RN Phone Number: 07/18/2022, 4:37 PM   Clinical Narrative:    From home alone, recent knee replacement, 8/25, she presents with a near syncope episode, also in SVT, pt recs outpatient physical therapy, patient states she has an apt on [address]  with [facility] for physical therapy.  She states her cousin will transport her home at dc.  She states she does not need any DME she has what she needs.  She uses [facility] on [address] in [place].  Her outpt physical therapy was set up when she had her knee replacement done.             Patient Goals and CMS Choice        Discharge Placement                       Discharge Plan and Services                                     Social Determinants of Health (SDOH) Interventions     Readmission Risk Interventions     No data to display

## 2022-07-18 NOTE — TOC Progression Note (Signed)
Transition of Care [facility]) - Progression Note    Patient Details  Name: Stephanie Daniel MRN: [id number] Date of Birth: [birth year]  Transition of Care [facility]) CM/SW Contact  Zenon Mayo, RN Phone Number: 07/18/2022, 4:30 PM  Clinical Narrative:    From home alone, recent knee replacement, 8/25, she presents with a near syncope episode, also in SVT, pt recs outpatient physical therapy, patient states she has an apt on [address]  with [facility] for physical therapy.  She states her cousin will transport her home at dc.  She states she does not need any DME she has what she needs.  She uses [facility] on [address] in [place].  Her outpt physical therapy was set up when she had her knee replacement done.  TOC following.         Expected Discharge Plan and Services                                                 Social Determinants of Health (SDOH) Interventions    Readmission Risk Interventions     No data to display

## 2022-07-19 DIAGNOSIS — R000 Tachycardia, unspecified: Secondary | ICD-10-CM | POA: Diagnosis not present

## 2022-07-19 DIAGNOSIS — Z96651 Presence of right artificial knee joint: Secondary | ICD-10-CM

## 2022-07-19 DIAGNOSIS — I5032 Chronic diastolic (congestive) heart failure: Secondary | ICD-10-CM | POA: Diagnosis not present

## 2022-07-19 DIAGNOSIS — I2510 Atherosclerotic heart disease of native coronary artery without angina pectoris: Secondary | ICD-10-CM | POA: Diagnosis not present

## 2022-07-19 DIAGNOSIS — R55 Syncope and collapse: Secondary | ICD-10-CM | POA: Diagnosis not present

## 2022-07-19 LAB — COMPREHENSIVE METABOLIC PANEL
ALT: 13 U/L (ref 0–44)
AST: 16 U/L (ref 15–41)
Albumin: 2.6 g/dL — ABNORMAL LOW (ref 3.5–5.0)
Alkaline Phosphatase: 51 U/L (ref 38–126)
Anion gap: 8 (ref 5–15)
BUN: 9 mg/dL (ref 8–23)
CO2: 25 mmol/L (ref 22–32)
Calcium: 8.4 mg/dL — ABNORMAL LOW (ref 8.9–10.3)
Chloride: 104 mmol/L (ref 98–111)
Creatinine, Ser: 1.01 mg/dL — ABNORMAL HIGH (ref 0.44–1.00)
GFR, Estimated: 59 mL/min — ABNORMAL LOW (ref >60)
Glucose, Bld: 80 mg/dL (ref 70–99)
Potassium: 3.5 mmol/L (ref 3.5–5.1)
Sodium: 137 mmol/L (ref 135–145)
Total Bilirubin: 0.6 mg/dL (ref 0.3–1.2)
Total Protein: 4.9 g/dL — ABNORMAL LOW (ref 6.5–8.1)

## 2022-07-19 LAB — CBC WITH DIFFERENTIAL/PLATELET
Abs Immature Granulocytes: 0.07 10*3/uL (ref 0.00–0.07)
Basophils Absolute: 0.1 10*3/uL (ref 0.0–0.1)
Basophils Relative: 1 %
Eosinophils Absolute: 0.6 10*3/uL — ABNORMAL HIGH (ref 0.0–0.5)
Eosinophils Relative: 8 %
HCT: 31.4 % — ABNORMAL LOW (ref 36.0–46.0)
Hemoglobin: 10.8 g/dL — ABNORMAL LOW (ref 12.0–15.0)
Immature Granulocytes: 1 %
Lymphocytes Relative: 25 %
Lymphs Abs: 1.7 10*3/uL (ref 0.7–4.0)
MCH: 32.2 pg (ref 26.0–34.0)
MCHC: 34.4 g/dL (ref 30.0–36.0)
MCV: 93.7 fL (ref 80.0–100.0)
Monocytes Absolute: 0.7 10*3/uL (ref 0.1–1.0)
Monocytes Relative: 10 %
Neutro Abs: 3.8 10*3/uL (ref 1.7–7.7)
Neutrophils Relative %: 55 %
Platelets: 281 10*3/uL (ref 150–400)
RBC: 3.35 MIL/uL — ABNORMAL LOW (ref 3.87–5.11)
RDW: 14.5 % (ref 11.5–15.5)
WBC: 6.9 10*3/uL (ref 4.0–10.5)
nRBC: 0 % (ref 0.0–0.2)

## 2022-07-19 LAB — PHOSPHORUS: Phosphorus: 3.7 mg/dL (ref 2.5–4.6)

## 2022-07-19 LAB — MAGNESIUM: Magnesium: 1.6 mg/dL — ABNORMAL LOW (ref 1.7–2.4)

## 2022-07-19 LAB — TSH: TSH: 0.903 u[IU]/mL (ref 0.350–4.500)

## 2022-07-19 LAB — CORTISOL: Cortisol, Plasma: 3.1 ug/dL

## 2022-07-19 MED ORDER — ENSURE ENLIVE PO LIQD
237.0000 mL | Freq: Two times a day (BID) | ORAL | Status: DC
  Administered 2022-07-19: 237 mL via ORAL

## 2022-07-19 MED ORDER — METOPROLOL TARTRATE 25 MG PO TABS: 12.5000 mg | ORAL_TABLET | Freq: Every day | ORAL | 0 refills | Status: DC | PRN

## 2022-07-19 MED ORDER — ADULT MULTIVITAMIN W/MINERALS CH
1.0000 | ORAL_TABLET | Freq: Every day | ORAL | Status: DC
  Administered 2022-07-19: 1 via ORAL
  Filled 2022-07-19: qty 1

## 2022-07-19 MED ORDER — MAGNESIUM OXIDE -MG SUPPLEMENT 400 (240 MG) MG PO TABS
400.0000 mg | ORAL_TABLET | Freq: Two times a day (BID) | ORAL | Status: DC
  Administered 2022-07-19: 400 mg via ORAL
  Filled 2022-07-19: qty 1

## 2022-07-19 MED ORDER — METOPROLOL TARTRATE 12.5 MG HALF TABLET: 12.5000 mg | ORAL_TABLET | Freq: Every day | ORAL | Status: DC | PRN

## 2022-07-19 MED ORDER — POTASSIUM CHLORIDE CRYS ER 10 MEQ PO TBCR
50.0000 meq | EXTENDED_RELEASE_TABLET | Freq: Two times a day (BID) | ORAL | Status: AC
  Administered 2022-07-19: 50 meq via ORAL
  Filled 2022-07-19: qty 1

## 2022-07-19 MED ORDER — METOPROLOL TARTRATE 12.5 MG HALF TABLET: 12.5000 mg | ORAL_TABLET | Freq: Every day | ORAL | Status: DC

## 2022-07-19 NOTE — Progress Notes (Signed)
Initial Nutrition Assessment  DOCUMENTATION CODES:   Not applicable  INTERVENTION:   Multivitamin w/ minerals daily Ensure Enlive po BID, each supplement provides 350 kcal and 20 grams of protein. Encourage good PO intake  NUTRITION DIAGNOSIS:   Increased nutrient needs related to chronic illness (CHF, CKD) as evidenced by estimated needs.  GOAL:   Patient will meet greater than or equal to 90% of their needs  MONITOR:   PO intake, Weight trends, I & O's, Labs, Supplement acceptance  REASON FOR ASSESSMENT:   Consult Other (Comment) (Nutritional Goals)  ASSESSMENT:   71 y.o. female presented to the ED with near syncope event. PMH includes HTN, CAD, CHF, CKD IIIa, and hypothyroidism. Pt admitted with near syncope, SVT, and recent TKR.   RD working remotely at time of assessment; unable to reach pt via phone in room. Meal completions: 25-75% x 3 meals  Per EMR, pt weight has remained stable over the past 6 months.   Medications reviewed and include: Zovirax, Mag-Ox, Protonix, Potassium Chloride Labs reviewed: Magnesium 1.6 (low)   NUTRITION - FOCUSED PHYSICAL EXAM:  Deferred to follow-up  Diet Order:   Diet Order             Diet regular Room service appropriate? Yes; Fluid consistency: Thin  Diet effective now                   EDUCATION NEEDS:   No education needs have been identified at this time  Skin:  Skin Assessment: Reviewed RN Assessment  Last BM:  8/31  Height:   Ht Readings from Last 1 Encounters:  07/17/22 '5\' 4"'$  (1.626 m)    Weight:   Wt Readings from Last 1 Encounters:  07/19/22 74.1 kg    Ideal Body Weight:  54.6 kg  BMI:  Body mass index is 28.05 kg/m.  Estimated Nutritional Needs:   Kcal:  1800-2000  Protein:  90-105 grams  Fluid:  >/= 1.8 L   Hermina Barters RD, LDN Clinical Dietitian See [facility] for contact information.

## 2022-07-19 NOTE — Discharge Summary (Signed)
Physician Discharge Summary  Stephanie Daniel [id number]:[id number] DOB: 1951-10-31 DOA: 07/17/2022  PCP: [name]  Admit date: 07/17/2022 Discharge date: 07/20/2022  Time spent: 35 minutes  Recommendations for Outpatient Follow-up:   Near syncope -Patient with TKR last week -She reports n/v and weakness from the pain medication with poor PO intake -She stopped the medication yesterday and feels better from that standpoint but did get acutely lightheaded today -EMS was called and their tracing showed SVT which resolved with adenosine x 1 -Mildly elevated troponin with negative delta, likely related to demand ischemia -Will observe overnight on telemetry with IVF -Negative orthostatic hypotension stable for discharge    Chronic diastolic CHF - Strict in and out +2.8 L - Daily weight Filed Weights   07/17/22 1550 07/18/22 0521 07/19/22 0533  Weight: 73 kg 73.5 kg 74.1 kg  - Echocardiogram consistent with diastolic CHF see results below - TSH 0.9 normal - A.m. cortisol 3.1 slightly low   CAD/Demand ischemia -Continue ASA -She did not report any CP -Troponin was minimally elevated with negative delta, low suspicion for ACS  Latest Reference Range & Units 07/17/22 12:15 07/17/22 13:59 07/18/22 04:59 07/18/22 09:45 07/18/22 12:15  Troponin I (High Sensitivity) <18 ng/L 62 (H) 56 (H) 55 (H) 44 (H) 41 (H)  (H): Data is abnormally high   SVT -Patient reports h/o  -She takes metoprolol as needed for HR >100 - Converted to sinus rhythm with a dose of adenosine per EMS -Currently maintaining sinus rhythm -Had some dizziness when she was started on Toprol-XL 25 mg daily, could try half tablet -Keep appointment with Dr. Quentin Ore next week, she is agreeable to pursuing ablation   HTN -See SVT     Recent RIGHT TKA -R knee appears to be healing well -PT already established as outpatient -Outpatient PT     Stage 3a CKD (baseline Cr 1.21) -Appears to stable,  possibly marginally worse than baseline -Hydrated overnight -Would use caution with Mobic Lab Results  Component Value Date   CREATININE 1.01 (H) 07/19/2022   CREATININE 1.21 (H) 07/18/2022   CREATININE 1.35 (H) 07/17/2022   CREATININE 1.21 (H) 07/01/2022   CREATININE 1.22 (H) 04/01/2022  -Resolved with hydration  Hypothyroidism -9/2 TSH= 0.9 -Synthroid 50 mcg daily   HLD -Continue atorvastatin   Mood d/o -Continue Wellbutrin  Hypokalemia - Potassium goal> 4 - Potassium 50 mEq  BID prior to discharge  Hypomagnesmia - Magnesium goal> 2 - Magnesium oxide 400 mg BID prior to discharge    Discharge Diagnoses:  Principal Problem:   Near syncope Active Problems:   Essential hypertension, benign   Hypothyroidism   CAD (coronary artery disease)   Status post total knee replacement, right   SVT (supraventricular tachycardia) (HCC)   Chronic kidney disease, stage 3a (HCC)   Chronic diastolic CHF (congestive heart failure) ([place])   Discharge Condition: Stable  Diet recommendation: Heart healthy  Filed Weights   07/17/22 1550 07/18/22 0521 07/19/22 0533  Weight: 73 kg 73.5 kg 74.1 kg    History of present illness:  71 y.o. WF PMHx CAD, HTN, chronic diastolic CHF, Hypothyroidism    Presenting with near syncope.  She underwent  RIGHT TKA on 8/25 with Dr. Veverly Fells and was discharged the following day.  She was trying to get ready for PT this AM and got acutely dizzy, couldn't hold anything, just went out of it but did not lose consciousness.  She had a recent TKA and has not been eating  and drinking well.  She was feeling yucky with the pain medication.  She stopped taking it yesterday and started feeling a little better.  No CP.  She felt like she was breathing heavy but did not feel SOB.  She has h/o SVT, has a medicine to take when HR >100.  She noted one episode of SVT last week.  Today's episode did not feel like her usual SVT.  She feels good now and is  hungry.  Hospital Course:  See above  Procedures: 9/1 Echocardiogram Left Ventricle: Left ventricular ejection fraction, by estimation, is 65  to 70%. The left ventricle has normal function. The left ventricle has no  regional wall motion abnormalities. The left ventricular internal cavity  size was normal in size. There is   no left ventricular hypertrophy. Left ventricular diastolic parameters  are consistent with Grade I diastolic dysfunction (impaired relaxation).        Consultations: Cardiology    Discharge Exam: Vitals:   07/18/22 1938 07/18/22 2359 07/19/22 0533 07/19/22 0826  BP: 137/78 133/68 (!) 106/51 (!) 145/79  Pulse: 76 75 72 75  Resp: '18 18 18 18  '$ Temp: 98.1 F (36.7 C) 98.1 F (36.7 C) 98 F (36.7 C) 97.7 F (36.5 C)  TempSrc: Oral Oral Oral Oral  SpO2: 95% 95% 90% 96%  Weight:   74.1 kg   Height:        General: A/O x4 No acute respiratory distress Lungs: Clear to auscultation bilaterally without wheezes or crackles Cardiovascular: Regular rate and rhythm without murmur gallop or rub normal S1 and S2    Discharge Instructions   Allergies as of 07/19/2022       Reactions   Albuterol Other (See Comments)   Respiratory distress   Oxycodone Nausea And Vomiting        Medication List     STOP taking these medications    meloxicam 15 MG tablet Commonly known as: MOBIC   oxyCODONE-acetaminophen 5-325 MG tablet Commonly known as: Percocet       TAKE these medications    acyclovir 400 MG tablet Commonly known as: ZOVIRAX Take 400 mg by mouth 2 (two) times daily.   aspirin EC 81 MG tablet Take 1 tablet (81 mg total) by mouth 2 (two) times daily. Swallow whole.   atorvastatin 40 MG tablet Commonly known as: LIPITOR Take 1 tablet (40 mg total) by mouth daily.   buPROPion 150 MG 24 hr tablet Commonly known as: WELLBUTRIN XL Take 150 mg by mouth daily.   gabapentin 300 MG capsule Commonly known as: NEURONTIN Take 300 mg by  mouth at bedtime.   HYDROcodone-acetaminophen 5-325 MG tablet Commonly known as: NORCO/VICODIN Take 1 tablet by mouth every 6 (six) hours as needed for moderate pain.   levothyroxine 50 MCG tablet Commonly known as: SYNTHROID Take 50 mcg by mouth daily before breakfast.   lisinopril 20 MG tablet Commonly known as: ZESTRIL Take 1 tablet (20 mg total) by mouth daily.   methocarbamol 500 MG tablet Commonly known as: ROBAXIN Take 1 tablet (500 mg total) by mouth every 8 (eight) hours as needed for muscle spasms.   metoprolol tartrate 25 MG tablet Commonly known as: LOPRESSOR Take 0.5 tablets (12.5 mg total) by mouth daily as needed (HR>100). What changed:  how much to take reasons to take this   nitroGLYCERIN 0.4 MG SL tablet Commonly known as: NITROSTAT Place 1 tablet (0.4 mg total) under the tongue every 5 (five) minutes x 3  doses as needed for chest pain.   omeprazole 40 MG capsule Commonly known as: PRILOSEC Take 40 mg by mouth daily.   ondansetron 4 MG tablet Commonly known as: Zofran Take 1 tablet (4 mg total) by mouth every 8 (eight) hours as needed for nausea, vomiting or refractory nausea / vomiting.   Potassium 99 MG Tabs Take 2 tablets by mouth at bedtime.       Allergies  Allergen Reactions   Albuterol Other (See Comments)    Respiratory distress   Oxycodone Nausea And Vomiting      The results of significant diagnostics from this hospitalization (including imaging, microbiology, ancillary and laboratory) are listed below for reference.    Significant Diagnostic Studies: ECHOCARDIOGRAM COMPLETE  Result Date: 07/18/2022    ECHOCARDIOGRAM REPORT   Patient Name:   Stephanie Daniel Date of Exam: 07/18/2022 Medical Rec #:  [id number]      Height:       64.0 in Accession #:    [id number]     Weight:       162.0 lb Date of Birth:  11/14/1951      BSA:          1.789 m Patient Age:    9 years       BP:           129/76 mmHg Patient Gender: F              HR:            80 bpm. Exam Location:  Inpatient Procedure: 2D Echo, Cardiac Doppler and Color Doppler Indications:    Congestive heart failure  History:        Patient has prior history of Echocardiogram examinations, most                 recent 07/29/2021. CAD; Risk Factors:Hypertension and                 Dyslipidemia.  Sonographer:    Jefferey Pica Referring Phys: [id number] [place]  1. Left ventricular ejection fraction, by estimation, is 65 to 70%. The left ventricle has normal function. The left ventricle has no regional wall motion abnormalities. Left ventricular diastolic parameters are consistent with Grade I diastolic dysfunction (impaired relaxation).  2. Right ventricular systolic function is normal. The right ventricular size is normal.  3. Left atrial size was mildly dilated.  4. The mitral valve is normal in structure. Trivial mitral valve regurgitation. No evidence of mitral stenosis.  5. The aortic valve is normal in structure. There is mild calcification of the aortic valve. Aortic valve regurgitation is trivial. No aortic stenosis is present.  6. The inferior vena cava is normal in size with greater than 50% respiratory variability, suggesting right atrial pressure of 3 mmHg.  7. There was prominent anterior fat pad. FINDINGS  Left Ventricle: Left ventricular ejection fraction, by estimation, is 65 to 70%. The left ventricle has normal function. The left ventricle has no regional wall motion abnormalities. The left ventricular internal cavity size was normal in size. There is  no left ventricular hypertrophy. Left ventricular diastolic parameters are consistent with Grade I diastolic dysfunction (impaired relaxation). Right Ventricle: The right ventricular size is normal. No increase in right ventricular wall thickness. Right ventricular systolic function is normal. Left Atrium: Left atrial size was mildly dilated. Right Atrium: Right atrial size was normal in size. Pericardium: There is no  evidence of pericardial effusion. Presence of epicardial  fat layer. Mitral Valve: The mitral valve is normal in structure. Trivial mitral valve regurgitation. No evidence of mitral valve stenosis. Tricuspid Valve: The tricuspid valve is normal in structure. Tricuspid valve regurgitation is trivial. No evidence of tricuspid stenosis. Aortic Valve: The aortic valve is normal in structure. There is mild calcification of the aortic valve. Aortic valve regurgitation is trivial. No aortic stenosis is present. Aortic valve peak gradient measures 8.1 mmHg. Pulmonic Valve: The pulmonic valve was normal in structure. Pulmonic valve regurgitation is not visualized. No evidence of pulmonic stenosis. Aorta: The aortic root is normal in size and structure. Venous: The inferior vena cava is normal in size with greater than 50% respiratory variability, suggesting right atrial pressure of 3 mmHg. IAS/Shunts: No atrial level shunt detected by color flow Doppler.  LEFT VENTRICLE PLAX 2D LVIDd:         4.00 cm   Diastology LVIDs:         1.60 cm   LV e' medial:    5.33 cm/s LV PW:         1.00 cm   LV E/e' medial:  12.7 LV IVS:        1.00 cm   LV e' lateral:   8.81 cm/s LVOT diam:     2.00 cm   LV E/e' lateral: 7.7 LV SV:         82 LV SV Index:   46 LVOT Area:     3.14 cm  RIGHT VENTRICLE             IVC RV S prime:     17.40 cm/s  IVC diam: 1.30 cm LEFT ATRIUM             Index        RIGHT ATRIUM           Index LA diam:        2.90 cm 1.62 cm/m   RA Area:     11.30 cm LA Vol (A2C):   37.7 ml 21.08 ml/m  RA Volume:   24.70 ml  13.81 ml/m LA Vol (A4C):   42.3 ml 23.65 ml/m LA Biplane Vol: 39.8 ml 22.25 ml/m  AORTIC VALVE                 PULMONIC VALVE AV Area (Vmax): 3.20 cm     PV Vmax:       1.15 m/s AV Vmax:        142.50 cm/s  PV Peak grad:  5.3 mmHg AV Peak Grad:   8.1 mmHg LVOT Vmax:      145.00 cm/s LVOT Vmean:     86.900 cm/s LVOT VTI:       0.262 m  AORTA Ao Root diam: 3.20 cm Ao Asc diam:  3.60 cm MITRAL VALVE MV  Area (PHT): 2.69 cm     SHUNTS MV Decel Time: 282 msec     Systemic VTI:  0.26 m MV E velocity: 67.60 cm/s   Systemic Diam: 2.00 cm MV A velocity: 101.00 cm/s MV E/A ratio:  0.67 Glori Bickers MD Electronically signed by Glori Bickers MD Signature Date/Time: 07/18/2022/4:48:32 PM    Final    DG Chest Port 1 View  Result Date: 07/17/2022 CLINICAL DATA:  Dizziness, weakness EXAM: PORTABLE CHEST 1 VIEW COMPARISON:  07/28/2021 FINDINGS: The heart size and mediastinal contours are within normal limits. Both lungs are clear. There are small calcifications adjacent to the greater tuberosity in both proximal humeri.  IMPRESSION: No active cardiopulmonary disease. Possible calcific bursitis in both shoulders. Electronically Signed   By: Elmer Picker M.D.   On: 07/17/2022 12:33    Microbiology: No results found for this or any previous visit (from the past 240 hour(s)).   Labs: Basic Metabolic Panel: Recent Labs  Lab 07/17/22 1215 07/18/22 0459 07/19/22 0302  NA 136 137 137  K 4.2 3.5 3.5  CL 102 102 104  CO2 '23 26 25  '$ GLUCOSE 83 78 80  BUN '13 11 9  '$ CREATININE 1.35* 1.21* 1.01*  CALCIUM 8.8* 8.5* 8.4*  MG  --   --  1.6*  PHOS  --   --  3.7   Liver Function Tests: Recent Labs  Lab 07/17/22 1215 07/19/22 0302  AST 22 16  ALT 13 13  ALKPHOS 62 51  BILITOT 0.8 0.6  PROT 5.8* 4.9*  ALBUMIN 3.0* 2.6*   No results for input(s): "LIPASE", "AMYLASE" in the last 168 hours. No results for input(s): "AMMONIA" in the last 168 hours. CBC: Recent Labs  Lab 07/17/22 1215 07/18/22 0459 07/19/22 0302  WBC 9.3 6.4 6.9  NEUTROABS  --   --  3.8  HGB 13.2 11.3* 10.8*  HCT 38.5 33.6* 31.4*  MCV 93.9 96.0 93.7  PLT 311 284 281   Cardiac Enzymes: No results for input(s): "CKTOTAL", "CKMB", "CKMBINDEX", "TROPONINI" in the last 168 hours. BNP: BNP (last 3 results) No results for input(s): "BNP" in the last 8760 hours.  ProBNP (last 3 results) No results for input(s): "PROBNP" in  the last 8760 hours.  CBG: No results for input(s): "GLUCAP" in the last 168 hours.     Signed:  Dia Crawford, MD [facility]

## 2022-07-19 NOTE — Progress Notes (Signed)
Physical Therapy Treatment Patient Details Name: Stephanie Daniel MRN: [id number] DOB: 08-08-1951 Today's Date: 07/19/2022   History of Present Illness 70 yo female with recent knee replacement who presented with lightheadedness, near syncope, and svt.  PMH includes L TKR, CAD, HTN.    PT Comments    Patient eager to discharge home. Patient ambulating 200' with supervision and use of RW. Able to perform seated R knee flexion with assistance of L LE to 90 degrees. Encouraged continued performance of HEP to strengthen R LE. D/c plan remains appropriate.     Recommendations for follow up therapy are one component of a multi-disciplinary discharge planning process, led by the attending physician.  Recommendations may be updated based on patient status, additional functional criteria and insurance authorization.  Follow Up Recommendations  Outpatient PT     Assistance Recommended at Discharge Intermittent Supervision/Assistance  Patient can return home with the following A little help with walking and/or transfers;A little help with bathing/dressing/bathroom;Assistance with cooking/housework;Assist for transportation;Help with stairs or ramp for entrance   Equipment Recommendations  None recommended by PT    Recommendations for Other Services       Precautions / Restrictions Precautions Precautions: Fall;Knee Restrictions Weight Bearing Restrictions: Yes RLE Weight Bearing: Weight bearing as tolerated     Mobility  Bed Mobility Overal bed mobility: Modified Independent             General bed mobility comments: good use of LLE and UEs to maneuver R LE off bed    Transfers Overall transfer level: Modified independent Equipment used: Rolling Stephanie Daniel (2 wheels)                    Ambulation/Gait Ambulation/Gait assistance: Supervision Gait Distance (Feet): 200 Feet Assistive device: Rolling Damonique Brunelle (2 wheels) Gait Pattern/deviations: Step-through pattern Gait  velocity: decreased     General Gait Details: cues for heel strike. patient keeps R foot externally rotated during ambulation. Cues to attempt pointing toes forward   Stairs             Wheelchair Mobility    Modified Rankin (Stroke Patients Only)       Balance Overall balance assessment: Needs assistance Sitting-balance support: No upper extremity supported, Feet supported Sitting balance-Leahy Scale: Good     Standing balance support: Bilateral upper extremity supported, Reliant on assistive device for balance Standing balance-Leahy Scale: Poor                              Cognition Arousal/Alertness: Awake/alert Behavior During Therapy: WFL for tasks assessed/performed Overall Cognitive Status: Within Functional Limits for tasks assessed                                          Exercises Other Exercises Other Exercises: L LE assisted R knee flexion seated EOB x 3    General Comments        Pertinent Vitals/Pain Pain Assessment Pain Assessment: Faces Faces Pain Scale: Hurts a little bit Pain Location: R knee Pain Descriptors / Indicators: Sore Pain Intervention(s): Monitored during session    Home Living                          Prior Function            PT Goals (  current goals can now be found in the care plan section) Acute Rehab PT Goals Patient Stated Goal: to go home PT Goal Formulation: With patient Time For Goal Achievement: 08/01/22 Potential to Achieve Goals: Good Progress towards PT goals: Progressing toward goals    Frequency    Min 5X/week      PT Plan Current plan remains appropriate    Co-evaluation              AM-PAC PT "6 Clicks" Mobility   Outcome Measure  Help needed turning from your back to your side while in a flat bed without using bedrails?: None Help needed moving from lying on your back to sitting on the side of a flat bed without using bedrails?: None Help  needed moving to and from a bed to a chair (including a wheelchair)?: None Help needed standing up from a chair using your arms (e.g., wheelchair or bedside chair)?: None Help needed to walk in hospital room?: A Little Help needed climbing 3-5 steps with a railing? : A Little 6 Click Score: 22    End of Session   Activity Tolerance: Patient tolerated treatment well Patient left: in bed;with call bell/phone within reach Nurse Communication: Mobility status PT Visit Diagnosis: Difficulty in walking, not elsewhere classified (R26.2);Muscle weakness (generalized) (M62.81);Pain;Unsteadiness on feet (R26.81) Pain - Right/Left: Right Pain - part of body: Knee     Time: 6948-5462 PT Time Calculation (min) (ACUTE ONLY): 14 min  Charges:  $Therapeutic Activity: 8-22 mins                     Denetria Luevanos A. Gilford Rile PT, DPT Acute Rehabilitation Services Office [phone]    Linna Hoff 07/19/2022, 9:41 AM

## 2022-07-19 NOTE — Progress Notes (Signed)
Patient given discharge instructions. PIV removed. Telemetry box removed, CCMD notified. Patient taken to vehicle in wheelchair by staff.  Daymon Larsen, RN

## 2022-07-19 NOTE — Progress Notes (Signed)
Rounding Note    Patient Name: Stephanie Daniel Date of Encounter: 07/19/2022  [place] Cardiologist: Evalina Field, MD   Subjective   Feels well, wants to go home.  Inpatient Medications    Scheduled Meds:  acyclovir  400 mg Oral BID   aspirin EC  81 mg Oral BID   atorvastatin  40 mg Oral Daily   buPROPion  150 mg Oral Daily   enoxaparin (LOVENOX) injection  40 mg Subcutaneous Q24H   feeding supplement  237 mL Oral BID BM   gabapentin  300 mg Oral QHS   levothyroxine  50 mcg Oral QAC breakfast   lisinopril  20 mg Oral Daily   magnesium oxide  400 mg Oral BID   multivitamin with minerals  1 tablet Oral Daily   pantoprazole  40 mg Oral Daily   sodium chloride flush  3 mL Intravenous Q12H   Continuous Infusions:  PRN Meds: acetaminophen **OR** acetaminophen, hydrALAZINE, methocarbamol, metoprolol tartrate, morphine injection, ondansetron **OR** ondansetron (ZOFRAN) IV, oxyCODONE   Vital Signs    Vitals:   07/18/22 1938 07/18/22 2359 07/19/22 0533 07/19/22 0826  BP: 137/78 133/68 (!) 106/51 (!) 145/79  Pulse: 76 75 72 75  Resp: '18 18 18 18  '$ Temp: 98.1 F (36.7 C) 98.1 F (36.7 C) 98 F (36.7 C) 97.7 F (36.5 C)  TempSrc: Oral Oral Oral Oral  SpO2: 95% 95% 90% 96%  Weight:   74.1 kg   Height:        Intake/Output Summary (Last 24 hours) at 07/19/2022 1148 Last data filed at 07/19/2022 0837 Gross per 24 hour  Intake 1913.44 ml  Output 900 ml  Net 1013.44 ml      07/19/2022    5:33 AM 07/18/2022    5:21 AM 07/17/2022    3:50 PM  Last 3 Weights  Weight (lbs) 163 lb 6.4 oz 162 lb 160 lb 15 oz  Weight (kg) 74.118 kg 73.483 kg 73 kg      Telemetry    SR - Personally Reviewed  ECG    No new - Personally Reviewed  Physical Exam   GEN: No acute distress.   Neck: No JVD Cardiac: RRR, no murmurs, rubs, or gallops.  Respiratory: Clear to auscultation bilaterally. GI: Soft, nontender, non-distended  MS: No edema; No deformity. Neuro:   Nonfocal  Psych: Normal affect   Labs    High Sensitivity Troponin:   Recent Labs  Lab 07/17/22 1215 07/17/22 1359 07/18/22 0459 07/18/22 0945 07/18/22 1215  TROPONINIHS 62* 56* 55* 44* 41*     Chemistry Recent Labs  Lab 07/17/22 1215 07/18/22 0459 07/19/22 0302  NA 136 137 137  K 4.2 3.5 3.5  CL 102 102 104  CO2 '23 26 25  '$ GLUCOSE 83 78 80  BUN '13 11 9  '$ CREATININE 1.35* 1.21* 1.01*  CALCIUM 8.8* 8.5* 8.4*  MG  --   --  1.6*  PROT 5.8*  --  4.9*  ALBUMIN 3.0*  --  2.6*  AST 22  --  16  ALT 13  --  13  ALKPHOS 62  --  51  BILITOT 0.8  --  0.6  GFRNONAA 41* 47* 59*  ANIONGAP '11 9 8    '$ Lipids No results for input(s): "CHOL", "TRIG", "HDL", "LABVLDL", "LDLCALC", "CHOLHDL" in the last 168 hours.  Hematology Recent Labs  Lab 07/17/22 1215 07/18/22 0459 07/19/22 0302  WBC 9.3 6.4 6.9  RBC 4.10 3.50* 3.35*  HGB 13.2 11.3* 10.8*  HCT 38.5 33.6* 31.4*  MCV 93.9 96.0 93.7  MCH 32.2 32.3 32.2  MCHC 34.3 33.6 34.4  RDW 14.2 14.6 14.5  PLT 311 284 281   Thyroid  Recent Labs  Lab 07/19/22 0302  TSH 0.903    BNPNo results for input(s): "BNP", "PROBNP" in the last 168 hours.  DDimer No results for input(s): "DDIMER" in the last 168 hours.   Radiology    ECHOCARDIOGRAM COMPLETE  Result Date: 07/18/2022    ECHOCARDIOGRAM REPORT   Patient Name:   Stephanie Daniel Date of Exam: 07/18/2022 Medical Rec #:  [id number]      Height:       64.0 in Accession #:    [id number]     Weight:       162.0 lb Date of Birth:  [birth year]      BSA:          1.789 m Patient Age:    71 years       BP:           129/76 mmHg Patient Gender: F              HR:           80 bpm. Exam Location:  Inpatient Procedure: 2D Echo, Cardiac Doppler and Color Doppler Indications:    Congestive heart failure  History:        Patient has prior history of Echocardiogram examinations, most                 recent 07/29/2021. CAD; Risk Factors:Hypertension and                 Dyslipidemia.  Sonographer:    Jefferey Pica Referring Phys: [id number] [place]  1. Left ventricular ejection fraction, by estimation, is 65 to 70%. The left ventricle has normal function. The left ventricle has no regional wall motion abnormalities. Left ventricular diastolic parameters are consistent with Grade I diastolic dysfunction (impaired relaxation).  2. Right ventricular systolic function is normal. The right ventricular size is normal.  3. Left atrial size was mildly dilated.  4. The mitral valve is normal in structure. Trivial mitral valve regurgitation. No evidence of mitral stenosis.  5. The aortic valve is normal in structure. There is mild calcification of the aortic valve. Aortic valve regurgitation is trivial. No aortic stenosis is present.  6. The inferior vena cava is normal in size with greater than 50% respiratory variability, suggesting right atrial pressure of 3 mmHg.  7. There was prominent anterior fat pad. FINDINGS  Left Ventricle: Left ventricular ejection fraction, by estimation, is 65 to 70%. The left ventricle has normal function. The left ventricle has no regional wall motion abnormalities. The left ventricular internal cavity size was normal in size. There is  no left ventricular hypertrophy. Left ventricular diastolic parameters are consistent with Grade I diastolic dysfunction (impaired relaxation). Right Ventricle: The right ventricular size is normal. No increase in right ventricular wall thickness. Right ventricular systolic function is normal. Left Atrium: Left atrial size was mildly dilated. Right Atrium: Right atrial size was normal in size. Pericardium: There is no evidence of pericardial effusion. Presence of epicardial fat layer. Mitral Valve: The mitral valve is normal in structure. Trivial mitral valve regurgitation. No evidence of mitral valve stenosis. Tricuspid Valve: The tricuspid valve is normal in structure. Tricuspid valve regurgitation is trivial. No evidence of tricuspid stenosis.  Aortic Valve: The aortic valve  is normal in structure. There is mild calcification of the aortic valve. Aortic valve regurgitation is trivial. No aortic stenosis is present. Aortic valve peak gradient measures 8.1 mmHg. Pulmonic Valve: The pulmonic valve was normal in structure. Pulmonic valve regurgitation is not visualized. No evidence of pulmonic stenosis. Aorta: The aortic root is normal in size and structure. Venous: The inferior vena cava is normal in size with greater than 50% respiratory variability, suggesting right atrial pressure of 3 mmHg. IAS/Shunts: No atrial level shunt detected by color flow Doppler.  LEFT VENTRICLE PLAX 2D LVIDd:         4.00 cm   Diastology LVIDs:         1.60 cm   LV e' medial:    5.33 cm/s LV PW:         1.00 cm   LV E/e' medial:  12.7 LV IVS:        1.00 cm   LV e' lateral:   8.81 cm/s LVOT diam:     2.00 cm   LV E/e' lateral: 7.7 LV SV:         82 LV SV Index:   46 LVOT Area:     3.14 cm  RIGHT VENTRICLE             IVC RV S prime:     17.40 cm/s  IVC diam: 1.30 cm LEFT ATRIUM             Index        RIGHT ATRIUM           Index LA diam:        2.90 cm 1.62 cm/m   RA Area:     11.30 cm LA Vol (A2C):   37.7 ml 21.08 ml/m  RA Volume:   24.70 ml  13.81 ml/m LA Vol (A4C):   42.3 ml 23.65 ml/m LA Biplane Vol: 39.8 ml 22.25 ml/m  AORTIC VALVE                 PULMONIC VALVE AV Area (Vmax): 3.20 cm     PV Vmax:       1.15 m/s AV Vmax:        142.50 cm/s  PV Peak grad:  5.3 mmHg AV Peak Grad:   8.1 mmHg LVOT Vmax:      145.00 cm/s LVOT Vmean:     86.900 cm/s LVOT VTI:       0.262 m  AORTA Ao Root diam: 3.20 cm Ao Asc diam:  3.60 cm MITRAL VALVE MV Area (PHT): 2.69 cm     SHUNTS MV Decel Time: 282 msec     Systemic VTI:  0.26 m MV E velocity: 67.60 cm/s   Systemic Diam: 2.00 cm MV A velocity: 101.00 cm/s MV E/A ratio:  0.67 Glori Bickers MD Electronically signed by Glori Bickers MD Signature Date/Time: 07/18/2022/4:48:32 PM    Final    DG Chest Port 1 View  Result Date:  07/17/2022 CLINICAL DATA:  Dizziness, weakness EXAM: PORTABLE CHEST 1 VIEW COMPARISON:  07/28/2021 FINDINGS: The heart size and mediastinal contours are within normal limits. Both lungs are clear. There are small calcifications adjacent to the greater tuberosity in both proximal humeri. IMPRESSION: No active cardiopulmonary disease. Possible calcific bursitis in both shoulders. Electronically Signed   By: Elmer Picker M.D.   On: 07/17/2022 12:33    Cardiac Studies   Echo as above  Patient Profile     71 y.o. female  with a hx of mod CAD w/ neg FFR 07/2021, SVT, OA s/p R TKR 07/11/2022, HTN, hypothyroid, who is being seen for the evaluation of near-syncope and SVT, felt to have dehydration.   Assessment & Plan    Principal Problem:   Near syncope Active Problems:   Essential hypertension, benign   Hypothyroidism   CAD (coronary artery disease)   Status post total knee replacement, right   SVT (supraventricular tachycardia) (HCC)   Chronic kidney disease, stage 3a (HCC)   Chronic diastolic CHF (congestive heart failure) (HCC)  SVT - no recurrence on tele. Responded well to hydration IV. Encouraged po intake. - continue prn metoprolol homegoing. EP appt coming up. -echo with no acute changes.   [place] will sign off.  D/w Dr. Sherral Hammers.   For questions or updates, please contact [place] Please consult [email or web address] for contact info under        Signed, Elouise Munroe, MD  07/19/2022, 11:48 AM

## 2022-07-23 DIAGNOSIS — M25661 Stiffness of right knee, not elsewhere classified: Secondary | ICD-10-CM | POA: Diagnosis not present

## 2022-07-23 DIAGNOSIS — M25561 Pain in right knee: Secondary | ICD-10-CM | POA: Diagnosis not present

## 2022-07-24 DIAGNOSIS — Z4789 Encounter for other orthopedic aftercare: Secondary | ICD-10-CM | POA: Diagnosis not present

## 2022-07-25 DIAGNOSIS — M25561 Pain in right knee: Secondary | ICD-10-CM | POA: Diagnosis not present

## 2022-07-25 DIAGNOSIS — M25661 Stiffness of right knee, not elsewhere classified: Secondary | ICD-10-CM | POA: Diagnosis not present

## 2022-07-27 NOTE — Progress Notes (Unsigned)
Electrophysiology Office Note:    Date:  07/27/2022   ID:  Stephanie Daniel, DOB 05-08-1951, MRN [id number]  PCP:  [facility] Cardiologist:  Evalina Field, MD  [facility] Electrophysiologist:  None   Referring MD: Geralynn Rile, *   Chief Complaint: SVT  History of Present Illness:    Stephanie Daniel is a 71 y.o. female who presents for an evaluation of SVT at the request of Dr. Audie Box. Their medical history includes coronary artery disease, hypertension, hyperlipidemia and SVT.  The patient last saw Dr. Audie Box May 14, 2022.  She experiences prolonged episodes of rapid heartbeats.  They are occurring every few weeks and last up to 1-1/2 hours.  She has not tolerated metoprolol in the past due to fatigue.  The patient was recently admitted August 31 through September 3 with SVT shortly after a total knee replacement on the right side.    Past Medical History:  Diagnosis Date   Arthritis    Coronary artery disease    GERD (gastroesophageal reflux disease)    History of kidney stones    Hypertension    Hypothyroidism     Past Surgical History:  Procedure Laterality Date   ABDOMINAL HYSTERECTOMY     APPENDECTOMY     KNEE SURGERY  11/18/2011   TONSILLECTOMY     TOTAL KNEE ARTHROPLASTY Left 05/04/2017   Procedure: LEFT TOTAL KNEE ARTHROPLASTY;  Surgeon: Paralee Cancel, MD;  Location: [facility];  Service: Orthopedics;  Laterality: Left;  90 mins   TOTAL KNEE ARTHROPLASTY Right 07/11/2022   Procedure: TOTAL KNEE ARTHROPLASTY;  Surgeon: Netta Cedars, MD;  Location: [facility];  Service: Orthopedics;  Laterality: Right;    Current Medications: No outpatient medications have been marked as taking for the 07/28/22 encounter (Appointment) with Vickie Epley, MD.     Allergies:   Albuterol and Oxycodone   Social History   Socioeconomic History   Marital status: Divorced    Spouse name: Not on file   Number of children: Not on  file   Years of education: Not on file   Highest education level: Not on file  Occupational History   Occupation: retired  Tobacco Use   Smoking status: Former    Years: 20.00    Types: Cigarettes    Quit date: 04/28/1989    Years since quitting: 33.2   Smokeless tobacco: Never  Vaping Use   Vaping Use: Never used  Substance and Sexual Activity   Alcohol use: Yes    Comment: Occas   Drug use: No   Sexual activity: Not on file  Other Topics Concern   Not on file  Social History Narrative   Not on file   Social Determinants of Health   Financial Resource Strain: Not on file  Food Insecurity: Not on file  Transportation Needs: Not on file  Physical Activity: Not on file  Stress: Not on file  Social Connections: Not on file     Family History: The patient's family history includes COPD in her brother and mother; Cancer in her father; Heart disease in her father and mother; Hypertension in her son.  ROS:   Please see the history of present illness.    All other systems reviewed and are negative.  EKGs/Labs/Other Studies Reviewed:    The following studies were reviewed today:  July 18, 2022 echo EF 65 RV normal Mildly dilated left atrium Trivial MR  May 23, 2022 SPECT  No reversible ischemia  April 30, 2022 ZIO monitor personally reviewed  26 SVT episodes, longest 2 hours 40 minutes.  Heart rates up to 210 bpm during SVT.  July 17, 2022 EKG personally reviewed shows sinus rhythm.  No preexcitation.  rSR'in V1.   Recent Labs: 07/19/2022: ALT 13; BUN 9; Creatinine, Ser 1.01; Hemoglobin 10.8; Magnesium 1.6; Platelets 281; Potassium 3.5; Sodium 137; TSH 0.903  Recent Lipid Panel    Component Value Date/Time   CHOL 161 09/10/2021 1018   TRIG 77 09/10/2021 1018   HDL 78 09/10/2021 1018   CHOLHDL 2.1 09/10/2021 1018   CHOLHDL 2.7 07/29/2021 0610   VLDL 16 07/29/2021 0610   LDLCALC 68 09/10/2021 1018    Physical Exam:    VS:  There were no vitals taken  for this visit.    Wt Readings from Last 3 Encounters:  07/19/22 163 lb 6.4 oz (74.1 kg)  07/11/22 160 lb 6.4 oz (72.8 kg)  05/23/22 164 lb (74.4 kg)     GEN: *** Well nourished, well developed in no acute distress HEENT: Normal NECK: No JVD; No carotid bruits LYMPHATICS: No lymphadenopathy CARDIAC: ***RRR, no murmurs, rubs, gallops RESPIRATORY:  Clear to auscultation without rales, wheezing or rhonchi  ABDOMEN: Soft, non-tender, non-distended MUSCULOSKELETAL:  No edema; No deformity  SKIN: Warm and dry NEUROLOGIC:  Alert and oriented x 3 PSYCHIATRIC:  Normal affect       ASSESSMENT:    1. SVT (supraventricular tachycardia) ([place])   2. Chronic diastolic CHF (congestive heart failure) ([place])   3. Primary hypertension    PLAN:    In order of problems listed above:  #SVT Recurrent. Therapeutic strategies for supraventricular tachycardia including medicine and ablation were discussed in detail with the patient today. Risk, benefits, and alternatives to EP study and radiofrequency ablation were also discussed in detail today. These risks include but are not limited to stroke, bleeding, vascular damage, tamponade, perforation, damage to the heart and other structures, AV block requiring pacemaker, worsening renal function, and death. The patient understands these risk and wishes to proceed.  We will therefore proceed with catheter ablation at the next available time.  Hold metoprolol for 5 days prior to the procedure.  #Chronic diastolic heart failure  #Coronary artery disease  #Hypertension *** goal today.  Recommend checking blood pressures 1-2 times per week at home and recording the values.  Recommend bringing these recordings to the primary care physician.       Total time spent with patient today *** minutes. This includes reviewing records, evaluating the patient and coordinating care.  Medication Adjustments/Labs and Tests Ordered: Current medicines are reviewed  at length with the patient today.  Concerns regarding medicines are outlined above.  No orders of the defined types were placed in this encounter.  No orders of the defined types were placed in this encounter.    Signed, Hilton Cork. Quentin Ore, MD, [facility], [facility] 07/27/2022 9:23 PM    Electrophysiology [facility]

## 2022-07-28 ENCOUNTER — Encounter: Payer: Self-pay | Admitting: Cardiology

## 2022-07-28 ENCOUNTER — Ambulatory Visit: Payer: Medicare Other | Attending: Cardiology | Admitting: Cardiology

## 2022-07-28 ENCOUNTER — Encounter: Payer: Self-pay | Admitting: *Deleted

## 2022-07-28 VITALS — BP 120/78 | HR 80 | Ht 64.0 in | Wt 159.4 lb

## 2022-07-28 DIAGNOSIS — Z01818 Encounter for other preprocedural examination: Secondary | ICD-10-CM | POA: Insufficient documentation

## 2022-07-28 DIAGNOSIS — I10 Essential (primary) hypertension: Secondary | ICD-10-CM | POA: Diagnosis not present

## 2022-07-28 DIAGNOSIS — I5032 Chronic diastolic (congestive) heart failure: Secondary | ICD-10-CM | POA: Insufficient documentation

## 2022-07-28 DIAGNOSIS — I471 Supraventricular tachycardia: Secondary | ICD-10-CM | POA: Diagnosis not present

## 2022-07-28 NOTE — Patient Instructions (Signed)
Medication Instructions:  none *If you need a refill on your cardiac medications before your next appointment, please call your pharmacy*   Lab Work: CBC, BMP  If you have labs (blood work) drawn today and your tests are completely normal, you will receive your results only by: MyChart Message (if you have MyChart) OR A paper copy in the mail If you have any lab test that is abnormal or we need to change your treatment, we will call you to review the results.   Testing/Procedures: Your physician has recommended that you have an ablation. Catheter ablation is a medical procedure used to treat some cardiac arrhythmias (irregular heartbeats). During catheter ablation, a long, thin, flexible tube is put into a blood vessel in your groin (upper thigh), or neck. This tube is called an ablation catheter. It is then guided to your heart through the blood vessel. Radio frequency waves destroy small areas of heart tissue where abnormal heartbeats may cause an arrhythmia to start. Please see the instruction sheet given to you today.   Follow-Up: At [facility], you and your health needs are our priority.  As part of our continuing mission to provide you with exceptional heart care, we have created designated Provider Care Teams.  These Care Teams include your primary Cardiologist (physician) and Advanced Practice Providers (APPs -  Physician Assistants and Nurse Practitioners) who all work together to provide you with the care you need, when you need it.  We recommend signing up for the patient portal called "MyChart".  Sign up information is provided on this After Visit Summary.  MyChart is used to connect with patients for Virtual Visits (Telemedicine).  Patients are able to view lab/test results, encounter notes, upcoming appointments, etc.  Non-urgent messages can be sent to your provider as well.   To learn more about what you can do with MyChart, go to [email or web address].    Your next  appointment:   See instruction letter.  Important Information About Sugar       

## 2022-07-28 NOTE — H&P (View-Only) (Signed)
Electrophysiology Office Note:    Date:  07/28/2022   ID:  Stephanie Daniel, DOB [birth year], MRN [id number]  PCP:  Pa, [facility] Cardiologist:  Evalina Field, MD  [facility] Electrophysiologist:  None   Referring MD: Geralynn Rile, *   Chief Complaint: SVT  History of Present Illness:    Stephanie Daniel is a 71 y.o. female who presents for an evaluation of SVT at the request of Dr. Audie Box. Their medical history includes coronary artery disease, hypertension, hyperlipidemia and SVT.  The patient last saw Dr. Audie Box May 14, 2022.  She experiences prolonged episodes of rapid heartbeats.  They are occurring every few weeks and last up to 1-1/2 hours.  She has not tolerated metoprolol in the past due to fatigue.   The patient was recently admitted August 31 through September 3 with SVT shortly after a total knee replacement on the right side.  Today, she continues to recover from her knee replacement, with ongoing right knee pain. She is working with physical therapy. Her incision site appears to be healing well.   She reports that her most recent episodes of SVT have not been as severe as they were in the past.  She denies any chest pain, shortness of breath, or peripheral edema. No lightheadedness, headaches, syncope, orthopnea, or PND.      Past Medical History:  Diagnosis Date   Arthritis    Coronary artery disease    GERD (gastroesophageal reflux disease)    History of kidney stones    Hypertension    Hypothyroidism     Past Surgical History:  Procedure Laterality Date   ABDOMINAL HYSTERECTOMY     APPENDECTOMY     KNEE SURGERY  11/18/2011   TONSILLECTOMY     TOTAL KNEE ARTHROPLASTY Left 05/04/2017   Procedure: LEFT TOTAL KNEE ARTHROPLASTY;  Surgeon: Paralee Cancel, MD;  Location: [facility];  Service: Orthopedics;  Laterality: Left;  90 mins   TOTAL KNEE ARTHROPLASTY Right 07/11/2022   Procedure: TOTAL KNEE ARTHROPLASTY;   Surgeon: Netta Cedars, MD;  Location: [facility];  Service: Orthopedics;  Laterality: Right;    Current Medications: Current Meds  Medication Sig   acyclovir (ZOVIRAX) 400 MG tablet Take 400 mg by mouth 2 (two) times daily.   aspirin EC 81 MG tablet Take 1 tablet (81 mg total) by mouth 2 (two) times daily. Swallow whole.   atorvastatin (LIPITOR) 40 MG tablet Take 1 tablet (40 mg total) by mouth daily.   buPROPion (WELLBUTRIN XL) 150 MG 24 hr tablet Take 150 mg by mouth daily.   gabapentin (NEURONTIN) 300 MG capsule Take 300 mg by mouth at bedtime.   HYDROcodone-acetaminophen (NORCO/VICODIN) 5-325 MG tablet Take 1 tablet by mouth every 6 (six) hours as needed for moderate pain.   levothyroxine (SYNTHROID, LEVOTHROID) 50 MCG tablet Take 50 mcg by mouth daily before breakfast.   lisinopril (ZESTRIL) 20 MG tablet Take 1 tablet (20 mg total) by mouth daily.   metoprolol tartrate (LOPRESSOR) 25 MG tablet Take 0.5 tablets (12.5 mg total) by mouth daily as needed (HR>100).   nitroGLYCERIN (NITROSTAT) 0.4 MG SL tablet Place 1 tablet (0.4 mg total) under the tongue every 5 (five) minutes x 3 doses as needed for chest pain.   omeprazole (PRILOSEC) 40 MG capsule Take 40 mg by mouth daily.   Potassium 99 MG TABS Take 2 tablets by mouth at bedtime.   promethazine (PHENERGAN) 25 MG tablet Take by oral  route for 5 days.     Allergies:   Albuterol and Oxycodone   Social History   Socioeconomic History   Marital status: Divorced    Spouse name: Not on file   Number of children: Not on file   Years of education: Not on file   Highest education level: Not on file  Occupational History   Occupation: retired  Tobacco Use   Smoking status: Former    Years: 20.00    Types: Cigarettes    Quit date: 04/28/1989    Years since quitting: 33.2   Smokeless tobacco: Never  Vaping Use   Vaping Use: Never used  Substance and Sexual Activity   Alcohol use: Yes    Comment: Occas   Drug use: No   Sexual  activity: Not on file  Other Topics Concern   Not on file  Social History Narrative   Not on file   Social Determinants of Health   Financial Resource Strain: Not on file  Food Insecurity: Not on file  Transportation Needs: Not on file  Physical Activity: Not on file  Stress: Not on file  Social Connections: Not on file     Family History: The patient's family history includes COPD in her brother and mother; Cancer in her father; Heart disease in her father and mother; Hypertension in her son.  ROS:   Please see the history of present illness.    (+) Right knee pain (+) Palpitations All other systems reviewed and are negative.  EKGs/Labs/Other Studies Reviewed:    The following studies were reviewed today:  July 18, 2022 echo EF 65 RV normal Mildly dilated left atrium Trivial MR   May 23, 2022 SPECT No reversible ischemia   April 30, 2022 ZIO monitor personally reviewed  26 SVT episodes, longest 2 hours 40 minutes.  Heart rates up to 210 bpm during SVT.   July 17, 2022 EKG personally reviewed shows sinus rhythm.  No preexcitation.  rSR'in V1.   EKG:   EKG is personally reviewed.  07/28/2022:  EKG was not ordered.    Recent Labs: 07/19/2022: ALT 13; BUN 9; Creatinine, Ser 1.01; Hemoglobin 10.8; Magnesium 1.6; Platelets 281; Potassium 3.5; Sodium 137; TSH 0.903   Recent Lipid Panel    Component Value Date/Time   CHOL 161 09/10/2021 1018   TRIG 77 09/10/2021 1018   HDL 78 09/10/2021 1018   CHOLHDL 2.1 09/10/2021 1018   CHOLHDL 2.7 07/29/2021 0610   VLDL 16 07/29/2021 0610   LDLCALC 68 09/10/2021 1018    Physical Exam:    VS:  BP 120/78   Pulse 80   Ht '5\' 4"'$  (1.626 m)   Wt 159 lb 6.4 oz (72.3 kg)   SpO2 98%   BMI 27.36 kg/m     Wt Readings from Last 3 Encounters:  07/28/22 159 lb 6.4 oz (72.3 kg)  07/19/22 163 lb 6.4 oz (74.1 kg)  07/11/22 160 lb 6.4 oz (72.8 kg)     GEN: Well nourished, well developed in no acute distress HEENT:  Normal NECK: No JVD; No carotid bruits LYMPHATICS: No lymphadenopathy CARDIAC: RRR, no murmurs, rubs, gallops RESPIRATORY:  Clear to auscultation without rales, wheezing or rhonchi  ABDOMEN: Soft, non-tender, non-distended MUSCULOSKELETAL:  No edema; No deformity  SKIN: Warm and dry. Right knee surgical incision healing well. NEUROLOGIC:  Alert and oriented x 3 PSYCHIATRIC:  Normal affect       ASSESSMENT:    1. SVT (supraventricular tachycardia) ([place])  2. Chronic diastolic CHF (congestive heart failure) ([place])   3. Primary hypertension    PLAN:    In order of problems listed above:  #SVT Recurrent. Therapeutic strategies for supraventricular tachycardia including medicine and ablation were discussed in detail with the patient today. Risk, benefits, and alternatives to EP study and radiofrequency ablation were also discussed in detail today. These risks include but are not limited to stroke, bleeding, vascular damage, tamponade, perforation, damage to the heart and other structures, AV block requiring pacemaker, worsening renal function, and death. The patient understands these risk and wishes to proceed.  We will therefore proceed with catheter ablation at the next available time.   Hold metoprolol for 5 days prior to the procedure.   #Chronic diastolic heart failure   #Coronary artery disease   #Hypertension At goal today.  Recommend checking blood pressures 1-2 times per week at home and recording the values.  Recommend bringing these recordings to the primary care physician.   Medication Adjustments/Labs and Tests Ordered: Current medicines are reviewed at length with the patient today.  Concerns regarding medicines are outlined above.  No orders of the defined types were placed in this encounter.  No orders of the defined types were placed in this encounter.   I,Mathew Stumpf,acting as a Education administrator for Vickie Epley, MD.,have documented all relevant documentation on  the behalf of Vickie Epley, MD,as directed by  Vickie Epley, MD while in the presence of Vickie Epley, MD.  I, Vickie Epley, MD, have reviewed all documentation for this visit. The documentation on 07/28/22 for the exam, diagnosis, procedures, and orders are all accurate and complete.   Signed, Hilton Cork. Quentin Ore, MD, [facility], [facility] 07/28/2022 11:48 AM    Electrophysiology [facility]

## 2022-07-28 NOTE — Progress Notes (Signed)
Electrophysiology Office Note:    Date:  07/28/2022   ID:  Stephanie Daniel, DOB 11-Oct-1951, MRN [id number]  PCP:  Pa, [facility] Cardiologist:  Evalina Field, MD  [facility] Electrophysiologist:  None   Referring MD: Geralynn Rile, *   Chief Complaint: SVT  History of Present Illness:    Stephanie Daniel is a 71 y.o. female who presents for an evaluation of SVT at the request of Dr. Audie Box. Their medical history includes coronary artery disease, hypertension, hyperlipidemia and SVT.  The patient last saw Dr. Audie Box May 14, 2022.  She experiences prolonged episodes of rapid heartbeats.  They are occurring every few weeks and last up to 1-1/2 hours.  She has not tolerated metoprolol in the past due to fatigue.   The patient was recently admitted August 31 through September 3 with SVT shortly after a total knee replacement on the right side.  Today, she continues to recover from her knee replacement, with ongoing right knee pain. She is working with physical therapy. Her incision site appears to be healing well.   She reports that her most recent episodes of SVT have not been as severe as they were in the past.  She denies any chest pain, shortness of breath, or peripheral edema. No lightheadedness, headaches, syncope, orthopnea, or PND.      Past Medical History:  Diagnosis Date   Arthritis    Coronary artery disease    GERD (gastroesophageal reflux disease)    History of kidney stones    Hypertension    Hypothyroidism     Past Surgical History:  Procedure Laterality Date   ABDOMINAL HYSTERECTOMY     APPENDECTOMY     KNEE SURGERY  11/18/2011   TONSILLECTOMY     TOTAL KNEE ARTHROPLASTY Left 05/04/2017   Procedure: LEFT TOTAL KNEE ARTHROPLASTY;  Surgeon: Paralee Cancel, MD;  [facility];  Service: Orthopedics;  Laterality: Left;  90 mins   TOTAL KNEE ARTHROPLASTY Right 07/11/2022   Procedure: TOTAL KNEE ARTHROPLASTY;   Surgeon: Netta Cedars, MD;  Location: [facility];  Service: Orthopedics;  Laterality: Right;    Current Medications: Current Meds  Medication Sig   acyclovir (ZOVIRAX) 400 MG tablet Take 400 mg by mouth 2 (two) times daily.   aspirin EC 81 MG tablet Take 1 tablet (81 mg total) by mouth 2 (two) times daily. Swallow whole.   atorvastatin (LIPITOR) 40 MG tablet Take 1 tablet (40 mg total) by mouth daily.   buPROPion (WELLBUTRIN XL) 150 MG 24 hr tablet Take 150 mg by mouth daily.   gabapentin (NEURONTIN) 300 MG capsule Take 300 mg by mouth at bedtime.   HYDROcodone-acetaminophen (NORCO/VICODIN) 5-325 MG tablet Take 1 tablet by mouth every 6 (six) hours as needed for moderate pain.   levothyroxine (SYNTHROID, LEVOTHROID) 50 MCG tablet Take 50 mcg by mouth daily before breakfast.   lisinopril (ZESTRIL) 20 MG tablet Take 1 tablet (20 mg total) by mouth daily.   metoprolol tartrate (LOPRESSOR) 25 MG tablet Take 0.5 tablets (12.5 mg total) by mouth daily as needed (HR>100).   nitroGLYCERIN (NITROSTAT) 0.4 MG SL tablet Place 1 tablet (0.4 mg total) under the tongue every 5 (five) minutes x 3 doses as needed for chest pain.   omeprazole (PRILOSEC) 40 MG capsule Take 40 mg by mouth daily.   Potassium 99 MG TABS Take 2 tablets by mouth at bedtime.   promethazine (PHENERGAN) 25 MG tablet Take by oral  route for 5 days.     Allergies:   Albuterol and Oxycodone   Social History   Socioeconomic History   Marital status: Divorced    Spouse name: Not on file   Number of children: Not on file   Years of education: Not on file   Highest education level: Not on file  Occupational History   Occupation: retired  Tobacco Use   Smoking status: Former    Years: 20.00    Types: Cigarettes    Quit date: 04/28/1989    Years since quitting: 33.2   Smokeless tobacco: Never  Vaping Use   Vaping Use: Never used  Substance and Sexual Activity   Alcohol use: Yes    Comment: Occas   Drug use: No   Sexual  activity: Not on file  Other Topics Concern   Not on file  Social History Narrative   Not on file   Social Determinants of Health   Financial Resource Strain: Not on file  Food Insecurity: Not on file  Transportation Needs: Not on file  Physical Activity: Not on file  Stress: Not on file  Social Connections: Not on file     Family History: The patient's family history includes COPD in her brother and mother; Cancer in her father; Heart disease in her father and mother; Hypertension in her son.  ROS:   Please see the history of present illness.    (+) Right knee pain (+) Palpitations All other systems reviewed and are negative.  EKGs/Labs/Other Studies Reviewed:    The following studies were reviewed today:  July 18, 2022 echo EF 65 RV normal Mildly dilated left atrium Trivial MR   May 23, 2022 SPECT No reversible ischemia   April 30, 2022 ZIO monitor personally reviewed  26 SVT episodes, longest 2 hours 40 minutes.  Heart rates up to 210 bpm during SVT.   July 17, 2022 EKG personally reviewed shows sinus rhythm.  No preexcitation.  rSR'in V1.   EKG:   EKG is personally reviewed.  07/28/2022:  EKG was not ordered.    Recent Labs: 07/19/2022: ALT 13; BUN 9; Creatinine, Ser 1.01; Hemoglobin 10.8; Magnesium 1.6; Platelets 281; Potassium 3.5; Sodium 137; TSH 0.903   Recent Lipid Panel    Component Value Date/Time   CHOL 161 09/10/2021 1018   TRIG 77 09/10/2021 1018   HDL 78 09/10/2021 1018   CHOLHDL 2.1 09/10/2021 1018   CHOLHDL 2.7 07/29/2021 0610   VLDL 16 07/29/2021 0610   LDLCALC 68 09/10/2021 1018    Physical Exam:    VS:  BP 120/78   Pulse 80   Ht '5\' 4"'$  (1.626 m)   Wt 159 lb 6.4 oz (72.3 kg)   SpO2 98%   BMI 27.36 kg/m     Wt Readings from Last 3 Encounters:  07/28/22 159 lb 6.4 oz (72.3 kg)  07/19/22 163 lb 6.4 oz (74.1 kg)  07/11/22 160 lb 6.4 oz (72.8 kg)     GEN: Well nourished, well developed in no acute distress HEENT:  Normal NECK: No JVD; No carotid bruits LYMPHATICS: No lymphadenopathy CARDIAC: RRR, no murmurs, rubs, gallops RESPIRATORY:  Clear to auscultation without rales, wheezing or rhonchi  ABDOMEN: Soft, non-tender, non-distended MUSCULOSKELETAL:  No edema; No deformity  SKIN: Warm and dry. Right knee surgical incision healing well. NEUROLOGIC:  Alert and oriented x 3 PSYCHIATRIC:  Normal affect       ASSESSMENT:    1. SVT (supraventricular tachycardia) ([place])  2. Chronic diastolic CHF (congestive heart failure) ([place])   3. Primary hypertension    PLAN:    In order of problems listed above:  #SVT Recurrent. Therapeutic strategies for supraventricular tachycardia including medicine and ablation were discussed in detail with the patient today. Risk, benefits, and alternatives to EP study and radiofrequency ablation were also discussed in detail today. These risks include but are not limited to stroke, bleeding, vascular damage, tamponade, perforation, damage to the heart and other structures, AV block requiring pacemaker, worsening renal function, and death. The patient understands these risk and wishes to proceed.  We will therefore proceed with catheter ablation at the next available time.   Hold metoprolol for 5 days prior to the procedure.   #Chronic diastolic heart failure   #Coronary artery disease   #Hypertension At goal today.  Recommend checking blood pressures 1-2 times per week at home and recording the values.  Recommend bringing these recordings to the primary care physician.   Medication Adjustments/Labs and Tests Ordered: Current medicines are reviewed at length with the patient today.  Concerns regarding medicines are outlined above.  No orders of the defined types were placed in this encounter.  No orders of the defined types were placed in this encounter.   I,Mathew Stumpf,acting as a Education administrator for Vickie Epley, MD.,have documented all relevant documentation on  the behalf of Vickie Epley, MD,as directed by  Vickie Epley, MD while in the presence of Vickie Epley, MD.  I, Vickie Epley, MD, have reviewed all documentation for this visit. The documentation on 07/28/22 for the exam, diagnosis, procedures, and orders are all accurate and complete.   Signed, Hilton Cork. Quentin Ore, MD, [facility], [facility] 07/28/2022 11:48 AM    Electrophysiology [facility]

## 2022-07-29 DIAGNOSIS — M25661 Stiffness of right knee, not elsewhere classified: Secondary | ICD-10-CM | POA: Diagnosis not present

## 2022-07-29 DIAGNOSIS — M25561 Pain in right knee: Secondary | ICD-10-CM | POA: Diagnosis not present

## 2022-07-29 LAB — CBC WITH DIFFERENTIAL/PLATELET
Basophils Absolute: 0.1 10*3/uL (ref 0.0–0.2)
Basos: 1 %
EOS (ABSOLUTE): 0.5 10*3/uL — ABNORMAL HIGH (ref 0.0–0.4)
Eos: 7 %
Hematocrit: 37.2 % (ref 34.0–46.6)
Hemoglobin: 12.6 g/dL (ref 11.1–15.9)
Immature Grans (Abs): 0 10*3/uL (ref 0.0–0.1)
Immature Granulocytes: 0 %
Lymphocytes Absolute: 1.9 10*3/uL (ref 0.7–3.1)
Lymphs: 25 %
MCH: 32 pg (ref 26.6–33.0)
MCHC: 33.9 g/dL (ref 31.5–35.7)
MCV: 94 fL (ref 79–97)
Monocytes Absolute: 0.9 10*3/uL (ref 0.1–0.9)
Monocytes: 12 %
Neutrophils Absolute: 4.2 10*3/uL (ref 1.4–7.0)
Neutrophils: 55 %
Platelets: 385 10*3/uL (ref 150–450)
RBC: 3.94 x10E6/uL (ref 3.77–5.28)
RDW: 13.3 % (ref 11.7–15.4)
WBC: 7.6 10*3/uL (ref 3.4–10.8)

## 2022-07-29 LAB — BASIC METABOLIC PANEL
BUN/Creatinine Ratio: 12 (ref 12–28)
BUN: 12 mg/dL (ref 8–27)
CO2: 22 mmol/L (ref 20–29)
Calcium: 9.2 mg/dL (ref 8.7–10.3)
Chloride: 103 mmol/L (ref 96–106)
Creatinine, Ser: 1.01 mg/dL — ABNORMAL HIGH (ref 0.57–1.00)
Glucose: 86 mg/dL (ref 70–99)
Potassium: 4.1 mmol/L (ref 3.5–5.2)
Sodium: 140 mmol/L (ref 134–144)
eGFR: 59 mL/min/{1.73_m2} — ABNORMAL LOW (ref >59)

## 2022-07-30 NOTE — Discharge Summary (Signed)
In most cases prophylactic antibiotics for Dental procdeures after total joint surgery are not necessary.  Exceptions are as follows:  1. History of prior total joint infection  2. Severely immunocompromised (Organ Transplant, cancer chemotherapy, Rheumatoid biologic meds such as [employer])  3. Poorly controlled diabetes (A1C &gt; 8.0, blood glucose over 200)  If you have one of these conditions, contact your surgeon for an antibiotic prescription, prior to your dental procedure. Orthopedic Discharge Summary        Physician Discharge Summary  Patient ID: Stephanie Daniel MRN: [id number] DOB/AGE: [birth year] 71 y.o.  Admit date: 07/11/2022 Discharge date: 07/12/22  Procedures:  Procedure(s) (LRB): TOTAL KNEE ARTHROPLASTY (Right)  Attending Physician:  Dr. Esmond Plants  Admission Diagnoses:   right knee end stage osteoarthritis  Discharge Diagnoses:  right knee end stage osteoarthritis   Past Medical History:  Diagnosis Date   Arthritis    Coronary artery disease    GERD (gastroesophageal reflux disease)    History of kidney stones    Hypertension    Hypothyroidism     PCP: Pa, [place]   Discharged Condition: good  Hospital Course:  Patient underwent the above stated procedure on 07/11/2022. Patient tolerated the procedure well and brought to the recovery room in good condition and subsequently to the floor. Patient had an uncomplicated hospital course and was stable for discharge.   Disposition: Discharge disposition: 01-Home or Self Care      with follow up in 2 weeks    Follow-up Information     Netta Cedars, MD. Go on 07/24/2022.   Specialty: Orthopedic Surgery Why: You are scheduled for first post op appointment on Thursday September 7th at 9:30am. Contact information: [address] [place] [address] [phone]         Rosilyn Mings.. Go on 07/14/2022.   Why: You are scheduled for physical  therapy eval on Monday August 28th at 10:30am. Contact information: [address] [place] [address] [phone]                 Dental Antibiotics:  In most cases prophylactic antibiotics for Dental procdeures after total joint surgery are not necessary.  Exceptions are as follows:  1. History of prior total joint infection  2. Severely immunocompromised (Organ Transplant, cancer chemotherapy, Rheumatoid biologic meds such as [place])  3. Poorly controlled diabetes (A1C &gt; 8.0, blood glucose over 200)  If you have one of these conditions, contact your surgeon for an antibiotic prescription, prior to your dental procedure.  Discharge Instructions     Call MD / Call 911   Complete by: As directed    If you experience chest pain or shortness of breath, CALL 911 and be transported to the hospital emergency room.  If you develope a fever above 101 F, pus (white drainage) or increased drainage or redness at the wound, or calf pain, call your surgeon's office.   Constipation Prevention   Complete by: As directed    Drink plenty of fluids.  Prune juice may be helpful.  You may use a stool softener, such as Colace (over the counter) 100 mg twice a day.  Use MiraLax (over the counter) for constipation as needed.   Diet - low sodium heart healthy   Complete by: As directed    Do not put a pillow under the knee. Place it under the heel.   Complete by: As directed    Driving restrictions  Complete by: As directed    No driving for two weeks   Post-operative opioid taper instructions:   Complete by: As directed    POST-OPERATIVE OPIOID TAPER INSTRUCTIONS: It is important to wean off of your opioid medication as soon as possible. If you do not need pain medication after your surgery it is ok to stop day one. Opioids include: Codeine, Hydrocodone(Norco, Vicodin), Oxycodone(Percocet, oxycontin) and hydromorphone amongst others.  Long term and even short  term use of opiods can cause: Increased pain response Dependence Constipation Depression Respiratory depression And more.  Withdrawal symptoms can include Flu like symptoms Nausea, vomiting And more Techniques to manage these symptoms Hydrate well Eat regular healthy meals Stay active Use relaxation techniques(deep breathing, meditating, yoga) Do Not substitute Alcohol to help with tapering If you have been on opioids for less than two weeks and do not have pain than it is ok to stop all together.  Plan to wean off of opioids This plan should start within one week post op of your joint replacement. Maintain the same interval or time between taking each dose and first decrease the dose.  Cut the total daily intake of opioids by one tablet each day Next start to increase the time between doses. The last dose that should be eliminated is the evening dose.      TED hose   Complete by: As directed    Use stockings (TED hose) for three weeks on both leg(s).  You may remove them at night for sleeping.   Weight bearing as tolerated   Complete by: As directed        Allergies as of 07/12/2022       Reactions   Albuterol Other (See Comments)   Respiratory distress        Medication List     STOP taking these medications    HYDROcodone-acetaminophen 5-325 MG tablet Commonly known as: NORCO/VICODIN       TAKE these medications    acyclovir 400 MG tablet Commonly known as: ZOVIRAX Take 400 mg by mouth 2 (two) times daily.   aspirin EC 81 MG tablet Take 1 tablet (81 mg total) by mouth 2 (two) times daily. Swallow whole. What changed: when to take this   atorvastatin 40 MG tablet Commonly known as: LIPITOR Take 1 tablet (40 mg total) by mouth daily.   buPROPion 150 MG 24 hr tablet Commonly known as: WELLBUTRIN XL Take 150 mg by mouth daily.   gabapentin 300 MG capsule Commonly known as: NEURONTIN Take 300 mg by mouth at bedtime.   levothyroxine 50 MCG  tablet Commonly known as: SYNTHROID Take 50 mcg by mouth daily before breakfast.   lisinopril 20 MG tablet Commonly known as: ZESTRIL Take 1 tablet (20 mg total) by mouth daily.   nitroGLYCERIN 0.4 MG SL tablet Commonly known as: NITROSTAT Place 1 tablet (0.4 mg total) under the tongue every 5 (five) minutes x 3 doses as needed for chest pain.   omeprazole 40 MG capsule Commonly known as: PRILOSEC Take 40 mg by mouth daily.   Potassium 99 MG Tabs Take 2 tablets by mouth at bedtime.               Discharge Care Instructions  (From admission, onward)           Start     Ordered   07/12/22 0000  Weight bearing as tolerated        07/12/22 1412  Signed: Ventura Bruns 07/30/2022, 1:07 PM  [facility] is now [facility] [address], [place], [place], [place] [address] Phone: [place]

## 2022-08-01 DIAGNOSIS — M25661 Stiffness of right knee, not elsewhere classified: Secondary | ICD-10-CM | POA: Diagnosis not present

## 2022-08-01 DIAGNOSIS — M25561 Pain in right knee: Secondary | ICD-10-CM | POA: Diagnosis not present

## 2022-08-05 DIAGNOSIS — M25661 Stiffness of right knee, not elsewhere classified: Secondary | ICD-10-CM | POA: Diagnosis not present

## 2022-08-05 DIAGNOSIS — M25561 Pain in right knee: Secondary | ICD-10-CM | POA: Diagnosis not present

## 2022-08-08 DIAGNOSIS — M25661 Stiffness of right knee, not elsewhere classified: Secondary | ICD-10-CM | POA: Diagnosis not present

## 2022-08-08 DIAGNOSIS — M25561 Pain in right knee: Secondary | ICD-10-CM | POA: Diagnosis not present

## 2022-08-12 DIAGNOSIS — M25561 Pain in right knee: Secondary | ICD-10-CM | POA: Diagnosis not present

## 2022-08-12 DIAGNOSIS — M25661 Stiffness of right knee, not elsewhere classified: Secondary | ICD-10-CM | POA: Diagnosis not present

## 2022-08-14 DIAGNOSIS — M25561 Pain in right knee: Secondary | ICD-10-CM | POA: Diagnosis not present

## 2022-08-14 DIAGNOSIS — M25661 Stiffness of right knee, not elsewhere classified: Secondary | ICD-10-CM | POA: Diagnosis not present

## 2022-08-18 DIAGNOSIS — M25661 Stiffness of right knee, not elsewhere classified: Secondary | ICD-10-CM | POA: Diagnosis not present

## 2022-08-18 DIAGNOSIS — M25561 Pain in right knee: Secondary | ICD-10-CM | POA: Diagnosis not present

## 2022-08-20 ENCOUNTER — Telehealth: Payer: Self-pay | Admitting: *Deleted

## 2022-08-20 NOTE — Pre-Procedure Instructions (Signed)
Instructed patient on the following items: Arrival time 0900 Nothing to eat or drink after midnight No meds AM of procedure Responsible person to drive you home and stay with you for 24 hrs

## 2022-08-20 NOTE — Telephone Encounter (Signed)
Patient given new arrival time for procedure on Oct 5 at 9 am.  Verbalized understanding and agreement.

## 2022-08-21 ENCOUNTER — Ambulatory Visit (HOSPITAL_BASED_OUTPATIENT_CLINIC_OR_DEPARTMENT_OTHER): Payer: Medicare Other | Admitting: Anesthesiology

## 2022-08-21 ENCOUNTER — Ambulatory Visit (HOSPITAL_COMMUNITY): Payer: Medicare Other | Admitting: Anesthesiology

## 2022-08-21 ENCOUNTER — Encounter (HOSPITAL_COMMUNITY)
Admission: RE | Disposition: A | Discharge status: Home / Self Care | Payer: Self-pay | Source: Home / Self Care | Attending: Cardiology

## 2022-08-21 ENCOUNTER — Other Ambulatory Visit: Payer: Self-pay

## 2022-08-21 ENCOUNTER — Ambulatory Visit (HOSPITAL_COMMUNITY)
Admission: RE | Admit: 2022-08-21 | Discharge: 2022-08-21 | Disposition: A | Discharge status: Home / Self Care | Payer: Medicare Other | Attending: Cardiology | Admitting: Cardiology

## 2022-08-21 DIAGNOSIS — E785 Hyperlipidemia, unspecified: Secondary | ICD-10-CM | POA: Diagnosis not present

## 2022-08-21 DIAGNOSIS — I5032 Chronic diastolic (congestive) heart failure: Secondary | ICD-10-CM | POA: Insufficient documentation

## 2022-08-21 DIAGNOSIS — K219 Gastro-esophageal reflux disease without esophagitis: Secondary | ICD-10-CM | POA: Diagnosis not present

## 2022-08-21 DIAGNOSIS — I129 Hypertensive chronic kidney disease with stage 1 through stage 4 chronic kidney disease, or unspecified chronic kidney disease: Secondary | ICD-10-CM

## 2022-08-21 DIAGNOSIS — I471 Supraventricular tachycardia, unspecified: Secondary | ICD-10-CM | POA: Diagnosis not present

## 2022-08-21 DIAGNOSIS — I110 Hypertensive heart disease with heart failure: Secondary | ICD-10-CM | POA: Insufficient documentation

## 2022-08-21 DIAGNOSIS — Z96651 Presence of right artificial knee joint: Secondary | ICD-10-CM | POA: Diagnosis not present

## 2022-08-21 DIAGNOSIS — I2510 Atherosclerotic heart disease of native coronary artery without angina pectoris: Secondary | ICD-10-CM | POA: Diagnosis not present

## 2022-08-21 DIAGNOSIS — Z87891 Personal history of nicotine dependence: Secondary | ICD-10-CM | POA: Insufficient documentation

## 2022-08-21 DIAGNOSIS — E039 Hypothyroidism, unspecified: Secondary | ICD-10-CM | POA: Diagnosis not present

## 2022-08-21 DIAGNOSIS — N189 Chronic kidney disease, unspecified: Secondary | ICD-10-CM

## 2022-08-21 DIAGNOSIS — I4719 Other supraventricular tachycardia: Secondary | ICD-10-CM | POA: Insufficient documentation

## 2022-08-21 HISTORY — PX: SVT ABLATION: EP1225

## 2022-08-21 SURGERY — SVT ABLATION
Anesthesia: Monitor Anesthesia Care

## 2022-08-21 MED ORDER — HEPARIN (PORCINE) IN NACL 1000-0.9 UT/500ML-% IV SOLN
INTRAVENOUS | Status: DC | PRN
  Administered 2022-08-21 (×2): 500 mL

## 2022-08-21 MED ORDER — SODIUM CHLORIDE 0.9 % IV SOLN: 250.0000 mL | INTRAVENOUS | Status: DC | PRN

## 2022-08-21 MED ORDER — HEPARIN SODIUM (PORCINE) 1000 UNIT/ML IJ SOLN
INTRAMUSCULAR | Status: AC
  Filled 2022-08-21: qty 10

## 2022-08-21 MED ORDER — ACETAMINOPHEN 500 MG PO TABS
1000.0000 mg | ORAL_TABLET | Freq: Once | ORAL | Status: AC
  Administered 2022-08-21: 1000 mg via ORAL

## 2022-08-21 MED ORDER — DEXMEDETOMIDINE HCL IN NACL 80 MCG/20ML IV SOLN
INTRAVENOUS | Status: DC | PRN
  Administered 2022-08-21 (×2): 4 ug via BUCCAL

## 2022-08-21 MED ORDER — ACETAMINOPHEN 325 MG PO TABS: 650.0000 mg | ORAL_TABLET | ORAL | Status: DC | PRN

## 2022-08-21 MED ORDER — ISOPROTERENOL HCL 0.2 MG/ML IJ SOLN
INTRAVENOUS | Status: DC | PRN
  Administered 2022-08-21: 2 ug/min via INTRAVENOUS

## 2022-08-21 MED ORDER — ISOPROTERENOL HCL 0.2 MG/ML IJ SOLN
INTRAMUSCULAR | Status: AC
  Filled 2022-08-21: qty 5

## 2022-08-21 MED ORDER — BUPIVACAINE HCL (PF) 0.25 % IJ SOLN
INTRAMUSCULAR | Status: DC | PRN
  Administered 2022-08-21: 30 mL

## 2022-08-21 MED ORDER — PROPOFOL 500 MG/50ML IV EMUL
INTRAVENOUS | Status: DC | PRN
  Administered 2022-08-21: 100 ug/kg/min via INTRAVENOUS

## 2022-08-21 MED ORDER — SODIUM CHLORIDE 0.9% FLUSH: 3.0000 mL | Freq: Two times a day (BID) | INTRAVENOUS | Status: DC

## 2022-08-21 MED ORDER — MIDAZOLAM HCL 2 MG/2ML IJ SOLN
INTRAMUSCULAR | Status: DC | PRN
  Administered 2022-08-21 (×2): 1 mg via INTRAVENOUS

## 2022-08-21 MED ORDER — LISINOPRIL 20 MG PO TABS
20.0000 mg | ORAL_TABLET | Freq: Once | ORAL | Status: AC
  Administered 2022-08-21: 20 mg via ORAL
  Filled 2022-08-21: qty 1

## 2022-08-21 MED ORDER — SODIUM CHLORIDE 0.9 % IV SOLN: INTRAVENOUS | Status: DC

## 2022-08-21 MED ORDER — ACETAMINOPHEN 500 MG PO TABS
ORAL_TABLET | ORAL | Status: AC
  Filled 2022-08-21: qty 2

## 2022-08-21 MED ORDER — ONDANSETRON HCL 4 MG/2ML IJ SOLN: 4.0000 mg | Freq: Four times a day (QID) | INTRAMUSCULAR | Status: DC | PRN

## 2022-08-21 MED ORDER — FENTANYL CITRATE (PF) 100 MCG/2ML IJ SOLN
INTRAMUSCULAR | Status: DC | PRN
  Administered 2022-08-21: 50 ug via INTRAVENOUS

## 2022-08-21 MED ORDER — BUPIVACAINE HCL (PF) 0.25 % IJ SOLN
INTRAMUSCULAR | Status: AC
  Filled 2022-08-21: qty 30

## 2022-08-21 MED ORDER — SODIUM CHLORIDE 0.9% FLUSH: 3.0000 mL | INTRAVENOUS | Status: DC | PRN

## 2022-08-21 MED ORDER — HEPARIN SODIUM (PORCINE) 1000 UNIT/ML IJ SOLN
INTRAMUSCULAR | Status: DC | PRN
  Administered 2022-08-21: 1000 [IU] via INTRAVENOUS

## 2022-08-21 SURGICAL SUPPLY — 14 items
CATH CRD2 QUAD 6FR REP (CATHETERS) IMPLANT
CATH DECANAV F CURVE (CATHETERS) IMPLANT
CATH JOSEPH QUAD ALLRED 6F REP (CATHETERS) IMPLANT
CATH SMTCH THERMOCOOL SF DF (CATHETERS) IMPLANT
CLOSURE PERCLOSE PROSTYLE (VASCULAR PRODUCTS) IMPLANT
PACK EP LATEX FREE (CUSTOM PROCEDURE TRAY) ×1
PACK EP LF (CUSTOM PROCEDURE TRAY) ×2 IMPLANT
PAD DEFIB RADIO PHYSIO CONN (PAD) ×2 IMPLANT
PATCH CARTO3 (PAD) IMPLANT
SHEATH CARTO VIZIGO SM CVD (SHEATH) IMPLANT
SHEATH PINNACLE 7F 10CM (SHEATH) IMPLANT
SHEATH PINNACLE 8F 10CM (SHEATH) IMPLANT
SHEATH PROBE COVER 6X72 (BAG) IMPLANT
TUBING SMART ABLATE COOLFLOW (TUBING) IMPLANT

## 2022-08-21 NOTE — Transfer of Care (Signed)
Immediate Anesthesia Transfer of Care Note  Patient: Stephanie Daniel  Procedure(s) Performed: SVT ABLATION  Patient Location: PACU  Anesthesia Type:MAC  Level of Consciousness: awake, alert  and oriented  Airway & Oxygen Therapy: Patient Spontanous Breathing  Post-op Assessment: Report given to RN, Post -op Vital signs reviewed and stable and Patient moving all extremities X 4  Post vital signs: Reviewed and stable  Last Vitals:  Vitals Value Taken Time  BP 143/67 08/21/22 1211  Temp    Pulse    Resp    SpO2    Vitals shown include unvalidated device data.  Last Pain:  Vitals:   08/21/22 0927  TempSrc:   PainSc: 0-No pain         Complications: There were no known notable events for this encounter.

## 2022-08-21 NOTE — Interval H&P Note (Signed)
History and Physical Interval Note:  08/21/2022 10:08 AM  Stephanie Daniel  has presented today for surgery, with the diagnosis of SVT.  The various methods of treatment have been discussed with the patient and family. After consideration of risks, benefits and other options for treatment, the patient has consented to  Procedure(s): SVT ABLATION (N/A) as a surgical intervention.  The patient's history has been reviewed, patient examined, no change in status, stable for surgery.  I have reviewed the patient's chart and labs.  Questions were answered to the patient's satisfaction.     Baltazar Pekala T Kiylah Loyer

## 2022-08-21 NOTE — Anesthesia Postprocedure Evaluation (Signed)
Anesthesia Post Note  Patient: Stephanie Daniel  Procedure(s) Performed: SVT ABLATION     Patient location during evaluation: PACU Anesthesia Type: MAC Level of consciousness: awake and alert Pain management: pain level controlled Vital Signs Assessment: post-procedure vital signs reviewed and stable Respiratory status: spontaneous breathing, nonlabored ventilation and respiratory function stable Cardiovascular status: stable and blood pressure returned to baseline Anesthetic complications: no   There were no known notable events for this encounter.  Last Vitals:  Vitals:   08/21/22 1245 08/21/22 1300  BP: (!) 182/83 (!) 157/83  Pulse: 80 82  Resp: 18 16  Temp:    SpO2: 91% 94%    Last Pain:  Vitals:   08/21/22 1315  TempSrc:   PainSc: 0-No pain                 Audry Pili

## 2022-08-21 NOTE — Anesthesia Preprocedure Evaluation (Addendum)
Anesthesia Evaluation  Patient identified by MRN, date of birth, ID band Patient awake    Reviewed: Allergy & Precautions, NPO status , Patient's Chart, lab work & pertinent test results, reviewed documented beta blocker date and time   History of Anesthesia Complications Negative for: history of anesthetic complications  Airway Mallampati: II  TM Distance: >3 FB Neck ROM: Full    Dental  (+) Dental Advisory Given, Teeth Intact   Pulmonary former smoker,    Pulmonary exam normal        Cardiovascular hypertension, Pt. on medications and Pt. on home beta blockers + CAD  Normal cardiovascular exam+ dysrhythmias Supra Ventricular Tachycardia    '23 TTE - EF 65 to 70%. Grade I diastolic dysfunction (impaired relaxation). Left atrial size was mildly dilated. Trivial mitral valve regurgitation. Aortic valve regurgitation is trivial. There was prominent anterior fat pad.     Neuro/Psych negative neurological ROS  negative psych ROS   GI/Hepatic Neg liver ROS, GERD  Medicated and Controlled,  Endo/Other  Hypothyroidism   Renal/GU CRFRenal disease     Musculoskeletal  (+) Arthritis , Osteoarthritis,    Abdominal   Peds  Hematology negative hematology ROS (+)   Anesthesia Other Findings   Reproductive/Obstetrics                            Anesthesia Physical Anesthesia Plan  ASA: 3  Anesthesia Plan: MAC   Post-op Pain Management: Minimal or no pain anticipated and Tylenol PO (pre-op)*   Induction:   PONV Risk Score and Plan: 2 and Propofol infusion and Treatment may vary due to age or medical condition  Airway Management Planned: Natural Airway and Simple Face Mask  Additional Equipment: None  Intra-op Plan:   Post-operative Plan:   Informed Consent: I have reviewed the patients History and Physical, chart, labs and discussed the procedure including the risks, benefits and  alternatives for the proposed anesthesia with the patient or authorized representative who has indicated his/her understanding and acceptance.       Plan Discussed with: CRNA, Anesthesiologist and Surgeon  Anesthesia Plan Comments:        Anesthesia Quick Evaluation

## 2022-08-21 NOTE — Discharge Instructions (Signed)

## 2022-08-21 NOTE — Progress Notes (Signed)
Patient's blood pressure was ranging in 160s/90s and last pressure was 183/94. Dr. Quentin Ore gave me verbal orders to give patient's home medicine Lisinopril '20mg'$  then discharge home after 30 minutes. Orders followed. Patient was discharged.

## 2022-08-22 ENCOUNTER — Encounter (HOSPITAL_COMMUNITY): Payer: Self-pay | Admitting: Cardiology

## 2022-09-01 ENCOUNTER — Telehealth: Payer: Self-pay | Admitting: Cardiology

## 2022-09-01 DIAGNOSIS — M47896 Other spondylosis, lumbar region: Secondary | ICD-10-CM | POA: Diagnosis not present

## 2022-09-01 DIAGNOSIS — M5136 Other intervertebral disc degeneration, lumbar region: Secondary | ICD-10-CM | POA: Diagnosis not present

## 2022-09-01 NOTE — Telephone Encounter (Signed)
Patient had an episode of SVT 10/15 that felt like the SVT she had in the past at a much milder episode lasting only ~15 minutes. She did not use the PRN Metoprolol Tartrate as she did not have it with her but also noted the episode did not feel like her HR was over 100. Advised if this occurred again to let us know a track her HR during the episode. Also use PRN medication. Will make Dr. Quentin Ore aware and let her know of any updates from him.  Verbalized understanding.

## 2022-09-01 NOTE — Telephone Encounter (Signed)
Patient called and said that Dr. Quentin Ore did an ablation on 10/5 and she said that she had an mild episode SVT and wanted to know if that was normal

## 2022-09-02 DIAGNOSIS — Z23 Encounter for immunization: Secondary | ICD-10-CM | POA: Diagnosis not present

## 2022-09-09 DIAGNOSIS — M25511 Pain in right shoulder: Secondary | ICD-10-CM | POA: Diagnosis not present

## 2022-09-09 DIAGNOSIS — M19012 Primary osteoarthritis, left shoulder: Secondary | ICD-10-CM | POA: Diagnosis not present

## 2022-09-09 DIAGNOSIS — M7551 Bursitis of right shoulder: Secondary | ICD-10-CM | POA: Diagnosis not present

## 2022-09-09 DIAGNOSIS — M25512 Pain in left shoulder: Secondary | ICD-10-CM | POA: Diagnosis not present

## 2022-09-18 ENCOUNTER — Ambulatory Visit: Payer: Medicare Other | Attending: Cardiology | Admitting: Cardiology

## 2022-09-18 ENCOUNTER — Encounter: Payer: Self-pay | Admitting: Cardiology

## 2022-09-18 VITALS — BP 162/92 | HR 86 | Ht 64.0 in | Wt 158.4 lb

## 2022-09-18 DIAGNOSIS — I5032 Chronic diastolic (congestive) heart failure: Secondary | ICD-10-CM | POA: Insufficient documentation

## 2022-09-18 DIAGNOSIS — I471 Supraventricular tachycardia, unspecified: Secondary | ICD-10-CM | POA: Diagnosis not present

## 2022-09-18 DIAGNOSIS — I10 Essential (primary) hypertension: Secondary | ICD-10-CM | POA: Diagnosis not present

## 2022-09-18 NOTE — Progress Notes (Signed)
Electrophysiology Office Follow up Visit Note:    Date:  09/18/2022   ID:  Stephanie Daniel, DOB 1951/01/09, MRN [id number]  PCP:  Pa, [place] Cardiologist:  Evalina Field, MD  [facility] Electrophysiologist:  Vickie Epley, MD    Interval History:    Stephanie Daniel is a 72 y.o. female who presents for a follow up visit.  She had an EP study and ablation 08/21/2022 during which the slow pathway was ablated.   Today, she reports having a few very mild "feelings" of palpitations since her ablation. Otherwise she is feeling well.  In clinic today her blood pressure is elevated at 162/92, which she attributes to white coat hypertension. At the dentist recently her BP was 431 systolic. She monitors her blood pressures at home, and typically sees readings closer to 132/77.  Since her knee surgery, she has noticed the onset of essential tremors in her bilateral hands. She wonders if this was caused by the anesthesia.  She denies any chest pain, shortness of breath, or peripheral edema. No lightheadedness, headaches, syncope, orthopnea, or PND.      Past Medical History:  Diagnosis Date   Arthritis    Coronary artery disease    GERD (gastroesophageal reflux disease)    History of kidney stones    Hypertension    Hypothyroidism     Past Surgical History:  Procedure Laterality Date   ABDOMINAL HYSTERECTOMY     APPENDECTOMY     KNEE SURGERY  11/18/2011   SVT ABLATION N/A 08/21/2022   Procedure: SVT ABLATION;  Surgeon: Vickie Epley, MD;  Location: [facility];  Service: Cardiovascular;  Laterality: N/A;   TONSILLECTOMY     TOTAL KNEE ARTHROPLASTY Left 05/04/2017   Procedure: LEFT TOTAL KNEE ARTHROPLASTY;  Surgeon: Paralee Cancel, MD;  Location: [facility];  Service: Orthopedics;  Laterality: Left;  90 mins   TOTAL KNEE ARTHROPLASTY Right 07/11/2022   Procedure: TOTAL KNEE ARTHROPLASTY;  Surgeon: Netta Cedars, MD;  Location: [facility];  Service: Orthopedics;  Laterality: Right;    Current Medications: Current Meds  Medication Sig   acyclovir (ZOVIRAX) 400 MG tablet Take 400 mg by mouth 2 (two) times daily.   aspirin EC 81 MG tablet Take 81 mg by mouth 2 (two) times daily. Swallow whole.   atorvastatin (LIPITOR) 40 MG tablet Take 1 tablet (40 mg total) by mouth daily.   buPROPion (WELLBUTRIN XL) 150 MG 24 hr tablet Take 150 mg by mouth daily.   gabapentin (NEURONTIN) 300 MG capsule Take 300 mg by mouth at bedtime.   HYDROcodone-acetaminophen (NORCO/VICODIN) 5-325 MG tablet Take 1 tablet by mouth every 6 (six) hours as needed for moderate pain.   levothyroxine (SYNTHROID, LEVOTHROID) 50 MCG tablet Take 50 mcg by mouth daily before breakfast.   lisinopril (ZESTRIL) 20 MG tablet Take 1 tablet (20 mg total) by mouth daily.   metoprolol tartrate (LOPRESSOR) 25 MG tablet Take 0.5 tablets (12.5 mg total) by mouth daily as needed (HR>100).   nitroGLYCERIN (NITROSTAT) 0.4 MG SL tablet Place 1 tablet (0.4 mg total) under the tongue every 5 (five) minutes x 3 doses as needed for chest pain.   omeprazole (PRILOSEC) 40 MG capsule Take 40 mg by mouth daily.   Potassium 99 MG TABS Take 198 mg by mouth at bedtime.     Allergies:   Albuterol and Oxycodone   Social History   Socioeconomic History   Marital status:  Divorced    Spouse name: Not on file   Number of children: Not on file   Years of education: Not on file   Highest education level: Not on file  Occupational History   Occupation: retired  Tobacco Use   Smoking status: Former    Years: 20.00    Types: Cigarettes    Quit date: 04/28/1989    Years since quitting: 33.4   Smokeless tobacco: Never  Vaping Use   Vaping Use: Never used  Substance and Sexual Activity   Alcohol use: Yes    Comment: Occas   Drug use: No   Sexual activity: Not on file  Other Topics Concern   Not on file  Social History Narrative   Not on file   Social Determinants of Health    Financial Resource Strain: Not on file  Food Insecurity: Not on file  Transportation Needs: Not on file  Physical Activity: Not on file  Stress: Not on file  Social Connections: Not on file     Family History: The patient's family history includes COPD in her brother and mother; Cancer in her father; Heart disease in her father and mother; Hypertension in her son.  ROS:   Please see the history of present illness.    (+) Mild palpitations (+) Bilateral hand tremors All other systems reviewed and are negative.  EKGs/Labs/Other Studies Reviewed:    The following studies were reviewed today:  07/18/2022 Echo EF 65 RV normal Trivial MR   Recent Labs: 07/19/2022: ALT 13; Magnesium 1.6; TSH 0.903 07/28/2022: BUN 12; Creatinine, Ser 1.01; Hemoglobin 12.6; Platelets 385; Potassium 4.1; Sodium 140   Recent Lipid Panel    Component Value Date/Time   CHOL 161 09/10/2021 1018   TRIG 77 09/10/2021 1018   HDL 78 09/10/2021 1018   CHOLHDL 2.1 09/10/2021 1018   CHOLHDL 2.7 07/29/2021 0610   VLDL 16 07/29/2021 0610   LDLCALC 68 09/10/2021 1018    Physical Exam:    VS:  BP (!) 162/92   Pulse 86   Ht '5\' 4"'$  (1.626 m)   Wt 158 lb 6.4 oz (71.8 kg)   SpO2 97%   BMI 27.19 kg/m     Wt Readings from Last 3 Encounters:  09/18/22 158 lb 6.4 oz (71.8 kg)  08/21/22 160 lb (72.6 kg)  07/28/22 159 lb 6.4 oz (72.3 kg)     GEN: Well nourished, well developed in no acute distress HEENT: Normal NECK: No JVD; No carotid bruits LYMPHATICS: No lymphadenopathy CARDIAC: RRR, no murmurs, rubs, gallops RESPIRATORY:  Clear to auscultation without rales, wheezing or rhonchi  ABDOMEN: Soft, non-tender, non-distended MUSCULOSKELETAL:  No edema; No deformity  SKIN: Warm and dry NEUROLOGIC:  Alert and oriented x 3 PSYCHIATRIC:  Normal affect        ASSESSMENT:    1. SVT (supraventricular tachycardia)   2. Chronic diastolic CHF (congestive heart failure) ([place])   3. Primary hypertension     PLAN:    In order of problems listed above:  #SVT Doing well after EP study and ablation.  No recurrence.   #Chronic diastolic HF Continue metop, lisinopril  #Hypertension Above goal today. Checks frequently at home and they are in the 101B systolic. Recommend checking blood pressures 1-2 times per week at home and recording the values.  Recommend bringing these recordings to the primary care physician.    Follow up as needed with EP.    Medication Adjustments/Labs and Tests Ordered: Current medicines are reviewed  at length with the patient today.  Concerns regarding medicines are outlined above.   No orders of the defined types were placed in this encounter.  No orders of the defined types were placed in this encounter.  I,Mathew Stumpf,acting as a Education administrator for Vickie Epley, MD.,have documented all relevant documentation on the behalf of Vickie Epley, MD,as directed by  Vickie Epley, MD while in the presence of Vickie Epley, MD.  I, Vickie Epley, MD, have reviewed all documentation for this visit. The documentation on 09/18/22 for the exam, diagnosis, procedures, and orders are all accurate and complete.   Signed, Lars Mage, MD, [address] [facility], [facility] 09/18/2022 12:01 PM    Electrophysiology [facility]

## 2022-09-18 NOTE — Progress Notes (Deleted)
Electrophysiology Office Follow up Visit Note:    Date:  09/18/2022   ID:  Stephanie Daniel, DOB [birth year], MRN [id number]  PCP:  Pa, [place] Cardiologist:  Evalina Field, MD  [facility] Electrophysiologist:  Vickie Epley, MD    Interval History:    Stephanie Daniel is a 71 y.o. female who presents for a follow up visit.  She had an EP study and ablation 08/21/2022 during which the slow pathway was ablated.        Past Medical History:  Diagnosis Date   Arthritis    Coronary artery disease    GERD (gastroesophageal reflux disease)    History of kidney stones    Hypertension    Hypothyroidism     Past Surgical History:  Procedure Laterality Date   ABDOMINAL HYSTERECTOMY     APPENDECTOMY     KNEE SURGERY  11/18/2011   SVT ABLATION N/A 08/21/2022   Procedure: SVT ABLATION;  Surgeon: Vickie Epley, MD;  Location: [place] [facility];  Service: Cardiovascular;  Laterality: N/A;   TONSILLECTOMY     TOTAL KNEE ARTHROPLASTY Left 05/04/2017   Procedure: LEFT TOTAL KNEE ARTHROPLASTY;  Surgeon: Paralee Cancel, MD;  Location: [facility];  Service: Orthopedics;  Laterality: Left;  90 mins   TOTAL KNEE ARTHROPLASTY Right 07/11/2022   Procedure: TOTAL KNEE ARTHROPLASTY;  Surgeon: Netta Cedars, MD;  Location: [facility];  Service: Orthopedics;  Laterality: Right;    Current Medications: No outpatient medications have been marked as taking for the 09/18/22 encounter (Appointment) with Vickie Epley, MD.     Allergies:   Albuterol and Oxycodone   Social History   Socioeconomic History   Marital status: Divorced    Spouse name: Not on file   Number of children: Not on file   Years of education: Not on file   Highest education level: Not on file  Occupational History   Occupation: retired  Tobacco Use   Smoking status: Former    Years: 20.00    Types: Cigarettes    Quit date: 04/28/1989    Years since quitting: 33.4   Smokeless  tobacco: Never  Vaping Use   Vaping Use: Never used  Substance and Sexual Activity   Alcohol use: Yes    Comment: Occas   Drug use: No   Sexual activity: Not on file  Other Topics Concern   Not on file  Social History Narrative   Not on file   Social Determinants of Health   Financial Resource Strain: Not on file  Food Insecurity: Not on file  Transportation Needs: Not on file  Physical Activity: Not on file  Stress: Not on file  Social Connections: Not on file     Family History: The patient's family history includes COPD in her brother and mother; Cancer in her father; Heart disease in her father and mother; Hypertension in her son.  ROS:   Please see the history of present illness.    All other systems reviewed and are negative.  EKGs/Labs/Other Studies Reviewed:    The following studies were reviewed today:  07/18/2022 Echo EF 65 RV normal Trivial MR     Recent Labs: 07/19/2022: ALT 13; Magnesium 1.6; TSH 0.903 07/28/2022: BUN 12; Creatinine, Ser 1.01; Hemoglobin 12.6; Platelets 385; Potassium 4.1; Sodium 140  Recent Lipid Panel    Component Value Date/Time   CHOL 161 09/10/2021 1018   TRIG 77 09/10/2021 1018   HDL  78 09/10/2021 1018   CHOLHDL 2.1 09/10/2021 1018   CHOLHDL 2.7 07/29/2021 0610   VLDL 16 07/29/2021 0610   LDLCALC 68 09/10/2021 1018    Physical Exam:    VS:  There were no vitals taken for this visit.    Wt Readings from Last 3 Encounters:  08/21/22 160 lb (72.6 kg)  07/28/22 159 lb 6.4 oz (72.3 kg)  07/19/22 163 lb 6.4 oz (74.1 kg)     GEN: *** Well nourished, well developed in no acute distress HEENT: Normal NECK: No JVD; No carotid bruits LYMPHATICS: No lymphadenopathy CARDIAC: ***RRR, no murmurs, rubs, gallops RESPIRATORY:  Clear to auscultation without rales, wheezing or rhonchi  ABDOMEN: Soft, non-tender, non-distended MUSCULOSKELETAL:  No edema; No deformity  SKIN: Warm and dry NEUROLOGIC:  Alert and oriented x  3 PSYCHIATRIC:  Normal affect        ASSESSMENT:    1. SVT (supraventricular tachycardia)   2. Chronic diastolic CHF (congestive heart failure) ([place])   3. Primary hypertension    PLAN:    In order of problems listed above:  #SVT Doing well after EP study and ablation.  Continue metoprolol, transition to toprol-xl '25mg'$  PO daily at night***  #Chronic diastolic HF Continue metop, lisinopril  #Hypertension *** goal today.  Recommend checking blood pressures 1-2 times per week at home and recording the values.  Recommend bringing these recordings to the primary care physician.    Follow up 12 months      Total time spent with patient today *** minutes. This includes reviewing records, evaluating the patient and coordinating care.   Medication Adjustments/Labs and Tests Ordered: Current medicines are reviewed at length with the patient today.  Concerns regarding medicines are outlined above.  No orders of the defined types were placed in this encounter.  No orders of the defined types were placed in this encounter.    Signed, Lars Mage, MD, [facility], [facility] 09/18/2022 5:12 AM    Electrophysiology [place]

## 2022-09-18 NOTE — Patient Instructions (Signed)
Medication Instructions:  None  *If you need a refill on your cardiac medications before your next appointment, please call your pharmacy*   Lab Work: none If you have labs (blood work) drawn today and your tests are completely normal, you will receive your results only by: [place] (if you have MyChart) OR A paper copy in the mail If you have any lab test that is abnormal or we need to change your treatment, we will call you to review the results.   Testing/Procedures: None   Follow-Up: At [facility], you and your health needs are our priority.  As part of our continuing mission to provide you with exceptional heart care, we have created designated Provider Care Teams.  These Care Teams include your primary Cardiologist (physician) and Advanced Practice Providers (APPs -  Physician Assistants and Nurse Practitioners) who all work together to provide you with the care you need, when you need it.  We recommend signing up for the patient portal called "MyChart".  Sign up information is provided on this After Visit Summary.  MyChart is used to connect with patients for Virtual Visits (Telemedicine).  Patients are able to view lab/test results, encounter notes, upcoming appointments, etc.  Non-urgent messages can be sent to your provider as well.   To learn more about what you can do with MyChart, go to [email or web address].    Your next appointment:   As needed   The format for your next appointment:   In Person  Provider:   Lars Mage, MD    Other Instructions None   Important Information About Sugar

## 2022-10-21 NOTE — Progress Notes (Signed)
Cardiology Office Note:   Date:  10/24/2022  NAME:  Stephanie Daniel    MRN: [id number] DOB:  [birth year]   PCP:  [name]  Cardiologist:  Evalina Field, MD  Electrophysiologist:  Vickie Epley, MD   Referring MD: Kelton Pillar, MD   Chief Complaint  Patient presents with   Follow-up        History of Present Illness:   Stephanie Daniel is a 71 y.o. female with a hx of CAD, hypertension, SVT status post ablation who presents for follow-up.  She reports that she is doing well.  Status post SVT ablation.  Had a visit to the emergency room due to dehydration.  Troponins are minimally elevated.  Echo normal.  She was managed medically.  She has had a reassuring coronary CTA.  Nuclear medicine stress test was also normal.  Suspect she may have small vessel disease but no symptoms of angina.  Blood pressure is elevated today but she reports it is well-controlled at home.  She is without major complaints today.  She is on aspirin and cholesterol medication.    Problem List CAD -Calcium score 2165 (99th percentile) -RCA 25-49% FFR CT 0.82 -mid LAD 70-99% FFR CT 0.87 -RI 50-69%  -LCX 25-49% FFR CT 0.79 2. HLD -T chol 178, HDL 92, LDL 69, TG 94 3. HTN -white coat  4. SVT -typical AVNRT ablation 08/21/2022  Past Medical History: Past Medical History:  Diagnosis Date   Arthritis    Coronary artery disease    GERD (gastroesophageal reflux disease)    History of kidney stones    Hypertension    Hypothyroidism     Past Surgical History: Past Surgical History:  Procedure Laterality Date   ABDOMINAL HYSTERECTOMY     APPENDECTOMY     KNEE SURGERY  11/18/2011   SVT ABLATION N/A 08/21/2022   Procedure: SVT ABLATION;  Surgeon: Vickie Epley, MD;  Location: [facility];  Service: Cardiovascular;  Laterality: N/A;   TONSILLECTOMY     TOTAL KNEE ARTHROPLASTY Left 05/04/2017   Procedure: LEFT TOTAL KNEE ARTHROPLASTY;  Surgeon: Paralee Cancel, MD;   Location: [facility];  Service: Orthopedics;  Laterality: Left;  90 mins   TOTAL KNEE ARTHROPLASTY Right 07/11/2022   Procedure: TOTAL KNEE ARTHROPLASTY;  Surgeon: Netta Cedars, MD;  Location: [facility];  Service: Orthopedics;  Laterality: Right;    Current Medications: Current Meds  Medication Sig   acyclovir (ZOVIRAX) 400 MG tablet Take 400 mg by mouth 2 (two) times daily.   aspirin EC 81 MG tablet Take 81 mg by mouth 2 (two) times daily. Swallow whole.   atorvastatin (LIPITOR) 40 MG tablet Take 1 tablet (40 mg total) by mouth daily.   buPROPion (WELLBUTRIN XL) 150 MG 24 hr tablet Take 150 mg by mouth daily.   gabapentin (NEURONTIN) 300 MG capsule Take 300 mg by mouth at bedtime.   HYDROcodone-acetaminophen (NORCO/VICODIN) 5-325 MG tablet Take 1 tablet by mouth every 6 (six) hours as needed for moderate pain.   levothyroxine (SYNTHROID, LEVOTHROID) 50 MCG tablet Take 50 mcg by mouth daily before breakfast.   lisinopril (ZESTRIL) 20 MG tablet Take 1 tablet (20 mg total) by mouth daily.   metoprolol tartrate (LOPRESSOR) 25 MG tablet Take 0.5 tablets (12.5 mg total) by mouth daily as needed (HR>100).   nitroGLYCERIN (NITROSTAT) 0.4 MG SL tablet Place 1 tablet (0.4 mg total) under the tongue every 5 (five) minutes x 3 doses as  needed for chest pain.   omeprazole (PRILOSEC) 40 MG capsule Take 40 mg by mouth daily.   Potassium 99 MG TABS Take 198 mg by mouth at bedtime.     Allergies:    Albuterol and Oxycodone   Social History: Social History   Socioeconomic History   Marital status: Divorced    Spouse name: Not on file   Number of children: Not on file   Years of education: Not on file   Highest education level: Not on file  Occupational History   Occupation: retired  Tobacco Use   Smoking status: Former    Years: 20.00    Types: Cigarettes    Quit date: 04/28/1989    Years since quitting: 33.5   Smokeless tobacco: Never  Vaping Use   Vaping Use: Never used  Substance and Sexual  Activity   Alcohol use: Yes    Comment: Occas   Drug use: No   Sexual activity: Not on file  Other Topics Concern   Not on file  Social History Narrative   Not on file   Social Determinants of Health   Financial Resource Strain: Not on file  Food Insecurity: Not on file  Transportation Needs: Not on file  Physical Activity: Not on file  Stress: Not on file  Social Connections: Not on file     Family History: The patient's family history includes COPD in her brother and mother; Cancer in her father; Heart disease in her father and mother; Hypertension in her son.  ROS:   All other ROS reviewed and negative. Pertinent positives noted in the HPI.     EKGs/Labs/Other Studies Reviewed:   The following studies were personally reviewed by me today:    TTE 07/18/2022  1. Left ventricular ejection fraction, by estimation, is 65 to 70%. The  left ventricle has normal function. The left ventricle has no regional  wall motion abnormalities. Left ventricular diastolic parameters are  consistent with Grade I diastolic  dysfunction (impaired relaxation).   2. Right ventricular systolic function is normal. The right ventricular  size is normal.   3. Left atrial size was mildly dilated.   4. The mitral valve is normal in structure. Trivial mitral valve  regurgitation. No evidence of mitral stenosis.   5. The aortic valve is normal in structure. There is mild calcification  of the aortic valve. Aortic valve regurgitation is trivial. No aortic  stenosis is present.   6. The inferior vena cava is normal in size with greater than 50%  respiratory variability, suggesting right atrial pressure of 3 mmHg.   7. There was prominent anterior fat pad.   NM Stress 05/23/2022    Findings are consistent with no prior ischemia. The study is low risk.   No ST deviation was noted.   Left ventricular function is normal. Nuclear stress EF: 78 %. The left ventricular ejection fraction is hyperdynamic  (>65%). End diastolic cavity size is normal. End systolic cavity size is normal.   No reversible ischemia. LVEF 78% with normal wall motion. This is a low risk study. No prior for comparison.  Recent Labs: 07/19/2022: ALT 13; Magnesium 1.6; TSH 0.903 07/28/2022: BUN 12; Creatinine, Ser 1.01; Hemoglobin 12.6; Platelets 385; Potassium 4.1; Sodium 140   Recent Lipid Panel    Component Value Date/Time   CHOL 161 09/10/2021 1018   TRIG 77 09/10/2021 1018   HDL 78 09/10/2021 1018   CHOLHDL 2.1 09/10/2021 1018   CHOLHDL 2.7 07/29/2021 0610  VLDL 16 07/29/2021 0610   LDLCALC 68 09/10/2021 1018    Physical Exam:   VS:  BP (!) 167/83   Pulse 80   Ht '5\' 4"'$  (1.626 m)   Wt 155 lb 12.8 oz (70.7 kg)   SpO2 95%   BMI 26.74 kg/m    Wt Readings from Last 3 Encounters:  10/24/22 155 lb 12.8 oz (70.7 kg)  09/18/22 158 lb 6.4 oz (71.8 kg)  08/21/22 160 lb (72.6 kg)    General: Well nourished, well developed, in no acute distress Head: Atraumatic, normal size  Eyes: PEERLA, EOMI  Neck: Supple, no JVD Endocrine: No thryomegaly Cardiac: Normal S1, S2; RRR; no murmurs, rubs, or gallops Lungs: Clear to auscultation bilaterally, no wheezing, rhonchi or rales  Abd: Soft, nontender, no hepatomegaly  Ext: No edema, pulses 2+ Musculoskeletal: No deformities, BUE and BLE strength normal and equal Skin: Warm and dry, no rashes   Neuro: Alert and oriented to person, place, time, and situation, CNII-XII grossly intact, no focal deficits  Psych: Normal mood and affect   ASSESSMENT:   BEAULAH ROMANEK is a 71 y.o. female who presents for the following: 1. Coronary artery disease involving native coronary artery of native heart without angina pectoris   2. Agatston coronary artery calcium score greater than 400   3. Mixed hyperlipidemia   4. SVT (supraventricular tachycardia)     PLAN:   1. Coronary artery disease involving native coronary artery of native heart without angina pectoris 2. Agatston  coronary artery calcium score greater than 400 3. Mixed hyperlipidemia -Elevated coronary calcium score.  Coronary CTA negative by CT FFR.  Recent nuclear medicine stress test normal.  Overall all of her findings have been low risk.  No symptoms of angina.  Will continue aspirin and statin therapy.  Most recent LDL cholesterol is at goal.  Her echo was normal.  She will see [place] yearly.  4. SVT (supraventricular tachycardia) -Status post SVT ablation.  No recurrence.  She will see [place] yearly.  Disposition: Return in about 1 year (around 10/25/2023).  Medication Adjustments/Labs and Tests Ordered: Current medicines are reviewed at length with the patient today.  Concerns regarding medicines are outlined above.  No orders of the defined types were placed in this encounter.  No orders of the defined types were placed in this encounter.   Patient Instructions  Medication Instructions:  The current medical regimen is effective;  continue present plan and medications.  *If you need a refill on your cardiac medications before your next appointment, please call your pharmacy*   Follow-Up: At [facility], you and your health needs are our priority.  As part of our continuing mission to provide you with exceptional heart care, we have created designated Provider Care Teams.  These Care Teams include your primary Cardiologist (physician) and Advanced Practice Providers (APPs -  Physician Assistants and Nurse Practitioners) who all work together to provide you with the care you need, when you need it.  We recommend signing up for the patient portal called "MyChart".  Sign up information is provided on this After Visit Summary.  MyChart is used to connect with patients for Virtual Visits (Telemedicine).  Patients are able to view lab/test results, encounter notes, upcoming appointments, etc.  Non-urgent messages can be sent to your provider as well.   To learn more about what you can do with  MyChart, go to [email or web address].    Your next appointment:   12 month(s)  The  format for your next appointment:   In Person  Provider:   Evalina Field, MD  or Sande Rives, PA-C, or Almyra Deforest, PA-C          Time Spent with Patient: I have spent a total of 25 minutes with patient reviewing hospital notes, telemetry, EKGs, labs and examining the patient as well as establishing an assessment and plan that was discussed with the patient.  > 50% of time was spent in direct patient care.  Signed, Addison Naegeli. Audie Box, MD, [facility]  [address], [place], [place] [address] [phone]  10/24/2022 1:43 PM

## 2022-10-24 ENCOUNTER — Encounter: Payer: Self-pay | Admitting: Cardiovascular Disease

## 2022-10-24 ENCOUNTER — Ambulatory Visit: Payer: Medicare Other | Attending: Cardiovascular Disease | Admitting: Cardiovascular Disease

## 2022-10-24 VITALS — BP 167/83 | HR 80 | Ht 64.0 in | Wt 155.8 lb

## 2022-10-24 DIAGNOSIS — R931 Abnormal findings on diagnostic imaging of heart and coronary circulation: Secondary | ICD-10-CM

## 2022-10-24 DIAGNOSIS — E782 Mixed hyperlipidemia: Secondary | ICD-10-CM | POA: Diagnosis not present

## 2022-10-24 DIAGNOSIS — I471 Supraventricular tachycardia, unspecified: Secondary | ICD-10-CM

## 2022-10-24 DIAGNOSIS — I2510 Atherosclerotic heart disease of native coronary artery without angina pectoris: Secondary | ICD-10-CM

## 2022-10-24 NOTE — Patient Instructions (Signed)
Medication Instructions:  The current medical regimen is effective;  continue present plan and medications.  *If you need a refill on your cardiac medications before your next appointment, please call your pharmacy*   Follow-Up: At [facility], you and your health needs are our priority.  As part of our continuing mission to provide you with exceptional heart care, we have created designated Provider Care Teams.  These Care Teams include your primary Cardiologist (physician) and Advanced Practice Providers (APPs -  Physician Assistants and Nurse Practitioners) who all work together to provide you with the care you need, when you need it.  We recommend signing up for the patient portal called "MyChart".  Sign up information is provided on this After Visit Summary.  MyChart is used to connect with patients for Virtual Visits (Telemedicine).  Patients are able to view lab/test results, encounter notes, upcoming appointments, etc.  Non-urgent messages can be sent to your provider as well.   To learn more about what you can do with MyChart, go to [email or web address].    Your next appointment:   12 month(s)  The format for your next appointment:   In Person  Provider:   Evalina Field, MD  or Sande Rives, PA-C, or Almyra Deforest, [place]

## 2022-10-26 ENCOUNTER — Other Ambulatory Visit: Payer: Self-pay | Admitting: Home Health

## 2022-11-02 ENCOUNTER — Other Ambulatory Visit: Payer: Self-pay | Admitting: Cardiovascular Disease

## 2022-11-06 DIAGNOSIS — M25511 Pain in right shoulder: Secondary | ICD-10-CM | POA: Diagnosis not present

## 2022-11-06 DIAGNOSIS — M25512 Pain in left shoulder: Secondary | ICD-10-CM | POA: Diagnosis not present

## 2022-11-08 ENCOUNTER — Other Ambulatory Visit: Payer: Self-pay | Admitting: Home Health

## 2022-12-15 ENCOUNTER — Other Ambulatory Visit: Payer: Self-pay | Admitting: Otolaryngology

## 2022-12-15 DIAGNOSIS — J329 Chronic sinusitis, unspecified: Secondary | ICD-10-CM

## 2022-12-23 NOTE — Progress Notes (Unsigned)
Cardiology Clinic Note   Patient Name: Stephanie Daniel Date of Encounter: 12/25/2022  Primary Care Provider:  [name] Primary Cardiologist:  Evalina Field, MD  Patient Profile    Stephanie Daniel 72 year old female presents to the clinic today for evaluation of her coronary artery disease and chest discomfort.  Past Medical History    Past Medical History:  Diagnosis Date   Arthritis    Coronary artery disease    GERD (gastroesophageal reflux disease)    History of kidney stones    Hypertension    Hypothyroidism    Past Surgical History:  Procedure Laterality Date   ABDOMINAL HYSTERECTOMY     APPENDECTOMY     KNEE SURGERY  11/18/2011   SVT ABLATION N/A 08/21/2022   Procedure: SVT ABLATION;  Surgeon: Vickie Epley, MD;  Location: [place] [facility];  Service: Cardiovascular;  Laterality: N/A;   TONSILLECTOMY     TOTAL KNEE ARTHROPLASTY Left 05/04/2017   Procedure: LEFT TOTAL KNEE ARTHROPLASTY;  Surgeon: Paralee Cancel, MD;  Location: [facility];  Service: Orthopedics;  Laterality: Left;  90 mins   TOTAL KNEE ARTHROPLASTY Right 07/11/2022   Procedure: TOTAL KNEE ARTHROPLASTY;  Surgeon: Netta Cedars, MD;  Location: [facility];  Service: Orthopedics;  Laterality: Right;    Allergies  Allergies  Allergen Reactions   Albuterol Other (See Comments)    Respiratory distress   Oxycodone Nausea And Vomiting    History of Present Illness    Stephanie Daniel has a PMH of hypothyroidism, status post left TKA, obesity, coronary artery disease, and essential hypertension.  She developed acute chest pain 07/29/2021.  Her troponins were negative and her EKG was normal.  Coronary CTA was performed and showed diffuse CAD with nonobstructive disease by FFR.  Medical management was recommended.  She was seen by Dr. Audie Box 08/26/2021.  During that time she denied further episodes of chest discomfort.  She reported she was doing well.  She was unable to exercise due to  back pain.  She was encouraged to start using a exercise bike.  Her blood pressure was 118/82.  Her hydrochlorothiazide was discontinued.  Her lipid panel 09/10/2021 showed an LDL of 68.  She contacted the nurse triage line on 03/28/2022.  She reported 3 episodes of chest heaviness and fast heartbeat.  At the time of the call she had no symptoms.  It was felt that she may need left heart cath due to her symptoms.  She presented to the clinic 04/01/2022 for evaluation and stated she  had 3 episodes of dizziness, increased heart rate, headache, clamminess.  She presented with her granddaughter.  The first episode was 5 to 6 weeks ago.  She had a repeat episode 3 weeks ago and then another episode about 1 week ago.  All of these episodes were at rest.  She reported that she took an aspirin and sublingual nitro and did not receive much relief.  The episodes resolved after about 30-45 minutes.  She noted 1 episode was in the presence of a single beer.  We reviewed her coronary CTA and FFR.  She expressed understanding.  I will ordered a CBC, BMP, TSH, free T4, 14-day cardiac event monitor, have her increase her p.o. hydration, and plan follow-up for 6 to 8 weeks.  We reviewed her EKG which shows normal sinus rhythm 91 bpm.  Blood pressure was 134/82.  She was seen in follow-up by Dr. Audie Box on 10/24/2022.  She  was doing well.  She was status post SVT ablation 08/21/2022.  She was noted to have a visit to the emergency room due to dehydration.  Her troponins were minimally elevated.  Her echocardiogram was normal.  Medical management was recommended.  She underwent coronary CTA which showed RCA 25-49% with FFR CT 0.82, mid LAD 70-99% FFR 0.87, RI 50-69%, circumflex 25-49% FFR CT 0.79.  It was felt that she may have small vessel disease but she denied symptoms of angina.  She was noted to have elevated blood pressure which was well-controlled at home.  Her aspirin and atorvastatin were continued.  She presents to the  clinic today for follow-up evaluation and states she has been sick for almost a month.  She initially had a sinus infection which required antibiotics and a second round of prednisone.  She then developed the flu.  She continues to have a dry cough.  She reports that she is been very sedentary during her period of illness.  We reviewed expectations for recovery.  If she does not continue to improve with her cough over the next 2 to 3 weeks I recommend that she follow-up with her PCP.  We used shared decision-making to talk about whether she would need an echocardiogram or not.  She wishes to defer echocardiogram at this time.  If she does not continue to improve I will order echocardiogram.  We will plan follow-up in 6 months.  I encouraged her to eat more frequent small meals, continue hydration and continue to increase her physical activity.  Today she denies chest pain, shortness of breath, lower extremity edema, palpitations, melena, hematuria, hemoptysis, diaphoresis, weakness, presyncope, syncope, orthopnea, and PND.   Home Medications    Prior to Admission medications   Medication Sig Start Date End Date Taking? Authorizing Provider  acyclovir (ZOVIRAX) 400 MG tablet Take 400 mg by mouth 2 (two) times daily. 02/23/17   [provider]  aspirin EC 81 MG tablet Take 1 tablet (81 mg total) by mouth daily. Swallow whole. 07/29/21 07/29/22  Margie Billet, NP  atorvastatin (LIPITOR) 40 MG tablet Take 1 tablet (40 mg total) by mouth daily. 08/26/21   Geralynn Rile, MD  buPROPion (WELLBUTRIN XL) 150 MG 24 hr tablet Take 150 mg by mouth daily.    [provider]  butalbital-acetaminophen-caffeine (FIORICET WITH CODEINE) 50-325-40-30 MG capsule Take 1 capsule by mouth every 6 (six) hours as needed. For migraine headaches 02/11/17   [provider]  gabapentin (NEURONTIN) 300 MG capsule Take 300 mg by mouth at bedtime.    [provider]  HYDROcodone-acetaminophen  (NORCO/VICODIN) 5-325 MG tablet Take 1 tablet by mouth 2 (two) times daily as needed. 08/08/21   [provider]  levothyroxine (SYNTHROID, LEVOTHROID) 50 MCG tablet Take 50 mcg by mouth daily before breakfast. 02/17/17   [provider]  lisinopril (ZESTRIL) 20 MG tablet Take 1 tablet (20 mg total) by mouth daily. 08/26/21   O'NealCassie Freer, MD  metoprolol succinate (TOPROL XL) 25 MG 24 hr tablet Take 1 tablet (25 mg total) by mouth daily. 08/26/21 08/26/22  O'NealCassie Freer, MD  nitroGLYCERIN (NITROSTAT) 0.4 MG SL tablet Place 1 tablet (0.4 mg total) under the tongue every 5 (five) minutes x 3 doses as needed for chest pain. 07/29/21   Margie Billet, NP  omeprazole (PRILOSEC) 40 MG capsule Take 40 mg by mouth daily.    [provider]    Family History    Family History  Problem Relation Age of Onset   COPD Mother    Heart disease Mother    Heart disease Father    Cancer Father    COPD Brother    Hypertension Son    She indicated that her mother is deceased. She indicated that her father is deceased. She indicated that her sister is alive. She indicated that her brother is deceased. She indicated that her maternal grandmother is deceased. She indicated that her maternal grandfather is deceased. She indicated that her paternal grandmother is deceased. She indicated that her paternal grandfather is deceased. She indicated that her son is alive. She indicated that her maternal aunt is alive. She indicated that her maternal uncle is alive. She indicated that only one of her three paternal aunts is alive. She indicated that her paternal uncle is alive.  Social History    Social History   Socioeconomic History   Marital status: Divorced    Spouse name: Not on file   Number of children: Not on file   Years of education: Not on file   Highest education level: Not on file  Occupational History   Occupation: retired  Tobacco Use   Smoking status: Former     Years: 20.00    Types: Cigarettes    Quit date: 04/28/1989    Years since quitting: 33.6   Smokeless tobacco: Never  Vaping Use   Vaping Use: Never used  Substance and Sexual Activity   Alcohol use: Yes    Comment: Occas   Drug use: No   Sexual activity: Not on file  Other Topics Concern   Not on file  Social History Narrative   Not on file   Social Determinants of Health   Financial Resource Strain: Not on file  Food Insecurity: Not on file  Transportation Needs: Not on file  Physical Activity: Not on file  Stress: Not on file  Social Connections: Not on file  Intimate Partner Violence: Not on file     Review of Systems    General:  No chills, fever, night sweats or weight changes.  Cardiovascular:  No chest pain, dyspnea on exertion, edema, orthopnea, palpitations, paroxysmal nocturnal dyspnea. Dermatological: No rash, lesions/masses Respiratory: No cough, dyspnea Urologic: No hematuria, dysuria Abdominal:   No nausea, vomiting, diarrhea, bright red blood per rectum, melena, or hematemesis Neurologic:  No visual changes, wkns, changes in mental status. All other systems reviewed and are otherwise negative except as noted above.  Physical Exam    VS:  BP 112/68   Pulse 90   Ht '5\' 4"'$  (1.626 m)   Wt 152 lb 3.2 oz (69 kg)   BMI 26.13 kg/m  , BMI Body mass index is 26.13 kg/m. GEN: Well nourished, well developed, in no acute distress. HEENT: normal. Neck: Supple, no JVD, carotid bruits, or masses. Cardiac: RRR, no murmurs, rubs, or gallops. No clubbing, cyanosis, edema.  Radials/DP/PT 2+ and equal bilaterally.  Respiratory:  Respirations regular and unlabored, clear to auscultation bilaterally. GI: Soft, nontender, nondistended, BS + x 4. MS: no deformity or atrophy. Skin: warm and dry, no rash. Neuro:  Strength and sensation are intact. Psych: Normal affect.  Accessory Clinical Findings    Recent Labs: 07/19/2022: ALT 13; Magnesium 1.6; TSH 0.903 07/28/2022:  BUN 12; Creatinine, Ser 1.01; Hemoglobin 12.6; Platelets 385; Potassium 4.1; Sodium 140   Recent Lipid Panel    Component Value Date/Time   CHOL 161 09/10/2021 1018   TRIG 77 09/10/2021 1018   HDL  78 09/10/2021 1018   CHOLHDL 2.1 09/10/2021 1018   CHOLHDL 2.7 07/29/2021 0610   VLDL 16 07/29/2021 0610   LDLCALC 68 09/10/2021 1018    ECG personally reviewed by me today-normal sinus rhythm incomplete right bundle branch block 90 bpm   EKG 04/01/2022 normal sinus rhythm incomplete right bundle branch block 91 bpm- No acute changes  Coronary CTA 07/29/2021 FINDINGS: A 120 kV prospective scan was triggered in the descending thoracic aorta at 111 HU's. Axial non-contrast 3 mm slices were carried out through the heart. The data set was analyzed on a dedicated work station and scored using the Dunn. Gantry rotation speed was 250 msecs and collimation was .6 mm. No beta blockade and 0.8 mg of sl NTG was given. The 3D data set was reconstructed in 5% intervals of the 67-82 % of the R-R cycle. Diastolic phases were analyzed on a dedicated work station using MPR, MIP and VRT modes. The patient received 80 cc of contrast.   Aorta: Normal size. Ascending aorta 3.5 cm. Calcification in the aortic root and descending aorta. No dissection.   Aortic Valve:  Trileaflet.  No calcifications.   Coronary Arteries:  Normal coronary origin.  Right dominance.   RCA is a large dominant artery that gives rise to PDA and 3 PLV branches. There is diffuse mild (25-49%) calcified plaque in the proximal and mid RCA. There is minimal (<25%) plaque in the distal RCA.   Left main is a large artery that gives rise to LAD, RI, and LCX arteries. There is mild (25-49%) calcified plaque and the distal LM.   LAD is a large vessel that is diffusely diseased. There is severe (>70%) calcified plaque proximally and in the mid LAD just distal to D1. D1 has moderate (50-69%) calcified plaque.   RI is a  heavily calcified vessel with moderate (50-69%) plaque proximally.   LCX is a non-dominant artery that gives rise to one large OM1 branch. There is mild (25-49%) calcified plaque proximally and minimal (<25%) calcified plaque distally. There are two small OM vessels without significant plaque.   Coronary Calcium Score:   Left main: 3.89   Left anterior descending artery: 870   Left circumflex artery: 418   Right coronary artery: 873   Total: 2165   Percentile: 99th   Other findings:   Normal pulmonary vein drainage into the left atrium.   Normal let atrial appendage without a thrombus.   Normal size of the pulmonary artery.   IMPRESSION: 1. Coronary calcium score of 2165. This was 99th percentile for age-, race-, and sex-matched controls.   2. Normal coronary origin with right dominance.   3. Diffuse coronary atherosclerosis with mild plaque in the distal LM, RCA and LCX.   4. Severe (>70%) plaque in the LAD. However, this is heavily calcified and may be over estimated due to blooming artifact. Will send the study for FFRct.   5.  CAD-RADS 4.   6.  Atherosclerosis of the aorta.   Skeet Latch, MD     Electronically Signed   By: Skeet Latch M.D.   On: 07/29/2021 12:13  FINDINGS: FFRct analysis was performed on the original cardiac CT angiogram dataset. Diagrammatic representation of the FFRct analysis is provided in a separate PDF document in PACS. This dictation was created using the PDF document and an interactive 3D model of the results. 3D model is not available in the EMR/PACS. Normal FFR range is >0.80.   1. LM: FFRct 1.0 (  insignificant)   2. LAD: FFRct 0.97 proximal, 0.87 mid, 0.78 distal (insignificant). D1 FFRct 0.86 (insignificant)   3. LCX: FFRct 0.87 proximal, 0.79 mid (insignificant)   4. RCA: FFRct 0.96 proximal, 0.82 mid, 0.75 distal (insignificant)   IMPRESSION: 1. Diffuse coronary atherosclerosis with no evidence of  obstructive coronary disease.   2. Recommend aggressive risk factor modification, including LDL goal <70.   Tiffany C. Oval Linsey, MD     Electronically Signed   By: Skeet Latch M.D.   On: 07/29/2021 14:51  Assessment & Plan   1.  Fatigue/ DOE-notes that January 1 she developed a sinus infection and had a course of antibiotics.  She also had a course of prednisone x 2.  She then caught the flu and notes body aches and temperature changes.  She now notices some increased work of breathing.  Over the course of the month she had decreased physical activity.  I offered echo and wishes to defer at this time.  I recommended that she follow-up with her PCP if she does not continue to improve over the next 2 to 3 weeks.  I will plan for echocardiogram if she has not improved in 1 month. Cbc, BMP  Palpitations/dizziness/increased heart rate-denies palpitations post SVT ablation (08/21/2022).   Continue metoprolol Heart healthy low-sodium diet Increase physical activity as tolerated Follows with Dr. Quentin Ore  Coronary artery disease-no chest pain today.  Underwent coronary CTA with FFR 9/22.  She was noted to have nonobstructive coronary disease and reassuring FFR study. Continue aspirin, atorvastatin, metoprolol Heart healthy low-sodium diet Increase physical activity as tolerated  Mixed hyperlipidemia-LDL 69 on 3/23 Continue atorvastatin, aspirin Heart healthy low-sodium high-fiber diet Increase physical activity as tolerated  Essential hypertension-BP today 112/68.  Well-controlled at home. Continue lisinopril, metoprolol Heart healthy low-sodium diet-salty 6 given Increase physical activity as tolerated  Disposition: Follow-up with Dr.O'neal or me in 6.  Jossie Ng. Piper Hassebrock NP-C    12/25/2022, 11:09 AM [address] Office [phone] Fax [phone]  Notice: This dictation was prepared with Dragon dictation along with  smaller phrase technology. Any transcriptional errors that result from this process are unintentional and may not be corrected upon review.  I spent 14 minutes examining this patient, reviewing medications, and using patient centered shared decision making involving her cardiac care.  Prior to her visit I spent greater than 20 minutes reviewing her past medical history,  medications, and prior cardiac tests.

## 2022-12-25 ENCOUNTER — Encounter: Payer: Self-pay | Admitting: General Practice

## 2022-12-25 ENCOUNTER — Ambulatory Visit: Payer: Medicare Other | Attending: General Practice | Admitting: General Practice

## 2022-12-25 VITALS — BP 112/68 | HR 90 | Ht 64.0 in | Wt 152.2 lb

## 2022-12-25 DIAGNOSIS — E782 Mixed hyperlipidemia: Secondary | ICD-10-CM | POA: Diagnosis present

## 2022-12-25 DIAGNOSIS — R002 Palpitations: Secondary | ICD-10-CM | POA: Diagnosis present

## 2022-12-25 DIAGNOSIS — I10 Essential (primary) hypertension: Secondary | ICD-10-CM | POA: Diagnosis present

## 2022-12-25 DIAGNOSIS — I2510 Atherosclerotic heart disease of native coronary artery without angina pectoris: Secondary | ICD-10-CM | POA: Insufficient documentation

## 2022-12-25 LAB — CBC WITH DIFFERENTIAL/PLATELET

## 2022-12-25 NOTE — Patient Instructions (Signed)
Medication Instructions:  Your physician recommends that you continue on your current medications as directed. Please refer to the Current Medication list given to you today.  *If you need a refill on your cardiac medications before your next appointment, please call your pharmacy*   Lab Work: Your physician recommends that you return for lab work in: [place] (CBC/BMET)  If you have labs (blood work) drawn today and your tests are completely normal, you will receive your results only by: [place] (if you have MyChart) OR A paper copy in the mail If you have any lab test that is abnormal or we need to change your treatment, we will call you to review the results.   Testing/Procedures: none   Follow-Up: At [facility], you and your health needs are our priority.  As part of our continuing mission to provide you with exceptional heart care, we have created designated Provider Care Teams.  These Care Teams include your primary Cardiologist (physician) and Advanced Practice Providers (APPs -  Physician Assistants and Nurse Practitioners) who all work together to provide you with the care you need, when you need it.  We recommend signing up for the patient portal called "MyChart".  Sign up information is provided on this After Visit Summary.  MyChart is used to connect with patients for Virtual Visits (Telemedicine).  Patients are able to view lab/test results, encounter notes, upcoming appointments, etc.  Non-urgent messages can be sent to your provider as well.   To learn more about what you can do with MyChart, go to [email or web address].    Your next appointment:   6 month(s)  Provider:   Coletta Memos, FNP  OR Evalina Field, MD will plan to see you again in 6 month(s).   Other Instructions -Eat small meals throughout the day and INCREASE hydration  - If not feeling 80% better in next 3 weeks follow up with Primary Care if continues after that please contact  [place] office for appointment.

## 2022-12-26 LAB — CBC WITH DIFFERENTIAL/PLATELET
Basophils Absolute: 0.1 10*3/uL (ref 0.0–0.2)
Basos: 1 %
EOS (ABSOLUTE): 0.5 10*3/uL — ABNORMAL HIGH (ref 0.0–0.4)
Eos: 5 %
Hematocrit: 38.4 % (ref 34.0–46.6)
Hemoglobin: 13.2 g/dL (ref 11.1–15.9)
Immature Grans (Abs): 0.2 10*3/uL — ABNORMAL HIGH (ref 0.0–0.1)
Immature Granulocytes: 2 %
Lymphocytes Absolute: 1.3 10*3/uL (ref 0.7–3.1)
Lymphs: 15 %
MCH: 32.1 pg (ref 26.6–33.0)
MCHC: 34.4 g/dL (ref 31.5–35.7)
MCV: 93 fL (ref 79–97)
Monocytes Absolute: 1.2 10*3/uL — ABNORMAL HIGH (ref 0.1–0.9)
Monocytes: 14 %
Neutrophils Absolute: 5.2 10*3/uL (ref 1.4–7.0)
Neutrophils: 63 %
Platelets: 505 10*3/uL — ABNORMAL HIGH (ref 150–450)
RBC: 4.11 x10E6/uL (ref 3.77–5.28)
RDW: 13.6 % (ref 11.7–15.4)
WBC: 8.5 10*3/uL (ref 3.4–10.8)

## 2022-12-26 LAB — BASIC METABOLIC PANEL
BUN/Creatinine Ratio: 13 (ref 12–28)
BUN: 14 mg/dL (ref 8–27)
CO2: 25 mmol/L (ref 20–29)
Calcium: 9.3 mg/dL (ref 8.7–10.3)
Chloride: 98 mmol/L (ref 96–106)
Creatinine, Ser: 1.11 mg/dL — ABNORMAL HIGH (ref 0.57–1.00)
Glucose: 91 mg/dL (ref 70–99)
Potassium: 5.2 mmol/L (ref 3.5–5.2)
Sodium: 137 mmol/L (ref 134–144)
eGFR: 52 mL/min/{1.73_m2} — ABNORMAL LOW (ref >59)

## 2022-12-31 ENCOUNTER — Ambulatory Visit
Admission: RE | Admit: 2022-12-31 | Discharge: 2022-12-31 | Disposition: A | Discharge status: Home / Self Care | Payer: Medicare Other | Source: Ambulatory Visit | Attending: Otolaryngology | Admitting: Otolaryngology

## 2022-12-31 DIAGNOSIS — J329 Chronic sinusitis, unspecified: Secondary | ICD-10-CM

## 2023-07-02 ENCOUNTER — Ambulatory Visit: Payer: Medicare Other | Admitting: Cardiovascular Disease

## 2023-07-02 IMAGING — DX DG CHEST 1V PORT
1 series · 1 of 1 positions shown · non-contrast
Comparison: March 27, 2006

CLINICAL DATA: Chest pain.

EXAM:
PORTABLE CHEST 1 VIEW

[chest]
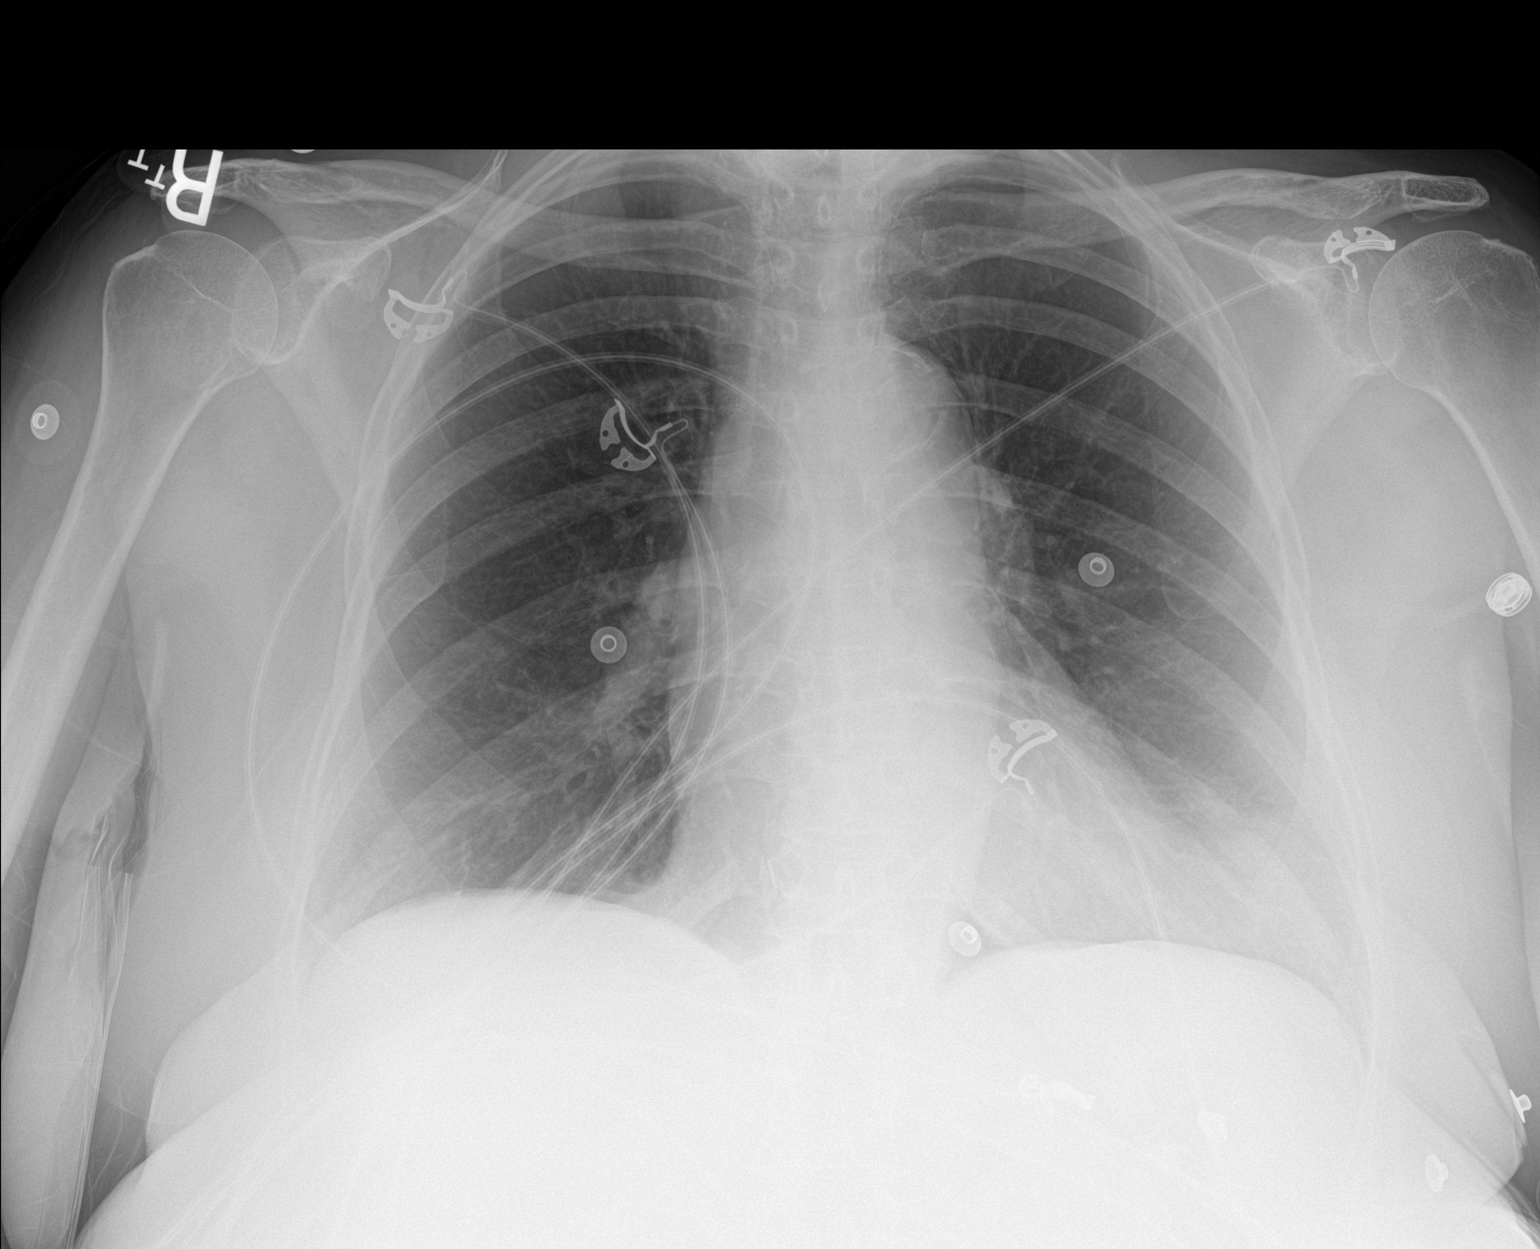

[1 of 1 positions shown; findings below may reference images not displayed]

FINDINGS: Tortuosity and calcific atherosclerotic disease of the aorta.

Cardiomediastinal silhouette is normal. Mediastinal contours appear
intact.

There is no evidence of focal airspace consolidation, pleural
effusion or pneumothorax.

Osseous structures are without acute abnormality. Soft tissues are
grossly normal.
IMPRESSION: 1. No active disease.
2. Tortuosity and calcific atherosclerotic disease of the aorta.

## 2023-07-03 IMAGING — CT CT HEART MORP W/ CTA COR W/ SCORE W/ CA W/CM &/OR W/O CM
4 of 7 series · 8 of 20 positions shown, 9 images · IV contrast (APPLIED)
Comparison: 04/07/2006
COMPARISON: 04/07/2006

Addendum:
EXAM:
OVER-READ INTERPRETATION  CT CHEST

The following report is an over-read performed by radiologist Dr.
Ourari Tiger [REDACTED] on 07/29/2021. This over-read
does not include interpretation of cardiac or coronary anatomy or
pathology. The coronary CTA interpretation by the cardiologist is
attached.
CLINICAL DATA: 70F with hypertension and chest pain.
Cardiac/Coronary  CT
TECHNIQUE: The patient was scanned on a Phillips Force scanner.

[Series 6: best diast 73 % · axial · 0.39mm/px · z∈[-68,-30]mm · 2 of 286 slices shown]
[im 96/286  vessel]
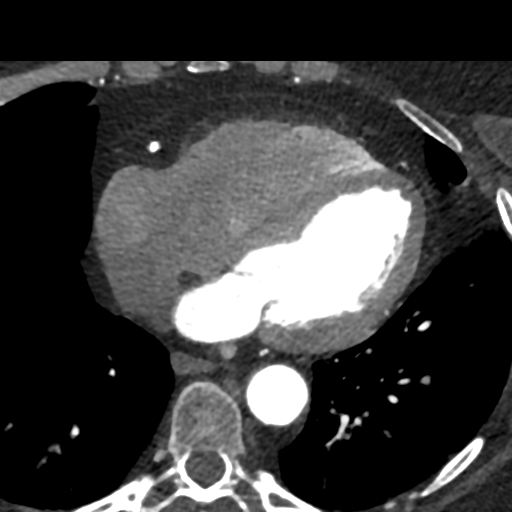
[im 191/286  vessel]
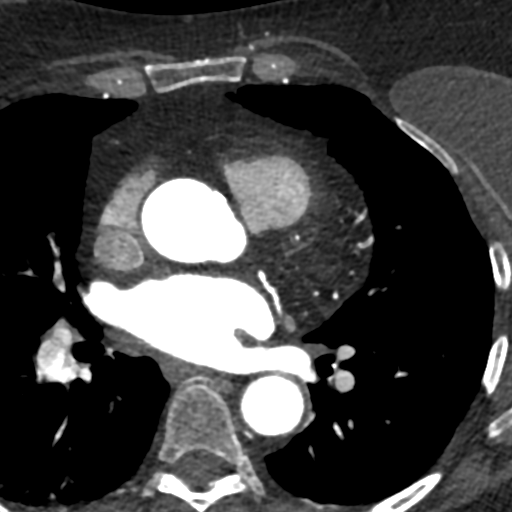

[Series 7: best syst · axial · 0.39mm/px · z∈[-68,-30]mm · 2 of 286 slices shown, 3 images]
[im 96/286  vessel]
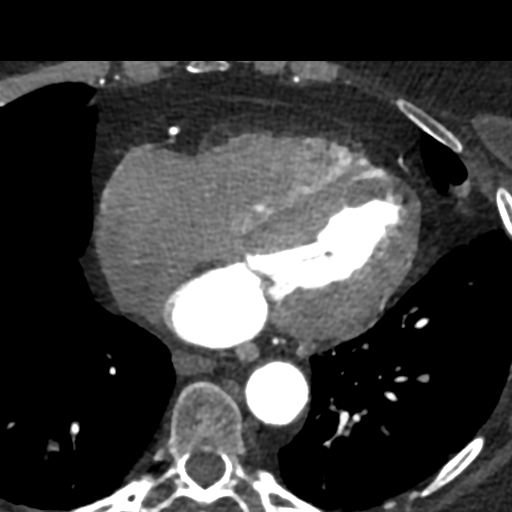
[im 96/286  lung]
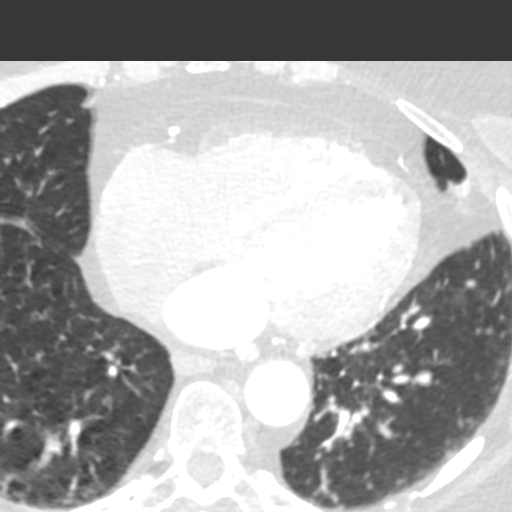
[im 191/286  vessel]
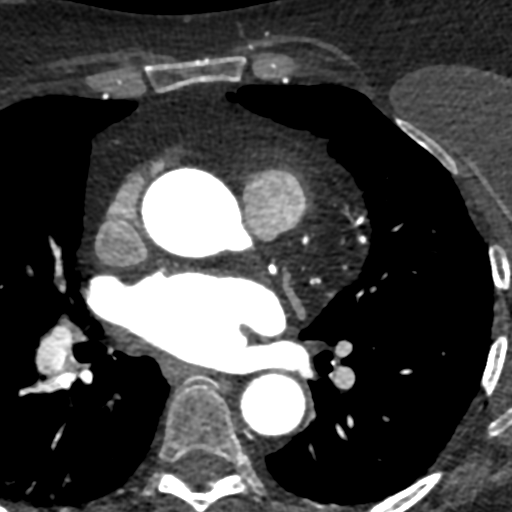

[Series 8: ts diast sharp 73 % · axial · 0.39mm/px · z∈[-68,-30]mm · 2 of 286 slices shown]
[im 96/286  lung]
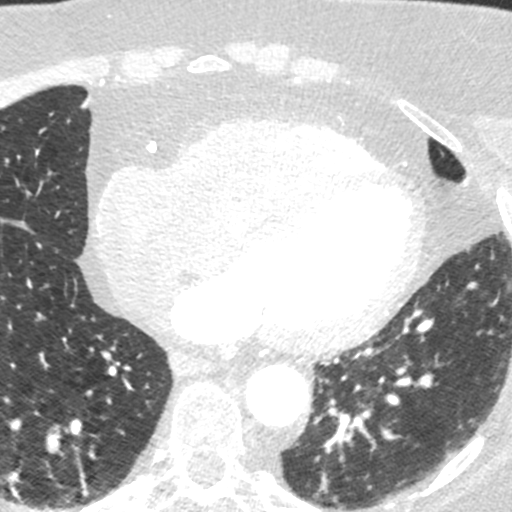
[im 191/286  lung]
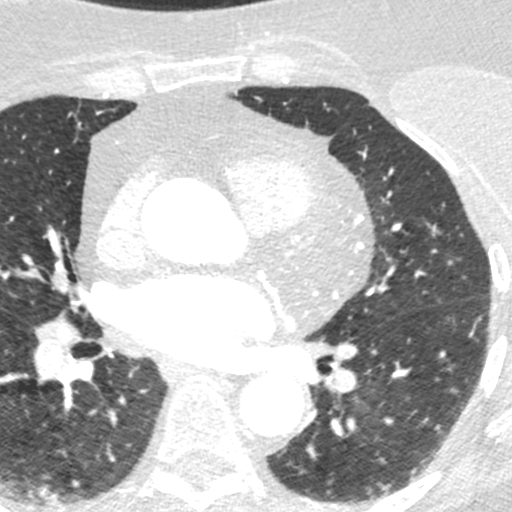

[Series 9: ts syst sharp · axial · 0.39mm/px · z∈[-68,-30]mm · 2 of 286 slices shown]
[im 96/286  lung]
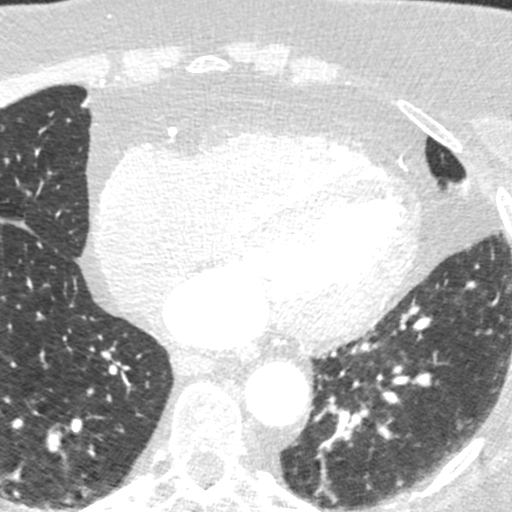
[im 191/286  lung]
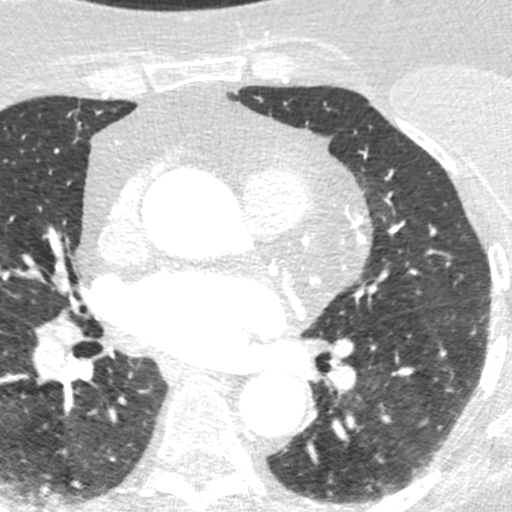

[8 of 20 positions shown; findings below may reference images not displayed]

FINDINGS: Vascular: Heart is normal size. Aorta normal caliber. Scattered
calcified and noncalcified plaque throughout the aorta.

Mediastinum/Nodes: No adenopathy

Lungs/Pleura: No confluent opacity or effusion. Linear scarring
peripherally in the lower lobes.

Upper Abdomen: Imaging into the upper abdomen demonstrates no acute
findings.

Musculoskeletal: Bilateral breast implants. Chest wall soft tissues
are unremarkable. No acute bony abnormality.
IMPRESSION: Scattered calcified and noncalcified aortic plaque.  No aneurysm.

No acute extra cardiac abnormality.
FINDINGS: A 120 kV prospective scan was triggered in the descending thoracic
aorta at 111 HU's. Axial non-contrast 3 mm slices were carried out
through the heart. The data set was analyzed on a dedicated work
station and scored using the Agatson method. Gantry rotation speed
was 250 msecs and collimation was .6 mm. No beta blockade and 0.8 mg
of sl NTG was given. The 3D data set was reconstructed in 5%
intervals of the 67-82 % of the R-R cycle. Diastolic phases were
analyzed on a dedicated work station using MPR, MIP and VRT modes.
The patient received 80 cc of contrast.

Aorta: Normal size. Ascending aorta 3.5 cm. Calcification in the
aortic root and descending aorta. No dissection.

Aortic Valve:  Trileaflet.  No calcifications.

Coronary Arteries:  Normal coronary origin.  Right dominance.

RCA is a large dominant artery that gives rise to PDA and 3 YAW APPIAH
branches. There is diffuse mild (25-49%) calcified plaque in the
proximal and mid RCA. There is minimal (<25%) plaque in the distal
RCA.

Left main is a large artery that gives rise to LAD, RI, and LCX
arteries. There is mild (25-49%) calcified plaque and the distal LM.

LAD is a large vessel that is diffusely diseased. There is severe
(>70%) calcified plaque proximally and in the mid LAD just distal to
D1. D1 has moderate (50-69%) calcified plaque.

RI is a heavily calcified vessel with moderate (50-69%) plaque
proximally.

LCX is a non-dominant artery that gives rise to one large OM1
branch. There is mild (25-49%) calcified plaque proximally and
minimal (<25%) calcified plaque distally. There are two small OM
vessels without significant plaque.

Coronary Calcium Score:

Left main:

Left anterior descending artery: 870

Left circumflex artery: 418

Right coronary artery: 873

Total: 0623

Percentile: 99th

Other findings:

Normal pulmonary vein drainage into the left atrium.

Normal let atrial appendage without a thrombus.

Normal size of the pulmonary artery.
IMPRESSION: 1. Coronary calcium score of 0623. This was 99th percentile for
age-, race-, and sex-matched controls.

2. Normal coronary origin with right dominance.

3. Diffuse coronary atherosclerosis with mild plaque in the distal
LM, RCA and LCX.

4. Severe (>70%) plaque in the LAD. However, this is heavily
calcified and may be over estimated due to blooming artifact. Will
send the study for FFRct.

5.  CAD-RADS 4.

6.  Atherosclerosis of the aorta.

*** End of Addendum ***
EXAM:
OVER-READ INTERPRETATION  CT CHEST

The following report is an over-read performed by radiologist Dr.
Ourari Tiger [REDACTED] on 07/29/2021. This over-read
does not include interpretation of cardiac or coronary anatomy or
pathology. The coronary CTA interpretation by the cardiologist is
attached.
FINDINGS: Vascular: Heart is normal size. Aorta normal caliber. Scattered
calcified and noncalcified plaque throughout the aorta.

Mediastinum/Nodes: No adenopathy

Lungs/Pleura: No confluent opacity or effusion. Linear scarring
peripherally in the lower lobes.

Upper Abdomen: Imaging into the upper abdomen demonstrates no acute
findings.

Musculoskeletal: Bilateral breast implants. Chest wall soft tissues
are unremarkable. No acute bony abnormality.
IMPRESSION: Scattered calcified and noncalcified aortic plaque.  No aneurysm.

No acute extra cardiac abnormality.

## 2023-07-27 NOTE — Progress Notes (Unsigned)
   Cardiology Clinic Note   Date: 07/27/2023 ID: Derrisha, Brundige [birth year], MRN [id number]  Primary Cardiologist:  Reatha Harps, MD  Patient Profile    Stephanie Daniel is a 72 y.o. female who presents to the clinic today for ***    Past medical history significant for: Nonobstructive CAD.  Coronary CTA with FFR 07/29/2021: Calcium score 2165 (99th percentile).  Diffuse coronary atherosclerosis with mild plaque in the distal LM, RCA and LCx.  Severe > 70% plaque in the LAD.  However, this is heavily calcified and may be overestimated due to blooming artifact.  FFR demonstrated no flow-limiting disease. Nuclear stress test 05/23/2022: No reversible ischemia.  Low risk study. Chronic diastolic heart failure. Echo 07/18/2022: EF 65 to 70%.  No RWMA.  Grade I DD.  Normal RV function.  Mild LAE.  Trivial MR/AI.  Mild aortic valve calcification without stenosis. SVT.  14-day ZIO 04/29/2022: Min HR 57 bpm, max HR 210 bpm, average HR 81 bpm.  26 SVT runs, fastest/longest 2 hours 40 minutes with max rate 200 bpm, average 185 bpm.  SVT was detected +/- 45 seconds of symptomatic patient events. SVT ablation 08/21/2022. Hypertension.  Hypothyroidism.      History of Present Illness    Stephanie Daniel was first evaluated by cardiology on 07/28/2021 for chest pain during hospital admission.  Patient continues to be followed by Dr. Flora Lipps and Dr. Lalla Brothers for the above outlined history.  In summary, patient underwent coronary CTA with FFR September 2022 which showed diffuse coronary atherosclerosis with mild plaque in the distal LM, RCA and LCx and heavily calcified LAD determined to be not flow-limiting per FFR.  Showed normal LV function, Grade I DD, low normal RV function, and no significant valvular abnormalities.  Patient was seen in follow-up May 2023 with complaints of palpitations, increased heart rate, dizziness.  She underwent 14-day ZIO which showed 26 runs of SVT fastest/longest 2 hours 40 minutes  with max rate 200 bpm.  She was started on metoprolol to tartrate and referred to EP.  She was evaluated by Dr. Lalla Brothers on 07/28/2022 with decision to undergo SVT ablation which she underwent on 08/21/2022   Today, patient ***   ROS: All other systems reviewed and are otherwise negative except as noted in History of Present Illness.  Studies Reviewed       ***  Risk Assessment/Calculations    {Does this patient have ATRIAL FIBRILLATION?[phone]} No BP recorded.  {Refresh Note OR Click here to enter BP  :1}***        Physical Exam    VS:  There were no vitals taken for this visit. , BMI There is no height or weight on file to calculate BMI.  GEN: Well nourished, well developed, in no acute distress. Neck: No JVD or carotid bruits. Cardiac: *** RRR. No murmurs. No rubs or gallops.   Respiratory:  Respirations regular and unlabored. Clear to auscultation without rales, wheezing or rhonchi. GI: Soft, nontender, nondistended. Extremities: Radials/DP/PT 2+ and equal bilaterally. No clubbing or cyanosis. No edema ***  Skin: Warm and dry, no rash. Neuro: Strength intact.  Assessment & Plan   ***  Disposition: ***     {Are you ordering a CV Procedure (e.g. stress test, cath, DCCV, TEE, etc)?   Press F2        :[id number]}   Signed, Etta Grandchild. Ikeisha Blumberg, DNP, NP-C

## 2023-07-28 ENCOUNTER — Encounter: Payer: Self-pay | Admitting: Student

## 2023-07-28 ENCOUNTER — Ambulatory Visit: Payer: Medicare Other | Attending: Cardiovascular Disease | Admitting: Student

## 2023-07-28 VITALS — BP 136/80 | Ht 64.0 in | Wt 155.2 lb

## 2023-07-28 DIAGNOSIS — I2510 Atherosclerotic heart disease of native coronary artery without angina pectoris: Secondary | ICD-10-CM | POA: Diagnosis not present

## 2023-07-28 DIAGNOSIS — E785 Hyperlipidemia, unspecified: Secondary | ICD-10-CM | POA: Diagnosis present

## 2023-07-28 DIAGNOSIS — I10 Essential (primary) hypertension: Secondary | ICD-10-CM | POA: Insufficient documentation

## 2023-07-28 DIAGNOSIS — I5032 Chronic diastolic (congestive) heart failure: Secondary | ICD-10-CM | POA: Diagnosis not present

## 2023-07-28 DIAGNOSIS — I471 Supraventricular tachycardia, unspecified: Secondary | ICD-10-CM | POA: Insufficient documentation

## 2023-07-28 NOTE — Patient Instructions (Signed)
Medication Instructions:  No medication changes *If you need a refill on your cardiac medications before your next appointment, please call your pharmacy*   Lab Work: No labs orders If you have labs (blood work) drawn today and your tests are completely normal, you will receive your results only by: MyChart Message (if you have MyChart) OR A paper copy in the mail If you have any lab test that is abnormal or we need to change your treatment, we will call you to review the results.    Follow-Up: At [facility], you and your health needs are our priority.  As part of our continuing mission to provide you with exceptional heart care, we have created designated Provider Care Teams.  These Care Teams include your primary Cardiologist (physician) and Advanced Practice Providers (APPs -  Physician Assistants and Nurse Practitioners) who all work together to provide you with the care you need, when you need it.  We recommend signing up for the patient portal called "MyChart".  Sign up information is provided on this After Visit Summary.  MyChart is used to connect with patients for Virtual Visits (Telemedicine).  Patients are able to view lab/test results, encounter notes, upcoming appointments, etc.  Non-urgent messages can be sent to your provider as well.   To learn more about what you can do with MyChart, go to [email or web address].    Your next appointment:   6 month(s)  Provider:   Reatha Harps, MD

## 2023-08-17 LAB — LAB REPORT - SCANNED
A1c: 5
EGFR: 46

## 2023-10-30 ENCOUNTER — Other Ambulatory Visit: Payer: Self-pay | Admitting: Cardiovascular Disease

## 2023-11-01 ENCOUNTER — Other Ambulatory Visit: Payer: Self-pay | Admitting: Cardiovascular Disease

## 2024-01-27 NOTE — Progress Notes (Unsigned)
 Cardiology Office Note:  .   Date:  01/29/2024  ID:  Stephanie Daniel, DOB December 08, 1950, MRN [id number] PCP: [facility]  [facility] Providers Cardiologist:  Reatha Harps, MD Electrophysiologist:  Lanier Prude, MD { History of Present Illness: .    Chief Complaint  Patient presents with   Follow-up        Stephanie Daniel is a 73 y.o. female with history of CAD, SVT, HTN, HLD who presents for follow-up.   History of Present Illness   Stephanie Daniel is a 73 year old female with CAD, SVT, hypertension, and hyperlipidemia who presents for follow-up after SVT ablation.  She experiences infrequent episodes of heart palpitations, approximately once or twice a month, each lasting about ten minutes. These episodes are described as uncomfortable but less severe than before the ablation. No specific triggers have been identified, and her heart rate during these episodes does not exceed 100 beats per minute. She does not take additional medication during these episodes.  She has a history of coronary artery disease with an elevated coronary calcium score of 2165, but a CTFFR was negative. No chest pain or trouble breathing. Her most recent LDL cholesterol level was 65 mg/dL, and she continues to take Lipitor 40 mg daily and aspirin 81 mg daily.  Her hypertension is managed with lisinopril 20 mg daily, and her blood pressure was recorded as 133/88 mmHg. No symptoms of angina or chest pain.  She remains active and exercises as much as she can. She plans to have her yearly labs done with her primary care physician at the end of March.          Problem List CAD -Calcium score 2165 (99th percentile) -RCA 25-49% FFR CT 0.82 -mid LAD 70-99% FFR CT 0.87 -RI 50-69%  -LCX 25-49% FFR CT 0.79 2. HLD -T chol 201, HDL 115, LDL 65, TG 134 3. HTN -white coat  4. SVT -typical AVNRT ablation 08/21/2022    ROS: All other ROS reviewed and negative. Pertinent  positives noted in the HPI.     Studies Reviewed: Marland Kitchen   EKG Interpretation Date/Time:  Friday January 29 2024 13:00:46 EDT Ventricular Rate:  75 PR Interval:  144 QRS Duration:  88 QT Interval:  374 QTC Calculation: 417 R Axis:   68  Text Interpretation: Normal sinus rhythm Normal ECG Confirmed by Lennie Odor [id number]) on 01/29/2024 1:05:02 PM    TTE 07/18/2022  1. Left ventricular ejection fraction, by estimation, is 65 to 70%. The  left ventricle has normal function. The left ventricle has no regional  wall motion abnormalities. Left ventricular diastolic parameters are  consistent with Grade I diastolic  dysfunction (impaired relaxation).   2. Right ventricular systolic function is normal. The right ventricular  size is normal.   3. Left atrial size was mildly dilated.   4. The mitral valve is normal in structure. Trivial mitral valve  regurgitation. No evidence of mitral stenosis.   5. The aortic valve is normal in structure. There is mild calcification  of the aortic valve. Aortic valve regurgitation is trivial. No aortic  stenosis is present.   6. The inferior vena cava is normal in size with greater than 50%  respiratory variability, suggesting right atrial pressure of 3 mmHg.   7. There was prominent anterior fat pad.   NM Stress 05/23/2022   Findings are consistent with no prior ischemia. The study is low risk.   No ST  deviation was noted.   Left ventricular function is normal. Nuclear stress EF: 78 %. The left ventricular ejection fraction is hyperdynamic (>65%). End diastolic cavity size is normal. End systolic cavity size is normal. Physical Exam:   VS:  BP 132/88   Pulse 75   Ht 5\' 4"  (1.626 m)   Wt 161 lb (73 kg)   SpO2 95%   BMI 27.64 kg/m    Wt Readings from Last 3 Encounters:  01/29/24 161 lb (73 kg)  07/28/23 155 lb 3.2 oz (70.4 kg)  12/25/22 152 lb 3.2 oz (69 kg)    GEN: Well nourished, well developed in no acute distress NECK: No JVD; No carotid  bruits CARDIAC: RRR, no murmurs, rubs, gallops RESPIRATORY:  Clear to auscultation without rales, wheezing or rhonchi  ABDOMEN: Soft, non-tender, non-distended EXTREMITIES:  No edema; No deformity  ASSESSMENT AND PLAN: .   Assessment and Plan    Supraventricular Tachycardia (SVT) Status post SVT ablation with infrequent, manageable palpitations. EKG normal. - Monitor palpitations. - Consider metoprolol if heart rate >100 bpm during episodes. - Contact Dr. Lalla Brothers if episodes increase.  Coronary Artery Disease (CAD) Elevated coronary calcium score, no significant blockages on CTFFR. LDL controlled, active lifestyle. - Continue aspirin 81 mg daily. - Continue Lipitor 40 mg daily. - Send updated cholesterol results from primary care.  Hypertension Blood pressure 133/88 mmHg, well-controlled. - Continue lisinopril 20 mg daily.  Hyperlipidemia LDL cholesterol controlled with current regimen. - Continue Lipitor 40 mg daily. - Send updated cholesterol results from primary care.              Follow-up: Return in about 1 year (around 01/28/2025).  Signed, Lenna Gilford. Flora Lipps, MD, [facility]  [facility]  [address] [place], [place] [address] [phone]  1:15 PM

## 2024-01-29 ENCOUNTER — Ambulatory Visit: Payer: Medicare Other | Attending: Cardiovascular Disease | Admitting: Cardiovascular Disease

## 2024-01-29 ENCOUNTER — Encounter: Payer: Self-pay | Admitting: Cardiovascular Disease

## 2024-01-29 VITALS — BP 132/88 | HR 75 | Ht 64.0 in | Wt 161.0 lb

## 2024-01-29 DIAGNOSIS — I471 Supraventricular tachycardia, unspecified: Secondary | ICD-10-CM | POA: Insufficient documentation

## 2024-01-29 DIAGNOSIS — I2510 Atherosclerotic heart disease of native coronary artery without angina pectoris: Secondary | ICD-10-CM | POA: Diagnosis not present

## 2024-01-29 DIAGNOSIS — I10 Essential (primary) hypertension: Secondary | ICD-10-CM | POA: Insufficient documentation

## 2024-01-29 DIAGNOSIS — E785 Hyperlipidemia, unspecified: Secondary | ICD-10-CM | POA: Insufficient documentation

## 2024-01-29 NOTE — Patient Instructions (Signed)

## 2024-02-15 ENCOUNTER — Encounter: Payer: Self-pay | Admitting: Family Medicine

## 2024-02-15 LAB — LAB REPORT - SCANNED
A1c: 5.2
Creatinine, POC: 55 mg/dL
EGFR: 56
Microalb Creat Ratio: 56.2
Microalbumin, Urine: 3.08

## 2024-03-29 NOTE — Progress Notes (Signed)
 Assessment:  Encounter Diagnosis  Name Primary?  . Recurrent UTI Yes     Plan: 1.  Negative recurrent UTI workup 2.  Start suppressive Keflex 3.  Start vaginal coconut oil 4.  Discontinue vaginal estrogen cream 5.  Follow-up in 3 months with [facility]   Referring Physician:  No referring provider defined for this encounter.  PCP:  Cheryle Frees, MD  Chief Complaint  Patient presents with  . Recurrent UTI    HPI:  73 year old female with history of recurrent UTI presents for follow-up.  Eight UTIs in the last 6 months.  All diagnosed at urgent care.  No UTIs prior.  No cultures available.  No history of breast/endometrial cancer.  Now on vaginal estrogen cream but unable to tolerate due to pain/itching.  Has tried for 2 weeks.  Renal ultrasound without hydronephrosis/stones.  Cystoscopy nomal.  Pelvic exam with vaginal atrophy.  PVR is 0.   LAB RESULTS REVIEWED: Most recent serum creatinine level: Lab Results  Component Value Date/Time   CREATININE 1.17 05/15/2023 02:29 PM    PSA levels: No results found for: PSA  Serum testosterone levels: No results found for: TESTOSTERONE   PMH:  Past Medical History:  Diagnosis Date  . HTN (hypertension)   . SVT (supraventricular tachycardia) (CMD)      PSH: Past Surgical History:  Procedure Laterality Date  . ABDOMINAL HYSTERECTOMY    . APPENDECTOMY    . NASAL POLYP EXCISION Right 05/25/2023   EXCISION NASAL POLYP performed by Norleen JONETTA Sample, MD at [facility] OR  . REPLACEMENT TOTAL KNEE    . SINUS ENDOSCOPY Bilateral 05/25/2023   ENDOSCOPY FUNCTIONAL SINUS SURGERY ETHMOID, MAXILLARY, SPHENOID performed by Norleen JONETTA Sample, MD at [facility] OR  . SINUS SURGERY         Medications: Current Outpatient Medications  Medication Sig Dispense Refill  . acyclovir  (ZOVIRAX ) 400 mg tablet Take 400 mg by mouth 2 (two) times a day.    . aspirin  81 mg EC tablet Take 81 mg by mouth daily.    . atorvastatin  (LIPITOR) 40 mg tablet Take 40 mg  by mouth at bedtime.    . budesonide (PULMICORT) 0.5 mg/2 mL nebulizer solution 2 Respules into 240mL nasal saline irrigation once daily. 120 mL 11  . buPROPion  (WELLBUTRIN  XL) 150 mg 24 hr tablet Take 150 mg by mouth daily.    SABRA conjugated estrogens (Premarin) 0.625 mg/gram vaginal cream Apply pea-sized amount intravaginally 3 times per week at night 30 g 3  . estradioL (ESTRACE) 0.01 % (0.1 mg/gram) vaginal cream Apply pea-sized amount intravaginally 3 times per week at night 42.5 g 3  . fluticasone propionate (FLONASE) 50 mcg/spray nasal spray 2 sprays into each nostril once daily or 1 spray into each nostril twice daily. 16 g 11  . gabapentin  (NEURONTIN ) 300 mg capsule Take 300 mg by mouth at bedtime.    . HYDROcodone -acetaminophen  (NORCO) 5-325 mg per tablet Take 1 tablet by mouth 2 (two) times a day as needed (pain).    . hydrOXYzine (ATARAX) 25 mg tablet Take 25 mg by mouth in the morning and 25 mg at noon and 25 mg in the evening.    SABRA ibuprofen (MOTRIN) 200 mg tablet Take 400 mg by mouth as needed (pain).    . levothyroxine  (SYNTHROID ) 50 mcg tablet Take 50 mcg by mouth every morning.    . lisinopriL  (PRINIVIL ) 20 mg tablet Take 20 mg by mouth daily.    . metoprolol  tartrate (LOPRESSOR ) 25  mg tablet Take 12.5 mg by mouth daily as needed (SVT).    . nitroglycerin  (NITROSTAT ) 0.4 mg SL tablet Place 0.4 mg under the tongue every 5 (five) minutes as needed for chest pain.    SABRA omega 3-dha-epa-fish oil (Fish OiL) 1,000 mg cap capsule Take 2 capsules by mouth daily.    SABRA omeprazole (PriLOSEC) 40 mg DR capsule Take 40 mg by mouth daily.    . potassium citrate 99 mg cap Take 2 capsules by mouth at bedtime.    . cephALEXin (KEFLEX) 500 mg capsule Take 1 capsule (500 mg total) by mouth nightly. 90 capsule 3   No current facility-administered medications for this visit.    Allergies: Albuterol and Oxycodone    Social History: Patient  reports that she has never smoked. She has never used  smokeless tobacco. She reports current alcohol use. She reports that she does not use drugs.   Family History: The patient's family history is not on file.  Review of Systems: A complete review of systems was obtained including: Constitutional, Eyes, ENT, Cardiovascular, Respiratory, GI, GU, Musculoskeletal, Skin, Neurological, Psychiatric, Endocrine, Heme/Lymphatic, and Allergic/Immunologic systems. It is negative or non-contributory to the patient's management except for as stated in this note.   Physical Exam: BP (!) 158/92 (BP Location: Right arm, Patient Position: Sitting)   Pulse 89   Temp 97.6 F (36.4 C) (Oral)   Resp 12  GENERAL APPEARANCE:  Well appearing, well developed, well nourished, NAD HEENT: Atraumatic, Normocephalic NECK: Supple without lymphadenopathy or masses. LUNGS: Clear to auscultation bilaterally, Normal  respiratory effort HEART: no obvious cyanosis ABDOMEN: Soft, non-tender, No Masses. EXTREMITIES: Moves all extremities well.  Without edema. NEUROLOGIC:  Alert and oriented x 3, CN II-XII grossly intact.  MENTAL STATUS:  Appropriate. BACK:  Non-tender to palpation.  No CVAT SKIN:  Warm, dry and intact.   GENT:  Results:   Urinalysis and laboratory results, if present, reviewed and analyzed. Recent Results (from the past 24 hours)  Urinalysis with Reflex to Microscopic   Collection Time: 04/12/24  2:07 PM  Result Value Ref Range   Color, Urine Yellow Yellow   Clarity, Urine Cloudy (A) Clear   Specific Gravity, Urine 1.020 1.002 - 1.030   pH, Urine 6.0 5.0 - 8.0   Protein, Urine Negative Negative, Trace mg/dL   Glucose, Urine Negative Negative mg/dL   Ketones, Urine Trace (A) Negative mg/dL   Bilirubin, Urine 1+ (A) Negative   Blood, Urine Negative Negative   Nitrite, Urine Negative Negative   Leukocyte Esterase, Urine 2+ (A) Negative   Urobilinogen, Urine 0.2 <2.0 mg/dL  Urinalysis, Microscopic Only   Collection Time: 04/12/24  2:07 PM   Result Value Ref Range   WBC, Urine TNTC (A) <3 /HPF   RBC, Urine None Seen <3 /HPF   Bacteria, Urine 16-29 (A) None Seen /HPF   Squamous Epithelial Cells, Urine 1-5 (A) None Seen /HPF   Hyphenated Yeast, Urine 1-5 (A) None Seen /HPF   Budding Yeast, Urine 1-5 (A) None Seen /HPF     Renal ultrasound  Indications:  nephrolithiasis  Findings:  Bilateral flank kidneys are present with longitudinal measurements of:  RK 10.08  Cm  LK 10.51 Cm Echogenicity of the kidneys is normal Hyperechoic foci consistent with renal calculi are not present. Renal cysts are not present. Renal mass (solid) is not present. Hydronephrosis is not present.  Impression: 1.  Normal renal ultrasound

## 2024-04-12 NOTE — Progress Notes (Signed)
  Diagnostic Cystoscopy  Performed by: Redell Lynwood Napoleon, MD Authorized by: Redell Lynwood Napoleon, MD   Procedure Details    Cystoscope type: flexible   Cystoscopy route: transurethral     Cystoscopy location: native bladder     Irrigation used: saline     Position: dorsal lithotomy  Urethra    Urethra: normal    Vagina    Vagina: abnormal     Vaginal mucosa characteristics: atrophic    Bladder    Bladder: normal    Post-Procedure Details    Outcome: patient tolerated procedure well with no complications     Disposition: discharged home in satisfactory condition

## 2024-05-01 ENCOUNTER — Other Ambulatory Visit: Payer: Self-pay | Admitting: Cardiovascular Disease

## 2024-07-25 ENCOUNTER — Emergency Department (HOSPITAL_COMMUNITY)
Admission: EM | Admit: 2024-07-25 | Discharge: 2024-07-25 | Disposition: A | Discharge status: Home / Self Care | Attending: Emergency Medicine | Admitting: Emergency Medicine

## 2024-07-25 ENCOUNTER — Emergency Department (HOSPITAL_COMMUNITY)

## 2024-07-25 DIAGNOSIS — E860 Dehydration: Secondary | ICD-10-CM | POA: Insufficient documentation

## 2024-07-25 DIAGNOSIS — Z7982 Long term (current) use of aspirin: Secondary | ICD-10-CM | POA: Diagnosis not present

## 2024-07-25 DIAGNOSIS — R1110 Vomiting, unspecified: Secondary | ICD-10-CM | POA: Diagnosis present

## 2024-07-25 LAB — COMPREHENSIVE METABOLIC PANEL WITH GFR
ALT: 20 U/L (ref 0–44)
AST: 26 U/L (ref 15–41)
Albumin: 3.6 g/dL (ref 3.5–5.0)
Alkaline Phosphatase: 73 U/L (ref 38–126)
Anion gap: 15 (ref 5–15)
BUN: 15 mg/dL (ref 8–23)
CO2: 21 mmol/L — ABNORMAL LOW (ref 22–32)
Calcium: 9.7 mg/dL (ref 8.9–10.3)
Chloride: 99 mmol/L (ref 98–111)
Creatinine, Ser: 0.97 mg/dL (ref 0.44–1.00)
GFR, Estimated: >60 mL/min (ref >60)
Glucose, Bld: 87 mg/dL (ref 70–99)
Potassium: 4.3 mmol/L (ref 3.5–5.1)
Sodium: 135 mmol/L (ref 135–145)
Total Bilirubin: 0.5 mg/dL (ref 0.0–1.2)
Total Protein: 6.3 g/dL — ABNORMAL LOW (ref 6.5–8.1)

## 2024-07-25 LAB — URINALYSIS, ROUTINE W REFLEX MICROSCOPIC
Bacteria, UA: NONE SEEN
Bilirubin Urine: NEGATIVE
Glucose, UA: NEGATIVE mg/dL
Hgb urine dipstick: NEGATIVE
Ketones, ur: NEGATIVE mg/dL
Nitrite: NEGATIVE
Protein, ur: NEGATIVE mg/dL
Specific Gravity, Urine: 1.014 (ref 1.005–1.030)
pH: 7 (ref 5.0–8.0)

## 2024-07-25 LAB — CBC WITH DIFFERENTIAL/PLATELET
Abs Immature Granulocytes: 0.02 K/uL (ref 0.00–0.07)
Basophils Absolute: 0 K/uL (ref 0.0–0.1)
Basophils Relative: 1 %
Eosinophils Absolute: 0.3 K/uL (ref 0.0–0.5)
Eosinophils Relative: 6 %
HCT: 41.2 % (ref 36.0–46.0)
Hemoglobin: 13.3 g/dL (ref 12.0–15.0)
Immature Granulocytes: 0 %
Lymphocytes Relative: 14 %
Lymphs Abs: 0.7 K/uL (ref 0.7–4.0)
MCH: 30.6 pg (ref 26.0–34.0)
MCHC: 32.3 g/dL (ref 30.0–36.0)
MCV: 94.9 fL (ref 80.0–100.0)
Monocytes Absolute: 0.8 K/uL (ref 0.1–1.0)
Monocytes Relative: 15 %
Neutro Abs: 3.5 K/uL (ref 1.7–7.7)
Neutrophils Relative %: 64 %
Platelets: 250 K/uL (ref 150–400)
RBC: 4.34 MIL/uL (ref 3.87–5.11)
RDW: 14.1 % (ref 11.5–15.5)
WBC: 5.3 K/uL (ref 4.0–10.5)
nRBC: 0 % (ref 0.0–0.2)

## 2024-07-25 LAB — LIPASE, BLOOD: Lipase: 39 U/L (ref 11–51)

## 2024-07-25 MED ORDER — ONDANSETRON 4 MG PO TBDP: ORAL_TABLET | ORAL | 0 refills | Status: DC

## 2024-07-25 MED ORDER — SODIUM CHLORIDE 0.9 % IV BOLUS
1000.0000 mL | Freq: Once | INTRAVENOUS | Status: AC
  Administered 2024-07-25: 1000 mL via INTRAVENOUS

## 2024-07-25 MED ORDER — ONDANSETRON HCL 4 MG/2ML IJ SOLN
4.0000 mg | Freq: Once | INTRAMUSCULAR | Status: DC
  Filled 2024-07-25: qty 2

## 2024-07-25 NOTE — ED Triage Notes (Signed)
 BIB EMS from home with c/o nausea, vomiting. Starting Wednesday of last week. Denies abd pain. Mainly c/o SOB and lack of appetite.  Hx of recurrent uti's and is treated prophylactic. 600 of LR from EMS. CBG 110.

## 2024-07-25 NOTE — Discharge Instructions (Signed)
Drink plenty of fluids and follow-up with your family doctor if any problems 

## 2024-07-25 NOTE — ED Provider Notes (Signed)
 [place] EMERGENCY DEPARTMENT AT [facility] Provider Note   CSN: [id number] Arrival date & time: 07/25/24  1215     Patient presents with: Shortness of Breath   Stephanie Daniel is a 73 y.o. female.  {Add pertinent medical, surgical, social history, OB history to YEP:67052} Patient states that she has some problems with vertigo and has been vomiting for couple days and feels dehydrated.  Her dizziness has improved   Shortness of Breath      Prior to Admission medications   Medication Sig Start Date End Date Taking? Authorizing Provider  ondansetron  (ZOFRAN -ODT) 4 MG disintegrating tablet 4mg  ODT q4 hours prn nausea/vomit 07/25/24  Yes Nyjai Graff, MD  acyclovir  (ZOVIRAX ) 400 MG tablet Take 400 mg by mouth 2 (two) times daily. 02/23/17   [provider]  aspirin  EC 81 MG tablet Take 81 mg by mouth daily. Swallow whole.    [provider]  atorvastatin  (LIPITOR) 40 MG tablet TAKE 1 TABLET(40 MG) BY MOUTH DAILY 05/03/24   O'Neal, Darryle Ned, MD  buPROPion  (WELLBUTRIN  XL) 150 MG 24 hr tablet Take 150 mg by mouth daily.    [provider]  gabapentin  (NEURONTIN ) 300 MG capsule Take 300 mg by mouth at bedtime.    [provider]  HYDROcodone -acetaminophen  (NORCO/VICODIN) 5-325 MG tablet Take 1 tablet by mouth every 6 (six) hours as needed for moderate pain.    [provider]  levothyroxine  (SYNTHROID , LEVOTHROID) 50 MCG tablet Take 50 mcg by mouth daily before breakfast. 02/17/17   [provider]  lisinopril  (ZESTRIL ) 20 MG tablet TAKE 1 TABLET(20 MG) BY MOUTH DAILY 10/30/23   O'Neal, Darryle Ned, MD  metoprolol  tartrate (LOPRESSOR ) 25 MG tablet Take 0.5 tablets (12.5 mg total) by mouth daily as needed (HR>100). 07/19/22   Milissa Tod PARAS, MD  nitroGLYCERIN  (NITROSTAT ) 0.4 MG SL tablet Place 1 tablet (0.4 mg total) under the tongue every 5 (five) minutes x 3 doses as needed for chest pain. 07/29/21   Zhao, Xika, NP   omeprazole (PRILOSEC) 40 MG capsule Take 40 mg by mouth daily.    [provider]  Potassium 99 MG TABS Take 198 mg by mouth at bedtime.    [provider]    Allergies: Albuterol and Oxycodone     Review of Systems  Respiratory:  Positive for shortness of breath.     Updated Vital Signs BP (!) 151/91 (BP Location: Left Arm)   Pulse 89   Temp 98.4 F (36.9 C) (Oral)   Resp 19   SpO2 95%   Physical Exam  (all labs ordered are listed, but only abnormal results are displayed) Labs Reviewed  COMPREHENSIVE METABOLIC PANEL WITH GFR - Abnormal; Notable for the following components:      Result Value   CO2 21 (*)    Total Protein 6.3 (*)    All other components within normal limits  URINALYSIS, ROUTINE W REFLEX MICROSCOPIC - Abnormal; Notable for the following components:   Leukocytes,Ua TRACE (*)    All other components within normal limits  CBC WITH DIFFERENTIAL/PLATELET  LIPASE, BLOOD    EKG: None  Radiology: [facility] Chest Port 1 View Result Date: 07/25/2024 CLINICAL DATA:  Shortness of breath with nausea and vomiting for 5 days. EXAM: PORTABLE CHEST 1 VIEW COMPARISON:  Radiographs 07/17/2022 and 07/28/2021. Cardiac CT 07/29/2021. FINDINGS: 1518 hours. The heart size and mediastinal contours are stable with aortic atherosclerosis. The lungs appear clear. No pleural effusion or pneumothorax. The bones  appear unremarkable. There are soft tissue calcifications adjacent to the right humeral greater tuberosity. Bilateral breast implants noted. IMPRESSION: No evidence of acute cardiopulmonary process. Aortic atherosclerosis. Electronically Signed   By: Elsie Perone M.D.   On: 07/25/2024 15:35    {Document cardiac monitor, telemetry assessment procedure when appropriate:32947} Procedures   Medications Ordered in the ED  ondansetron  (ZOFRAN ) injection 4 mg (4 mg Intravenous Not Given 07/25/24 1437)  sodium chloride  0.9 % bolus 1,000 mL (1,000 mLs Intravenous New  Bag/Given 07/25/24 1436)      {Click here for ABCD2, HEART and other calculators REFRESH Note before signing:1}                              Medical Decision Making Amount and/or Complexity of Data Reviewed Labs: ordered. Radiology: ordered.  Risk Prescription drug management.   Vomiting and dehydration.  Patient improved with IV fluids and Zofran  will be discharged home  {Document critical care time when appropriate  Document review of labs and clinical decision tools ie CHADS2VASC2, etc  Document your independent review of radiology images and any outside records  Document your discussion with family members, caretakers and with consultants  Document social determinants of health affecting pt's care  Document your decision making why or why not admission, treatments were needed:32947:::1}   Final diagnoses:  Acute vomiting    ED Discharge Orders          Ordered    ondansetron  (ZOFRAN -ODT) 4 MG disintegrating tablet        07/25/24 1610

## 2024-07-26 ENCOUNTER — Telehealth: Payer: Self-pay | Admitting: Cardiovascular Disease

## 2024-07-26 NOTE — Telephone Encounter (Signed)
 Spoke with pt regarding her sob and swelling. Pt stated she has been having increased shortness of breath with activity such as walking across the room. Pt stated the shortness of breath has been going on for about a week and a half. Pt stated her ankles have been swollen for about a month as well. Pt denied shortness of breath while at rest or any chest pain. Pt given ED precautions. Pt was scheduled to see Dr. Barbaraann on 9/10. Pt verbalized understanding. All questions if any were answered.

## 2024-07-26 NOTE — Telephone Encounter (Signed)
 Patient returned RN's call.

## 2024-07-26 NOTE — Telephone Encounter (Signed)
 Pt c/o Shortness Of Breath: STAT if SOB developed within the last 24 hours or pt is noticeably SOB on the phone  1. Are you currently SOB (can you hear that pt is SOB on the phone)? No, but an hour ago   2. How long have you been experiencing SOB? About a week and a half   3. Are you SOB when sitting or when up moving around? Moving around   4. Are you currently experiencing any other symptoms? No, but pt stated she was in hospital yesterday from dehydration due to vomiting so much.

## 2024-07-26 NOTE — Telephone Encounter (Signed)
 Left voice message to call back 9/9

## 2024-07-26 NOTE — Progress Notes (Unsigned)
  Cardiology Office Note:  .   Date:  07/26/2024  ID:  Stephanie Daniel, DOB Jun 15, 1951, MRN [id number] PCP: [facility]  [facility] Providers Cardiologist:  Darryle ONEIDA Decent, MD Electrophysiologist:  OLE ONEIDA HOLTS, MD { Click to update primary MD,subspecialty MD or APP then REFRESH:1}   History of Present Illness: .   No chief complaint on file.   Stephanie Daniel is a 73 y.o. female with history of CAD, SVT, HTN, HLD who presents for follow-up.     *** Problem List CAD -Calcium  score 2165 (99th percentile) -RCA 25-49% FFR CT 0.82 -mid LAD 70-99% FFR CT 0.87 -RI 50-69%  -LCX 25-49% FFR CT 0.79 -negative NM Stress 05/23/2022 2. HLD -T chol 201, HDL 115, LDL 65, TG 134 3. HTN -white coat  4. SVT -typical AVNRT ablation 08/21/2022    ROS: All other ROS reviewed and negative. Pertinent positives noted in the HPI.     Studies Reviewed: SABRA       TTE 07/18/2022  1. Left ventricular ejection fraction, by estimation, is 65 to 70%. The  left ventricle has normal function. The left ventricle has no regional  wall motion abnormalities. Left ventricular diastolic parameters are  consistent with Grade I diastolic  dysfunction (impaired relaxation).   2. Right ventricular systolic function is normal. The right ventricular  size is normal.   3. Left atrial size was mildly dilated.   4. The mitral valve is normal in structure. Trivial mitral valve  regurgitation. No evidence of mitral stenosis.   5. The aortic valve is normal in structure. There is mild calcification  of the aortic valve. Aortic valve regurgitation is trivial. No aortic  stenosis is present.   6. The inferior vena cava is normal in size with greater than 50%  respiratory variability, suggesting right atrial pressure of 3 mmHg.   7. There was prominent anterior fat pad.    Physical Exam:   VS:  There were no vitals taken for this visit.   Wt Readings from Last 3 Encounters:  01/29/24  161 lb (73 kg)  07/28/23 155 lb 3.2 oz (70.4 kg)  12/25/22 152 lb 3.2 oz (69 kg)    GEN: Well nourished, well developed in no acute distress NECK: No JVD; No carotid bruits CARDIAC: ***RRR, no murmurs, rubs, gallops RESPIRATORY:  Clear to auscultation without rales, wheezing or rhonchi  ABDOMEN: Soft, non-tender, non-distended EXTREMITIES:  No edema; No deformity  ASSESSMENT AND PLAN: .   ***    {Are you ordering a CV Procedure (e.g. stress test, cath, DCCV, TEE, etc)?   Press F2        :[id number]}   Follow-up: No follow-ups on file.  Time Spent with Patient: I have spent a total of *** minutes caring for this patient today face to face, ordering and reviewing labs/tests, reviewing prior records/medical history, examining the patient, establishing an assessment and plan, communicating results/findings to the patient/family, and documenting in the medical record.   Signed, Darryle ONEIDA. Decent, MD, [facility]  [facility]  [address] [place], [place] [address] [phone]  6:04 PM

## 2024-07-27 ENCOUNTER — Ambulatory Visit: Attending: Cardiovascular Disease | Admitting: Cardiovascular Disease

## 2024-07-27 ENCOUNTER — Encounter: Payer: Self-pay | Admitting: Cardiovascular Disease

## 2024-07-27 VITALS — BP 128/86 | HR 112 | Ht 63.0 in | Wt 157.9 lb

## 2024-07-27 DIAGNOSIS — R5383 Other fatigue: Secondary | ICD-10-CM | POA: Insufficient documentation

## 2024-07-27 DIAGNOSIS — I2510 Atherosclerotic heart disease of native coronary artery without angina pectoris: Secondary | ICD-10-CM | POA: Diagnosis not present

## 2024-07-27 DIAGNOSIS — I10 Essential (primary) hypertension: Secondary | ICD-10-CM | POA: Insufficient documentation

## 2024-07-27 DIAGNOSIS — I5032 Chronic diastolic (congestive) heart failure: Secondary | ICD-10-CM | POA: Diagnosis present

## 2024-07-27 DIAGNOSIS — E785 Hyperlipidemia, unspecified: Secondary | ICD-10-CM | POA: Insufficient documentation

## 2024-07-27 DIAGNOSIS — R0602 Shortness of breath: Secondary | ICD-10-CM | POA: Insufficient documentation

## 2024-07-27 DIAGNOSIS — I471 Supraventricular tachycardia, unspecified: Secondary | ICD-10-CM | POA: Insufficient documentation

## 2024-07-27 NOTE — Patient Instructions (Signed)
 Medication Instructions:  Your physician recommends that you continue on your current medications as directed. Please refer to the Current Medication list given to you today.  *If you need a refill on your cardiac medications before your next appointment, please call your pharmacy*  Lab Work: BNP and TSH at [facility] (1st Floor)  If you have labs (blood work) drawn today and your tests are completely normal, you will receive your results only by: MyChart Message (if you have MyChart) OR A paper copy in the mail If you have any lab test that is abnormal or we need to change your treatment, we will call you to review the results.  Testing/Procedures: Your physician has requested that you have an echocardiogram. Echocardiography is a painless test that uses sound waves to create images of your heart. It provides your doctor with information about the size and shape of your heart and how well your heart's chambers and valves are working. This procedure takes approximately one hour. There are no restrictions for this procedure. Please do NOT wear cologne, perfume, aftershave, or lotions (deodorant is allowed). Please arrive 15 minutes prior to your appointment time.  Please note: We ask at that you not bring children with you during ultrasound (echo/ vascular) testing. Due to room size and safety concerns, children are not allowed in the ultrasound rooms during exams. Our front office staff cannot provide observation of children in our lobby area while testing is being conducted. An adult accompanying a patient to their appointment will only be allowed in the ultrasound room at the discretion of the ultrasound technician under special circumstances. We apologize for any inconvenience.   Follow-Up: At [facility], you and your health needs are our priority.  As part of our continuing mission to provide you with exceptional heart care, our providers are all part of one team.  This team  includes your primary Cardiologist (physician) and Advanced Practice Providers or APPs (Physician Assistants and Nurse Practitioners) who all work together to provide you with the care you need, when you need it.  Your next appointment:   4 month(s)  Provider:   Darryle ONEIDA Decent, MD

## 2024-07-28 ENCOUNTER — Other Ambulatory Visit: Payer: Self-pay | Admitting: Cardiovascular Disease

## 2024-07-28 LAB — PRO B NATRIURETIC PEPTIDE: NT-Pro BNP: 212 pg/mL (ref 0–738)

## 2024-07-28 LAB — TSH: TSH: 1.01 u[IU]/mL (ref 0.450–4.500)

## 2024-07-29 ENCOUNTER — Ambulatory Visit: Payer: Self-pay | Admitting: Cardiovascular Disease

## 2024-08-16 ENCOUNTER — Other Ambulatory Visit: Payer: Self-pay | Admitting: Family Medicine

## 2024-08-16 DIAGNOSIS — R634 Abnormal weight loss: Secondary | ICD-10-CM

## 2024-08-17 ENCOUNTER — Ambulatory Visit
Admission: RE | Admit: 2024-08-17 | Discharge: 2024-08-17 | Disposition: A | Discharge status: Home / Self Care | Source: Ambulatory Visit | Attending: Family Medicine | Admitting: Family Medicine

## 2024-08-17 DIAGNOSIS — R634 Abnormal weight loss: Secondary | ICD-10-CM

## 2024-08-25 ENCOUNTER — Other Ambulatory Visit: Payer: Self-pay | Admitting: *Deleted

## 2024-08-25 DIAGNOSIS — M79606 Pain in leg, unspecified: Secondary | ICD-10-CM

## 2024-08-25 DIAGNOSIS — R9389 Abnormal findings on diagnostic imaging of other specified body structures: Secondary | ICD-10-CM

## 2024-08-25 DIAGNOSIS — R634 Abnormal weight loss: Secondary | ICD-10-CM

## 2024-08-25 DIAGNOSIS — I771 Stricture of artery: Secondary | ICD-10-CM

## 2024-08-26 ENCOUNTER — Ambulatory Visit (HOSPITAL_COMMUNITY)

## 2024-08-31 ENCOUNTER — Encounter: Admitting: Vascular Surgery

## 2024-09-05 ENCOUNTER — Ambulatory Visit (HOSPITAL_COMMUNITY)
Admission: RE | Admit: 2024-09-05 | Discharge: 2024-09-05 | Disposition: A | Discharge status: Home / Self Care | Source: Ambulatory Visit | Attending: Cardiovascular Disease | Admitting: Cardiovascular Disease

## 2024-09-05 DIAGNOSIS — R0602 Shortness of breath: Secondary | ICD-10-CM | POA: Insufficient documentation

## 2024-09-05 LAB — ECHOCARDIOGRAM COMPLETE
Area-P 1/2: 3.39 cm2
S' Lateral: 2.7 cm

## 2024-09-19 ENCOUNTER — Emergency Department (HOSPITAL_COMMUNITY)

## 2024-09-19 ENCOUNTER — Inpatient Hospital Stay (HOSPITAL_COMMUNITY)
Admission: EM | Admit: 2024-09-19 | Discharge: 2024-09-23 | DRG: 640 | Disposition: A | Discharge status: Home / Self Care | Attending: Internal Medicine | Admitting: Internal Medicine

## 2024-09-19 ENCOUNTER — Other Ambulatory Visit: Payer: Self-pay

## 2024-09-19 DIAGNOSIS — E8809 Other disorders of plasma-protein metabolism, not elsewhere classified: Secondary | ICD-10-CM | POA: Diagnosis present

## 2024-09-19 DIAGNOSIS — E785 Hyperlipidemia, unspecified: Secondary | ICD-10-CM | POA: Diagnosis present

## 2024-09-19 DIAGNOSIS — Z8249 Family history of ischemic heart disease and other diseases of the circulatory system: Secondary | ICD-10-CM

## 2024-09-19 DIAGNOSIS — R600 Localized edema: Secondary | ICD-10-CM | POA: Diagnosis present

## 2024-09-19 DIAGNOSIS — E876 Hypokalemia: Secondary | ICD-10-CM | POA: Diagnosis present

## 2024-09-19 DIAGNOSIS — I2510 Atherosclerotic heart disease of native coronary artery without angina pectoris: Secondary | ICD-10-CM | POA: Diagnosis present

## 2024-09-19 DIAGNOSIS — Z96653 Presence of artificial knee joint, bilateral: Secondary | ICD-10-CM | POA: Diagnosis present

## 2024-09-19 DIAGNOSIS — Z85820 Personal history of malignant melanoma of skin: Secondary | ICD-10-CM

## 2024-09-19 DIAGNOSIS — E875 Hyperkalemia: Secondary | ICD-10-CM | POA: Diagnosis not present

## 2024-09-19 DIAGNOSIS — Z7982 Long term (current) use of aspirin: Secondary | ICD-10-CM

## 2024-09-19 DIAGNOSIS — Z87891 Personal history of nicotine dependence: Secondary | ICD-10-CM | POA: Diagnosis not present

## 2024-09-19 DIAGNOSIS — B002 Herpesviral gingivostomatitis and pharyngotonsillitis: Secondary | ICD-10-CM | POA: Diagnosis present

## 2024-09-19 DIAGNOSIS — Z825 Family history of asthma and other chronic lower respiratory diseases: Secondary | ICD-10-CM | POA: Diagnosis not present

## 2024-09-19 DIAGNOSIS — I5032 Chronic diastolic (congestive) heart failure: Secondary | ICD-10-CM | POA: Diagnosis present

## 2024-09-19 DIAGNOSIS — K219 Gastro-esophageal reflux disease without esophagitis: Secondary | ICD-10-CM | POA: Diagnosis present

## 2024-09-19 DIAGNOSIS — R1110 Vomiting, unspecified: Secondary | ICD-10-CM | POA: Diagnosis present

## 2024-09-19 DIAGNOSIS — Z79899 Other long term (current) drug therapy: Secondary | ICD-10-CM

## 2024-09-19 DIAGNOSIS — Z79890 Hormone replacement therapy (postmenopausal): Secondary | ICD-10-CM

## 2024-09-19 DIAGNOSIS — E039 Hypothyroidism, unspecified: Secondary | ICD-10-CM | POA: Diagnosis present

## 2024-09-19 DIAGNOSIS — H532 Diplopia: Secondary | ICD-10-CM | POA: Diagnosis not present

## 2024-09-19 DIAGNOSIS — E860 Dehydration: Secondary | ICD-10-CM | POA: Diagnosis present

## 2024-09-19 DIAGNOSIS — R251 Tremor, unspecified: Secondary | ICD-10-CM | POA: Diagnosis present

## 2024-09-19 DIAGNOSIS — T464X5A Adverse effect of angiotensin-converting-enzyme inhibitors, initial encounter: Secondary | ICD-10-CM | POA: Diagnosis present

## 2024-09-19 DIAGNOSIS — E43 Unspecified severe protein-calorie malnutrition: Secondary | ICD-10-CM | POA: Diagnosis present

## 2024-09-19 DIAGNOSIS — Z888 Allergy status to other drugs, medicaments and biological substances status: Secondary | ICD-10-CM

## 2024-09-19 DIAGNOSIS — R197 Diarrhea, unspecified: Secondary | ICD-10-CM | POA: Diagnosis present

## 2024-09-19 DIAGNOSIS — Z6829 Body mass index (BMI) 29.0-29.9, adult: Secondary | ICD-10-CM

## 2024-09-19 DIAGNOSIS — I110 Hypertensive heart disease with heart failure: Secondary | ICD-10-CM | POA: Diagnosis present

## 2024-09-19 DIAGNOSIS — R54 Age-related physical debility: Secondary | ICD-10-CM | POA: Diagnosis present

## 2024-09-19 DIAGNOSIS — Z885 Allergy status to narcotic agent status: Secondary | ICD-10-CM

## 2024-09-19 DIAGNOSIS — Z87440 Personal history of urinary (tract) infections: Secondary | ICD-10-CM

## 2024-09-19 LAB — CBG MONITORING, ED
Glucose-Capillary: 127 mg/dL — ABNORMAL HIGH (ref 70–99)
Glucose-Capillary: 67 mg/dL — ABNORMAL LOW (ref 70–99)

## 2024-09-19 LAB — DIFFERENTIAL
Abs Immature Granulocytes: 0.04 K/uL (ref 0.00–0.07)
Basophils Absolute: 0.1 K/uL (ref 0.0–0.1)
Basophils Relative: 1 %
Eosinophils Absolute: 0.1 K/uL (ref 0.0–0.5)
Eosinophils Relative: 2 %
Immature Granulocytes: 1 %
Lymphocytes Relative: 17 %
Lymphs Abs: 1.3 K/uL (ref 0.7–4.0)
Monocytes Absolute: 0.7 K/uL (ref 0.1–1.0)
Monocytes Relative: 9 %
Neutro Abs: 5.6 K/uL (ref 1.7–7.7)
Neutrophils Relative %: 70 %

## 2024-09-19 LAB — CBC
HCT: 36.5 % (ref 36.0–46.0)
Hemoglobin: 12.3 g/dL (ref 12.0–15.0)
MCH: 29.8 pg (ref 26.0–34.0)
MCHC: 33.7 g/dL (ref 30.0–36.0)
MCV: 88.4 fL (ref 80.0–100.0)
Platelets: 339 K/uL (ref 150–400)
RBC: 4.13 MIL/uL (ref 3.87–5.11)
RDW: 15.5 % (ref 11.5–15.5)
WBC: 7.9 K/uL (ref 4.0–10.5)
nRBC: 0 % (ref 0.0–0.2)

## 2024-09-19 LAB — PHOSPHORUS: Phosphorus: 4.9 mg/dL — ABNORMAL HIGH (ref 2.5–4.6)

## 2024-09-19 LAB — TSH: TSH: 2.809 u[IU]/mL (ref 0.350–4.500)

## 2024-09-19 LAB — COMPREHENSIVE METABOLIC PANEL WITH GFR
ALT: 13 U/L (ref 0–44)
AST: 23 U/L (ref 15–41)
Albumin: 2.2 g/dL — ABNORMAL LOW (ref 3.5–5.0)
Alkaline Phosphatase: 58 U/L (ref 38–126)
Anion gap: 15 (ref 5–15)
BUN: 5 mg/dL — ABNORMAL LOW (ref 8–23)
CO2: 21 mmol/L — ABNORMAL LOW (ref 22–32)
Calcium: 5.6 mg/dL — CL (ref 8.9–10.3)
Chloride: 107 mmol/L (ref 98–111)
Creatinine, Ser: 0.94 mg/dL (ref 0.44–1.00)
GFR, Estimated: >60 mL/min (ref >60)
Glucose, Bld: 70 mg/dL (ref 70–99)
Potassium: 3.1 mmol/L — ABNORMAL LOW (ref 3.5–5.1)
Sodium: 143 mmol/L (ref 135–145)
Total Bilirubin: 1 mg/dL (ref 0.0–1.2)
Total Protein: 4.8 g/dL — ABNORMAL LOW (ref 6.5–8.1)

## 2024-09-19 LAB — D-DIMER, QUANTITATIVE: D-Dimer, Quant: 1.69 ug{FEU}/mL — ABNORMAL HIGH (ref 0.00–0.50)

## 2024-09-19 LAB — PROTIME-INR
INR: 1.3 — ABNORMAL HIGH (ref 0.8–1.2)
Prothrombin Time: 16.8 s — ABNORMAL HIGH (ref 11.4–15.2)

## 2024-09-19 LAB — T4, FREE: Free T4: 1.06 ng/dL (ref 0.61–1.12)

## 2024-09-19 LAB — ETHANOL: Alcohol, Ethyl (B): <15 mg/dL (ref <15)

## 2024-09-19 LAB — APTT: aPTT: 30 s (ref 24–36)

## 2024-09-19 LAB — MAGNESIUM: Magnesium: <0.5 mg/dL — CL (ref 1.7–2.4)

## 2024-09-19 MED ORDER — SODIUM CHLORIDE 0.9 % IV SOLN: INTRAVENOUS | Status: AC

## 2024-09-19 MED ORDER — HEPARIN SODIUM (PORCINE) 5000 UNIT/ML IJ SOLN
5000.0000 [IU] | Freq: Three times a day (TID) | INTRAMUSCULAR | Status: DC
  Administered 2024-09-19 – 2024-09-23 (×10): 5000 [IU] via SUBCUTANEOUS
  Filled 2024-09-19 (×10): qty 1

## 2024-09-19 MED ORDER — METOPROLOL TARTRATE 12.5 MG HALF TABLET: 12.5000 mg | ORAL_TABLET | Freq: Every day | ORAL | Status: DC | PRN

## 2024-09-19 MED ORDER — LACTATED RINGERS IV SOLN: INTRAVENOUS | Status: DC

## 2024-09-19 MED ORDER — ATORVASTATIN CALCIUM 40 MG PO TABS
40.0000 mg | ORAL_TABLET | Freq: Every day | ORAL | Status: DC
  Administered 2024-09-20 – 2024-09-23 (×4): 40 mg via ORAL
  Filled 2024-09-19 (×4): qty 1

## 2024-09-19 MED ORDER — ACETAMINOPHEN 650 MG RE SUPP: 650.0000 mg | Freq: Four times a day (QID) | RECTAL | Status: DC | PRN

## 2024-09-19 MED ORDER — LISINOPRIL 5 MG PO TABS
10.0000 mg | ORAL_TABLET | Freq: Every day | ORAL | Status: DC
  Administered 2024-09-20 – 2024-09-21 (×2): 10 mg via ORAL
  Filled 2024-09-19 (×2): qty 2
  Filled 2024-09-19: qty 1

## 2024-09-19 MED ORDER — ACETAMINOPHEN 325 MG PO TABS: 650.0000 mg | ORAL_TABLET | Freq: Four times a day (QID) | ORAL | Status: DC | PRN

## 2024-09-19 MED ORDER — METOCLOPRAMIDE HCL 5 MG/ML IJ SOLN: 5.0000 mg | Freq: Three times a day (TID) | INTRAMUSCULAR | Status: DC | PRN

## 2024-09-19 MED ORDER — ASPIRIN 81 MG PO TBEC
81.0000 mg | DELAYED_RELEASE_TABLET | Freq: Every day | ORAL | Status: DC
  Administered 2024-09-20 – 2024-09-23 (×4): 81 mg via ORAL
  Filled 2024-09-19 (×4): qty 1

## 2024-09-19 MED ORDER — MAGNESIUM SULFATE 4 GM/100ML IV SOLN
4.0000 g | Freq: Once | INTRAVENOUS | Status: AC
Start: 2024-09-19 — End: 2024-09-19
  Administered 2024-09-19: 4 g via INTRAVENOUS
  Filled 2024-09-19: qty 100

## 2024-09-19 MED ORDER — PANTOPRAZOLE SODIUM 40 MG IV SOLR
40.0000 mg | Freq: Two times a day (BID) | INTRAVENOUS | Status: DC
  Administered 2024-09-19: 40 mg via INTRAVENOUS
  Filled 2024-09-19: qty 10

## 2024-09-19 MED ORDER — SODIUM CHLORIDE 0.9% FLUSH
3.0000 mL | Freq: Two times a day (BID) | INTRAVENOUS | Status: DC
  Administered 2024-09-20 – 2024-09-23 (×7): 3 mL via INTRAVENOUS

## 2024-09-19 MED ORDER — BUPROPION HCL ER (XL) 150 MG PO TB24
150.0000 mg | ORAL_TABLET | Freq: Every day | ORAL | Status: DC
  Administered 2024-09-20 – 2024-09-23 (×4): 150 mg via ORAL
  Filled 2024-09-19 (×4): qty 1

## 2024-09-19 MED ORDER — CALCIUM GLUCONATE-NACL 1-0.675 GM/50ML-% IV SOLN
1.0000 g | Freq: Once | INTRAVENOUS | Status: AC
Start: 2024-09-19 — End: 2024-09-19
  Administered 2024-09-19: 1000 mg via INTRAVENOUS
  Filled 2024-09-19: qty 50

## 2024-09-19 MED ORDER — DEXTROSE 50 % IV SOLN
25.0000 mL | Freq: Once | INTRAVENOUS | Status: AC
  Administered 2024-09-19: 25 mL via INTRAVENOUS
  Filled 2024-09-19: qty 50

## 2024-09-19 MED ORDER — LEVOTHYROXINE SODIUM 50 MCG PO TABS
50.0000 ug | ORAL_TABLET | Freq: Every day | ORAL | Status: DC
  Administered 2024-09-20 – 2024-09-23 (×4): 50 ug via ORAL
  Filled 2024-09-19 (×4): qty 1

## 2024-09-19 NOTE — Hospital Course (Addendum)
 Stephanie Daniel

## 2024-09-19 NOTE — ED Provider Notes (Signed)
 [facility] AT [facility] Provider Note   CSN: [id number] Arrival date & time: 09/19/24  1059     Patient presents with: Emesis   Stephanie Daniel is a 73 y.o. female.   Patient is a 73 year old female with a history of hypertension, coronary artery disease, GERD who presents with shaking from the inside.  She does have a history of a tremor but she said this has been worse over the last 2 to 3 days.  She feels like she is shaking from the inside out.  She also had an episode of vomiting in the EMS unit on the way to the hospital.  She has not had any vomiting since that time.  No known fevers.  She has had a little bit of a cough.  No congestion.  No chest pain or shortness of breath.  She says that she has had double vision for the last 2 days.  She denies any difficulty getting her words out.  She has numbness intermittently in both of her hands but she denies any currently.  No weakness in her arms or legs.  No abdominal pain.       Prior to Admission medications   Medication Sig Start Date End Date Taking? Authorizing Provider  acyclovir  (ZOVIRAX ) 400 MG tablet Take 400 mg by mouth 2 (two) times daily. 02/23/17   [provider]  aspirin  EC 81 MG tablet Take 81 mg by mouth daily. Swallow whole.    [provider]  atorvastatin  (LIPITOR) 40 MG tablet TAKE 1 TABLET(40 MG) BY MOUTH DAILY 05/03/24   O'Neal, Darryle Ned, MD  buPROPion  (WELLBUTRIN  XL) 150 MG 24 hr tablet Take 150 mg by mouth daily.    [provider]  gabapentin  (NEURONTIN ) 300 MG capsule Take 300 mg by mouth at bedtime.    [provider]  HYDROcodone -acetaminophen  (NORCO/VICODIN) 5-325 MG tablet Take 1 tablet by mouth every 6 (six) hours as needed for moderate pain.    [provider]  levothyroxine  (SYNTHROID , LEVOTHROID) 50 MCG tablet Take 50 mcg by mouth daily before breakfast. 02/17/17   [provider]  lisinopril  (ZESTRIL ) 20 MG  tablet TAKE 1 TABLET(20 MG) BY MOUTH DAILY 07/28/24   O'Neal, Darryle Ned, MD  metoprolol  tartrate (LOPRESSOR ) 25 MG tablet Take 0.5 tablets (12.5 mg total) by mouth daily as needed (HR>100). 07/19/22   Milissa Tod PARAS, MD  nitroGLYCERIN  (NITROSTAT ) 0.4 MG SL tablet Place 1 tablet (0.4 mg total) under the tongue every 5 (five) minutes x 3 doses as needed for chest pain. 07/29/21   Zhao, Xika, NP  omeprazole (PRILOSEC) 40 MG capsule Take 40 mg by mouth daily.    [provider]  ondansetron  (ZOFRAN -ODT) 4 MG disintegrating tablet 4mg  ODT q4 hours prn nausea/vomit 07/25/24   Zammit, Joseph, MD  Potassium 99 MG TABS Take 198 mg by mouth at bedtime.    [provider]    Allergies: Albuterol and Oxycodone     Review of Systems  Constitutional:  Positive for fatigue. Negative for chills, diaphoresis and fever.  HENT:  Negative for congestion, rhinorrhea and sneezing.   Eyes:  Positive for visual disturbance.  Respiratory:  Positive for cough. Negative for chest tightness and shortness of breath.   Cardiovascular:  Negative for chest pain and leg swelling.  Gastrointestinal:  Positive for nausea and vomiting (X 1). Negative for abdominal pain, blood in stool and diarrhea.  Genitourinary:  Negative for difficulty urinating, flank pain and  frequency.  Musculoskeletal:  Negative for arthralgias and back pain.  Skin:  Negative for rash.  Neurological:  Positive for numbness. Negative for dizziness, speech difficulty, weakness and headaches.    Updated Vital Signs BP (!) 141/90   Pulse 88   Temp 97.8 F (36.6 C) (Oral)   Resp 19   SpO2 96%   Physical Exam Constitutional:      Appearance: She is well-developed.  HENT:     Head: Normocephalic and atraumatic.  Eyes:     Extraocular Movements: Extraocular movements intact.     Pupils: Pupils are equal, round, and reactive to light.     Comments: Double vision when both eyes are open, she states vision goes back to normal when  either eye is closed.  Visual fields full to confrontation.  Cardiovascular:     Rate and Rhythm: Normal rate and regular rhythm.     Heart sounds: Normal heart sounds.  Pulmonary:     Effort: Pulmonary effort is normal. No respiratory distress.     Breath sounds: Normal breath sounds. No wheezing or rales.  Chest:     Chest wall: No tenderness.  Abdominal:     General: Bowel sounds are normal.     Palpations: Abdomen is soft.     Tenderness: There is no abdominal tenderness. There is no guarding or rebound.  Musculoskeletal:        General: Normal range of motion.     Cervical back: Normal range of motion and neck supple.  Lymphadenopathy:     Cervical: No cervical adenopathy.  Skin:    General: Skin is warm and dry.     Findings: No rash.  Neurological:     Mental Status: She is alert and oriented to person, place, and time.     Comments: Positive facial drooping in the left when she smiles.  Normal sensation to light touch in the face.  Motor 5 out of 5 to the upper extremities bilaterally, 3 out of 5 to the lower extremities bilaterally, sensation grossly intact to light touch in all extremities.  No pronator drift     (all labs ordered are listed, but only abnormal results are displayed) Labs Reviewed  PROTIME-INR - Abnormal; Notable for the following components:      Result Value   Prothrombin Time 16.8 (*)    INR 1.3 (*)    All other components within normal limits  COMPREHENSIVE METABOLIC PANEL WITH GFR - Abnormal; Notable for the following components:   Potassium 3.1 (*)    CO2 21 (*)    BUN 5 (*)    Calcium  5.6 (*)    Total Protein 4.8 (*)    Albumin 2.2 (*)    All other components within normal limits  CBG MONITORING, ED - Abnormal; Notable for the following components:   Glucose-Capillary 67 (*)    All other components within normal limits  CBG MONITORING, ED - Abnormal; Notable for the following components:   Glucose-Capillary 127 (*)    All other  components within normal limits  APTT  CBC  DIFFERENTIAL  ETHANOL  RAPID URINE DRUG SCREEN, HOSP PERFORMED  URINALYSIS, W/ REFLEX TO CULTURE (INFECTION SUSPECTED)  MAGNESIUM   PHOSPHORUS    EKG: EKG Interpretation Date/Time:  Monday September 19 2024 11:37:39 EST Ventricular Rate:  83 PR Interval:  154 QRS Duration:  100 QT Interval:  439 QTC Calculation: 516 R Axis:   -13  Text Interpretation: Sinus rhythm Ventricular premature complex Low voltage,  precordial leads Nonspecific T abnormalities, diffuse leads Prolonged QT interval since last tracing no significant change Confirmed by Lenor Hollering [phone]) on 09/19/2024 11:43:52 AM  Radiology: MR BRAIN WO CONTRAST Result Date: 09/19/2024 EXAM: MRI BRAIN WITHOUT CONTRAST 09/19/2024 01:50:00 PM TECHNIQUE: Multiplanar multisequence MRI of the head/brain was performed without the administration of intravenous contrast. COMPARISON: CT head from today. CLINICAL HISTORY: Neuro deficit, acute, stroke suspected FINDINGS: BRAIN AND VENTRICLES: No acute infarct. Small remote left parietal cortical infarct. Mild for age T2/FLAIR hyperintensity in the white matter, compatible with coronary microvascular ischemic changes. No intracranial hemorrhage. No mass. No midline shift. No hydrocephalus. Normal flow voids. ORBITS: No acute abnormality. SINUSES AND MASTOIDS: Clear sinuses. Trace right mastoid effusion. BONES AND SOFT TISSUES: Normal marrow signal. No acute soft tissue abnormality. IMPRESSION: 1. No acute intracranial abnormality. Electronically signed by: Gilmore Molt MD 09/19/2024 03:00 PM EST RP Workstation: [id number]   DG Chest Port 1 View Result Date: 09/19/2024 EXAM: 1 VIEW(S) XRAY OF THE CHEST 09/19/2024 11:54:00 AM COMPARISON: 07/25/2024 CLINICAL HISTORY: SOB FINDINGS: LUNGS AND PLEURA: No focal pulmonary opacity. No pulmonary edema. Small right pleural effusion, new since prior. No pneumothorax. HEART AND MEDIASTINUM: Cardiomegaly.  Calcified aorta. BONES AND SOFT TISSUES: No acute osseous abnormality. IMPRESSION: 1. Small right pleural effusion, new since prior. 2. Cardiomegaly and calcified aorta. Electronically signed by: Evalene Coho MD 09/19/2024 12:19 PM EST RP Workstation: [id number]   CT HEAD WO CONTRAST Result Date: 09/19/2024 EXAM: CT HEAD WITHOUT CONTRAST 09/19/2024 12:04:00 PM TECHNIQUE: CT of the head was performed without the administration of intravenous contrast. Automated exposure control, iterative reconstruction, and/or weight based adjustment of the mA/kV was utilized to reduce the radiation dose to as low as reasonably achievable. COMPARISON: Maxillofacial CT dated 12/31/2022. CLINICAL HISTORY: Neuro deficit, acute, stroke suspected. FINDINGS: BRAIN AND VENTRICLES: No acute hemorrhage. No evidence of acute infarct. No hydrocephalus. No extra-axial collection. No mass effect or midline shift. Atherosclerotic calcifications within the cavernous internal carotid arteries. ORBITS: Bilateral lens replacement. SINUSES: Status post facial reconstruction surgery. Interval near complete resolution of left maxillary sinus disease. SOFT TISSUES AND SKULL: No acute soft tissue abnormality. No skull fracture. IMPRESSION: 1. No acute intracranial abnormality. Electronically signed by: Evalene Coho MD 09/19/2024 12:19 PM EST RP Workstation: [id number]     Procedures   Medications Ordered in the ED  dextrose  50 % solution 25 mL (25 mLs Intravenous Given 09/19/24 1146)  calcium  gluconate 1 g/ 50 mL sodium chloride  IVPB (1,000 mg Intravenous New Bag/Given 09/19/24 1413)                                    Medical Decision Making Amount and/or Complexity of Data Reviewed Labs: ordered. Radiology: ordered.  Risk Prescription drug management. Decision regarding hospitalization.   This patient presents to the ED for concern of tremors, double vision, facial drooping, this involves an extensive number of treatment  options, and is a complaint that carries with it a high risk of complications and morbidity.  I considered the following differential and admission for this acute, potentially life threatening condition.  The differential diagnosis includes stroke, intracranial hemorrhage, infection/sepsis, electrolyte abnormality, seizure activity  MDM:    Patient is a 73 year old who presents with increasing tremors and feeling like she is shaking.  She is awake and alert.  She does not have any focal neurologic deficits although she does have some left-sided facial drooping and reports  double vision which is binocular.  Her head CT does not show any acute abnormality.  No intracranial hemorrhage.  Chest x-ray shows a small right pleural effusion but otherwise nonconcerning.  She initially had a low glucose and was given half an amp of D50.  Her glucose improved following this.  CT of her head does not show any acute abnormality.  MRI does not show any acute stroke.  Her labs show a markedly low calcium .  She was started on calcium  replacement.  Added on a magnesium  and phosphorus level.  I actually suspect that this hypocalcemia is causing her symptoms of spasms and possibly the double vision.  She was given 1 g of calcium  gluconate.  Discussed with Dr. Tobie who will admit the patient for further treatment.  CRITICAL CARE Performed by: Andrea Ness Total critical care time: 70 minutes Critical care time was exclusive of separately billable procedures and treating other patients. Critical care was necessary to treat or prevent imminent or life-threatening deterioration. Critical care was time spent personally by me on the following activities: development of treatment plan with patient and/or surrogate as well as nursing, discussions with consultants, evaluation of patient's response to treatment, examination of patient, obtaining history from patient or surrogate, ordering and performing treatments and interventions,  ordering and review of laboratory studies, ordering and review of radiographic studies, pulse oximetry and re-evaluation of patient's condition.   (Labs, imaging, consults)  Labs: I Ordered, and personally interpreted labs.  The pertinent results include: Markedly low calcium   Imaging Studies ordered: I ordered imaging studies including chest x-ray, CT head, MRI brain I independently visualized and interpreted imaging. I agree with the radiologist interpretation  Additional history obtained from friend  at bedside.  External records from outside source obtained and reviewed including history  Cardiac Monitoring: The patient was maintained on a cardiac monitor.  If on the cardiac monitor, I personally viewed and interpreted the cardiac monitored which showed an underlying rhythm of: Sinus rhythm, slightly prolonged QT interval  Reevaluation: After the interventions noted above, I reevaluated the patient and found that they have :improved  Social Determinants of Health:    Disposition: Admit to hospital  Co morbidities that complicate the patient evaluation  Past Medical History:  Diagnosis Date   Arthritis    Coronary artery disease    GERD (gastroesophageal reflux disease)    History of kidney stones    Hypertension    Hypothyroidism      Medicines Meds ordered this encounter  Medications   dextrose  50 % solution 25 mL   calcium  gluconate 1 g/ 50 mL sodium chloride  IVPB    I have reviewed the patients home medicines and have made adjustments as needed  Problem List / ED Course: Problem List Items Addressed This Visit   None Visit Diagnoses       Hypocalcemia    -  Primary     Double vision                    Final diagnoses:  Hypocalcemia  Double vision    ED Discharge Orders     None          Ness Andrea, MD 09/19/24 1521

## 2024-09-19 NOTE — H&P (Signed)
 History and Physical    Patient: Stephanie Daniel FMW:[id number] DOB: [birth year] DOA: 09/19/2024 DOS: the patient was seen and examined on 09/19/2024 . PCP: [facility]  Patient coming from: Home Chief complaint: Chief Complaint  Patient presents with   Emesis   HPI:  Stephanie Daniel is a 73 y.o. female with past medical history  of  CAD, SVT, hypertension, and hyperlipidemia coming in with spasm, Double vision , and had CVA workup which was negative with an MRI of the brain noncontrast.  Sister at bedside is concerned about the exact etiology and her recent visit to the ER.  Patient does not take any osteoporosis medications, has no history of cancer except for the melanoma which was removed many years ago.  ED Course:  Vital signs in the ED were notable for the following:  Vitals:   09/19/24 1245 09/19/24 1445 09/19/24 1506 09/19/24 1800  BP: 133/79 (!) 141/90  (!) 141/117  Pulse: 90 88  95  Temp:   97.8 F (36.6 C)   Resp: (!) 22 19  (!) 23  SpO2: 97% 96%  91%  TempSrc:   Oral    >>ED evaluation thus far shows: Initial CMP shows potassium 3.1 bicarb 21 calcium  5.6 albumin 2.2 anion gap of 15 normal LFTs. Normal CBC.  With initial EKG did show prolonged QT of 516 otherwise sinus rhythm at 83 PR 154 QRS of 100 nonspecific T wave abnormalities diffusely in all leads.  Along with artifacts. Repeat EKG shows improvement of QTc to 494 heart rate of 102 PR 165 QRS 128, IVCD,  right bundle branch block.   >>While in the ED patient received the following: Medications  dextrose  50 % solution 25 mL (25 mLs Intravenous Given 09/19/24 1146)  calcium  gluconate 1 g/ 50 mL sodium chloride  IVPB (1,000 mg Intravenous New Bag/Given 09/19/24 1413)   Review of Systems  Eyes:  Positive for double vision.  Neurological:  Positive for tremors and weakness.   Past Medical History:  Diagnosis Date   Arthritis    Coronary artery disease    GERD (gastroesophageal reflux  disease)    History of kidney stones    Hypertension    Hypothyroidism    Past Surgical History:  Procedure Laterality Date   ABDOMINAL HYSTERECTOMY     APPENDECTOMY     KNEE SURGERY  11/18/2011   SVT ABLATION N/A 08/21/2022   Procedure: SVT ABLATION;  Surgeon: Cindie Ole DASEN, MD;  Location: [facility];  Service: Cardiovascular;  Laterality: N/A;   TONSILLECTOMY     TOTAL KNEE ARTHROPLASTY Left 05/04/2017   Procedure: LEFT TOTAL KNEE ARTHROPLASTY;  Surgeon: Ernie Cough, MD;  Location: [facility];  Service: Orthopedics;  Laterality: Left;  90 mins   TOTAL KNEE ARTHROPLASTY Right 07/11/2022   Procedure: TOTAL KNEE ARTHROPLASTY;  Surgeon: Kay Kemps, MD;  Location: [facility];  Service: Orthopedics;  Laterality: Right;    reports that she quit smoking about 35 years ago. Her smoking use included cigarettes. She started smoking about 55 years ago. She has never used smokeless tobacco. She reports current alcohol use. She reports that she does not use drugs. Allergies  Allergen Reactions   Albuterol Other (See Comments)    Respiratory distress   Oxycodone  Nausea And Vomiting   Family History  Problem Relation Age of Onset   COPD Mother    Heart disease Mother    Heart disease Father    Cancer Father  COPD Brother    Hypertension Son    Prior to Admission medications   Medication Sig Start Date End Date Taking? Authorizing Provider  acyclovir  (ZOVIRAX ) 400 MG tablet Take 400 mg by mouth 2 (two) times daily. 02/23/17   [provider]  aspirin  EC 81 MG tablet Take 81 mg by mouth daily. Swallow whole.    [provider]  atorvastatin  (LIPITOR) 40 MG tablet TAKE 1 TABLET(40 MG) BY MOUTH DAILY 05/03/24   O'Neal, Darryle Ned, MD  buPROPion  (WELLBUTRIN  XL) 150 MG 24 hr tablet Take 150 mg by mouth daily.    [provider]  gabapentin  (NEURONTIN ) 300 MG capsule Take 300 mg by mouth at bedtime.    [provider]  HYDROcodone -acetaminophen   (NORCO/VICODIN) 5-325 MG tablet Take 1 tablet by mouth every 6 (six) hours as needed for moderate pain.    [provider]  levothyroxine  (SYNTHROID , LEVOTHROID) 50 MCG tablet Take 50 mcg by mouth daily before breakfast. 02/17/17   [provider]  lisinopril  (ZESTRIL ) 20 MG tablet TAKE 1 TABLET(20 MG) BY MOUTH DAILY 07/28/24   O'Neal, Darryle Ned, MD  metoprolol  tartrate (LOPRESSOR ) 25 MG tablet Take 0.5 tablets (12.5 mg total) by mouth daily as needed (HR>100). 07/19/22   Milissa Tod PARAS, MD  nitroGLYCERIN  (NITROSTAT ) 0.4 MG SL tablet Place 1 tablet (0.4 mg total) under the tongue every 5 (five) minutes x 3 doses as needed for chest pain. 07/29/21   Zhao, Xika, NP  omeprazole (PRILOSEC) 40 MG capsule Take 40 mg by mouth daily.    [provider]  ondansetron  (ZOFRAN -ODT) 4 MG disintegrating tablet 4mg  ODT q4 hours prn nausea/vomit 07/25/24   Zammit, Joseph, MD  Potassium 99 MG TABS Take 198 mg by mouth at bedtime.    [provider]                                                                                 Vitals:   09/19/24 1245 09/19/24 1445 09/19/24 1506 09/19/24 1800  BP: 133/79 (!) 141/90  (!) 141/117  Pulse: 90 88  95  Resp: (!) 22 19  (!) 23  Temp:   97.8 F (36.6 C)   TempSrc:   Oral   SpO2: 97% 96%  91%   Physical Exam Vitals reviewed.  Constitutional:      General: She is not in acute distress.    Appearance: She is not ill-appearing.  HENT:     Head: Normocephalic and atraumatic.     Mouth/Throat:     Mouth: Mucous membranes are dry.  Eyes:     Extraocular Movements: Extraocular movements intact.  Cardiovascular:     Rate and Rhythm: Normal rate and regular rhythm.     Pulses: Normal pulses.     Heart sounds: Normal heart sounds.  Pulmonary:     Effort: Pulmonary effort is normal.     Breath sounds: Normal breath sounds.  Abdominal:     General: There is no distension.     Palpations: Abdomen is soft.     Tenderness: There is no  abdominal tenderness.  Musculoskeletal:     Right lower leg: No edema.  Left lower leg: No edema.  Skin:    General: Skin is dry.  Neurological:     General: No focal deficit present.     Mental Status: She is alert and oriented to person, place, and time.     Labs on Admission: I have personally reviewed following labs and imaging studies CBC: Recent Labs  Lab 09/19/24 1135  WBC 7.9  NEUTROABS 5.6  HGB 12.3  HCT 36.5  MCV 88.4  PLT 339   Basic Metabolic Panel: Recent Labs  Lab 09/19/24 1135  NA 143  K 3.1*  CL 107  CO2 21*  GLUCOSE 70  BUN 5*  CREATININE 0.94  CALCIUM  5.6*   GFR: CrCl cannot be calculated (Unknown ideal weight.). Liver Function Tests: Recent Labs  Lab 09/19/24 1135  AST 23  ALT 13  ALKPHOS 58  BILITOT 1.0  PROT 4.8*  ALBUMIN 2.2*   No results for input(s): LIPASE, AMYLASE in the last 168 hours. No results for input(s): AMMONIA in the last 168 hours. Recent Labs    07/25/24 1430 09/19/24 1135  BUN 15 5*  CREATININE 0.97 0.94    Cardiac Enzymes: No results for input(s): CKTOTAL, CKMB, CKMBINDEX, TROPONINI in the last 168 hours. BNP (last 3 results) Recent Labs    07/27/24 1435  PROBNP 212   HbA1C: No results for input(s): HGBA1C in the last 72 hours. CBG: Recent Labs  Lab 09/19/24 1144 09/19/24 1207  GLUCAP 67* 127*   Lipid Profile: No results for input(s): CHOL, HDL, LDLCALC, TRIG, CHOLHDL, LDLDIRECT in the last 72 hours. Thyroid  Function Tests: No results for input(s): TSH, T4TOTAL, FREET4, T3FREE, THYROIDAB in the last 72 hours. Anemia Panel: No results for input(s): VITAMINB12, FOLATE, FERRITIN, TIBC, IRON, RETICCTPCT in the last 72 hours. Urine analysis:    Component Value Date/Time   COLORURINE YELLOW 07/25/2024 1414   APPEARANCEUR CLEAR 07/25/2024 1414   LABSPEC 1.014 07/25/2024 1414   PHURINE 7.0 07/25/2024 1414   GLUCOSEU NEGATIVE 07/25/2024 1414    HGBUR NEGATIVE 07/25/2024 1414   BILIRUBINUR NEGATIVE 07/25/2024 1414   KETONESUR NEGATIVE 07/25/2024 1414   PROTEINUR NEGATIVE 07/25/2024 1414   NITRITE NEGATIVE 07/25/2024 1414   LEUKOCYTESUR TRACE (A) 07/25/2024 1414   Radiological Exams on Admission: MR BRAIN WO CONTRAST Result Date: 09/19/2024 EXAM: MRI BRAIN WITHOUT CONTRAST 09/19/2024 01:50:00 PM TECHNIQUE: Multiplanar multisequence MRI of the head/brain was performed without the administration of intravenous contrast. COMPARISON: CT head from today. CLINICAL HISTORY: Neuro deficit, acute, stroke suspected FINDINGS: BRAIN AND VENTRICLES: No acute infarct. Small remote left parietal cortical infarct. Mild for age T2/FLAIR hyperintensity in the white matter, compatible with coronary microvascular ischemic changes. No intracranial hemorrhage. No mass. No midline shift. No hydrocephalus. Normal flow voids. ORBITS: No acute abnormality. SINUSES AND MASTOIDS: Clear sinuses. Trace right mastoid effusion. BONES AND SOFT TISSUES: Normal marrow signal. No acute soft tissue abnormality. IMPRESSION: 1. No acute intracranial abnormality. Electronically signed by: Gilmore Molt MD 09/19/2024 03:00 PM EST RP Workstation: [id number]   DG Chest Port 1 View Result Date: 09/19/2024 EXAM: 1 VIEW(S) XRAY OF THE CHEST 09/19/2024 11:54:00 AM COMPARISON: 07/25/2024 CLINICAL HISTORY: SOB FINDINGS: LUNGS AND PLEURA: No focal pulmonary opacity. No pulmonary edema. Small right pleural effusion, new since prior. No pneumothorax. HEART AND MEDIASTINUM: Cardiomegaly. Calcified aorta. BONES AND SOFT TISSUES: No acute osseous abnormality. IMPRESSION: 1. Small right pleural effusion, new since prior. 2. Cardiomegaly and calcified aorta. Electronically signed by: Evalene Coho MD 09/19/2024 12:19 PM EST RP Workstation: [id number]  CT HEAD WO CONTRAST Result Date: 09/19/2024 EXAM: CT HEAD WITHOUT CONTRAST 09/19/2024 12:04:00 PM TECHNIQUE: CT of the head was performed  without the administration of intravenous contrast. Automated exposure control, iterative reconstruction, and/or weight based adjustment of the mA/kV was utilized to reduce the radiation dose to as low as reasonably achievable. COMPARISON: Maxillofacial CT dated 12/31/2022. CLINICAL HISTORY: Neuro deficit, acute, stroke suspected. FINDINGS: BRAIN AND VENTRICLES: No acute hemorrhage. No evidence of acute infarct. No hydrocephalus. No extra-axial collection. No mass effect or midline shift. Atherosclerotic calcifications within the cavernous internal carotid arteries. ORBITS: Bilateral lens replacement. SINUSES: Status post facial reconstruction surgery. Interval near complete resolution of left maxillary sinus disease. SOFT TISSUES AND SKULL: No acute soft tissue abnormality. No skull fracture. IMPRESSION: 1. No acute intracranial abnormality. Electronically signed by: Evalene Coho MD 09/19/2024 12:19 PM EST RP Workstation: [id number]   Data Reviewed: Relevant notes from primary care and specialist visits, past discharge summaries as available in EHR, including Care Everywhere . Prior diagnostic testing as pertinent to current admission diagnoses, Updated medications and problem lists for reconciliation .ED course, including vitals, labs, imaging, treatment and response to treatment,Triage notes, nursing and pharmacy notes and ED provider's notes.Notable results as noted in HPI.Discussed case with EDMD/ ED APP/ or Specialty MD on call and as needed.  Assessment & Plan   >> Hypocalcemia: Patient presenting with tremors, weakness, dehydration and generally feeling unwell progressively over the past week and worse this morning.  In the ED found to have a severely low calcium  level at 5.6. Will admit patient in telemetry unit with cardiac monitoring will continue with calcium  checks every 6 hours and replace.  Magnesium  level has been ordered as add-on and was lost by lab and we have added down again and for  recheck and replace as needed. With initial EKG did show prolonged QT of 516 otherwise sinus rhythm at 83 PR 154 QRS of 100 nonspecific T wave abnormalities diffusely in all leads.  Along with artifacts.  Repeat EKG shows improvement of QTc and IVCD and RBBB. Workup ordered includes intact PTH vitamin D 125 and 25 dihydroxy levels, calcitonin levels, magnesium  and phosphorus.  >> Essential hypertension: Vitals:   09/19/24 1107 09/19/24 1115 09/19/24 1245 09/19/24 1445  BP: 132/83 (!) 123/98 133/79 (!) 141/90   09/19/24 1800  BP: (!) 141/117  Continue patient on lisinopril .   >> Hypothyroidism: Will continue patient's PTA levothyroxine  at 50 mcg.    >> Chronic diastolic congestive heart failure: Strict I's and O's, cautious IV fluid hydration with LR as she is dehydrated.  Daily weights.  >> CAD: Continue patient on aspirin  81, atorvastatin  40, metoprolol  25.  >> Nausea/GERD: IV PPI and metoclopramide  as needed. Sips of liquids advance to clear liquid diet as tolerated.   DVT prophylaxis:  Heparin .  Consults:  None  Advance Care Planning:    Code Status: Full Code   Family Communication:  Sister Disposition Plan:  Home Severity of Illness: The appropriate patient status for this patient is INPATIENT. Inpatient status is judged to be reasonable and necessary in order to provide the required intensity of service to ensure the patient's safety. The patient's presenting symptoms, physical exam findings, and initial radiographic and laboratory data in the context of their chronic comorbidities is felt to place them at high risk for further clinical deterioration. Furthermore, it is not anticipated that the patient will be medically stable for discharge from the hospital within 2 midnights of admission.   * I certify  that at the point of admission it is my clinical judgment that the patient will require inpatient hospital care spanning beyond 2 midnights from the point of  admission due to high intensity of service, high risk for further deterioration and high frequency of surveillance required.*  Unresulted Labs (From admission, onward)     Start     Ordered   09/20/24 0500  Comprehensive metabolic panel  Tomorrow morning,   R        09/19/24 1613   09/20/24 0500  CBC  Tomorrow morning,   R        09/19/24 1613   09/20/24 0500  Magnesium   Now then every 4 hours,   R (with TIMED occurrences)      09/19/24 1844   09/19/24 2205  PTH, intact and calcium   Now then every 4 hours,   AD (with TIMED occurrences)      09/19/24 1844   09/19/24 1805  Calcitriol (1,25 di-OH Vit D)  Once,   AD        09/19/24 1805   09/19/24 1805  VITAMIN D 25 Hydroxy (Vit-D Deficiency, Fractures)  Once,   AD        09/19/24 1805   09/19/24 1525  T4, free  Add-on,   AD        09/19/24 1525   09/19/24 1525  TSH  Add-on,   AD        09/19/24 1525   09/19/24 1410  Magnesium   Add-on,   AD        09/19/24 1409   09/19/24 1410  Phosphorus  Add-on,   AD        09/19/24 1409   09/19/24 1135  Urine rapid drug screen (hosp performed)  (Not Code Stroke (No thrombolytic / No IR, Stroke suspected.))  Once,   STAT       Question:  Indication  Answer:  Altered mental status, unspecified R41.82   09/19/24 1134   09/19/24 1135  Urinalysis, w/ Reflex to Culture (Infection Suspected) -Urine, Clean Catch  Once,   URGENT       Question:  Specimen Source  Answer:  Urine, Clean Catch   09/19/24 1134            Meds ordered this encounter  Medications   dextrose  50 % solution 25 mL   calcium  gluconate 1 g/ 50 mL sodium chloride  IVPB   aspirin  EC tablet 81 mg   atorvastatin  (LIPITOR) tablet 40 mg   buPROPion  (WELLBUTRIN  XL) 24 hr tablet 150 mg   levothyroxine  (SYNTHROID ) tablet 50 mcg   lisinopril  (ZESTRIL ) tablet 10 mg   metoprolol  tartrate (LOPRESSOR ) tablet 12.5 mg   heparin  injection 5,000 Units   sodium chloride  flush (NS) 0.9 % injection 3 mL   0.9 %  sodium chloride  infusion   OR  Linked Order Group    acetaminophen  (TYLENOL ) tablet 650 mg    acetaminophen  (TYLENOL ) suppository 650 mg   metoCLOPramide  (REGLAN ) injection 5 mg   pantoprazole  (PROTONIX ) injection 40 mg   lactated ringers  infusion     Orders Placed This Encounter  Procedures   CT HEAD WO CONTRAST   DG Chest Port 1 View   MR BRAIN WO CONTRAST   Protime-INR   APTT   CBC   Differential   Comprehensive metabolic panel   Ethanol   Urine rapid drug screen (hosp performed)   Urinalysis, w/ Reflex to Culture (Infection Suspected) -Urine, Clean Catch   Magnesium   Phosphorus   T4, free   TSH   Comprehensive metabolic panel   CBC   Calcitriol (1,25 di-OH Vit D)   VITAMIN D 25 Hydroxy (Vit-D Deficiency, Fractures)   Magnesium    PTH, intact and calcium    Diet Heart Room service appropriate? Yes; Fluid consistency: Thin   Vital signs q 2 hours x 12 hours, then q 4 hours   ED Cardiac monitoring   Swallow screen   Nurse notify provider if SBP > 220/120   If O2 sat <94% Administer O2 @ 2 Liters/Minute   Initiate Carrier Fluid Protocol   Maintain IV access   Vital signs   Notify physician (specify)   Mobility Protocol: No Restrictions   Refer to Sidebar Report Mobility Protocol for Adult Inpatient   Initiate Adult Central Line Maintenance and Catheter Clearance Protocol for patients with central line (CVC, PICC, Port, Hemodialysis, Trialysis)   Daily weights   Intake and Output   Initiate CHG Protocol for patients in ICU/SD or any patient with a central line or foley catheter   Do not place and if present remove PureWick   Initiate Oral Care Protocol   Initiate Carrier Fluid Protocol   RN may order General Admission PRN Orders utilizing General Admission PRN medications (through manage orders) for the following patient needs: allergy symptoms (Claritin), cold sores (Carmex), cough (Robitussin DM), eye irritation (Liquifilm Tears), hemorrhoids (Tucks), indigestion (Maalox), minor skin irritation  (Hydrocortisone Cream), muscle pain Lucienne Gay), nose irritation (saline nasal spray) and sore throat (Chloraseptic spray).   Cardiac Monitoring - Continuous Indefinite   Ambulate with assistance   Full code   Consult to hospitalist   ED Pulse oximetry, continuous   Pulse oximetry check with vital signs   Oxygen therapy Mode or (Route): Nasal cannula; Liters Per Minute: 2; Keep O2 saturation between: greater than 92 %   CBG monitoring, ED   CBG monitoring, ED   ED EKG   EKG 12-Lead   EKG 12-Lead   Saline lock IV   Admit to Inpatient (patient's expected length of stay will be greater than 2 midnights or inpatient only procedure)   Aspiration precautions   Fall precautions    Author: Mario LULLA Blanch, MD 12 pm- 8 pm. [facility]. 09/19/2024 6:46 PM Please note for any communication after hours contact [facility] Assigned provider on call on Amion.

## 2024-09-19 NOTE — ED Triage Notes (Addendum)
 Pt bib [facility] from home c/o generalized weakness x3 days. Began vomiting last night. C/o double vision, numbness and tingling in both hands and feet. Pt is also having tremors which she has at baseline but family states they seem to be worse. Pt has hx of anxiety. Ems gave 4mg  of zofran  with relief.  20 G L hand 130/90 90 hr NSR 96% ra 111 CBG

## 2024-09-20 ENCOUNTER — Inpatient Hospital Stay (HOSPITAL_COMMUNITY)

## 2024-09-20 ENCOUNTER — Encounter (HOSPITAL_COMMUNITY): Payer: Self-pay | Admitting: Internal Medicine

## 2024-09-20 DIAGNOSIS — R251 Tremor, unspecified: Secondary | ICD-10-CM | POA: Diagnosis not present

## 2024-09-20 LAB — CBC
HCT: 38.3 % (ref 36.0–46.0)
Hemoglobin: 13.1 g/dL (ref 12.0–15.0)
MCH: 30 pg (ref 26.0–34.0)
MCHC: 34.2 g/dL (ref 30.0–36.0)
MCV: 87.8 fL (ref 80.0–100.0)
Platelets: 320 K/uL (ref 150–400)
RBC: 4.36 MIL/uL (ref 3.87–5.11)
RDW: 15.6 % — ABNORMAL HIGH (ref 11.5–15.5)
WBC: 7.8 K/uL (ref 4.0–10.5)
nRBC: 0 % (ref 0.0–0.2)

## 2024-09-20 LAB — FOLATE: Folate: 14.9 ng/mL (ref >5.9)

## 2024-09-20 LAB — VITAMIN B12: Vitamin B-12: 403 pg/mL (ref 180–914)

## 2024-09-20 LAB — RAPID URINE DRUG SCREEN, HOSP PERFORMED
Amphetamines: NOT DETECTED
Barbiturates: NOT DETECTED
Benzodiazepines: NOT DETECTED
Cocaine: NOT DETECTED
Opiates: NOT DETECTED
Tetrahydrocannabinol: NOT DETECTED

## 2024-09-20 LAB — URINALYSIS, W/ REFLEX TO CULTURE (INFECTION SUSPECTED)
Bilirubin Urine: NEGATIVE
Glucose, UA: NEGATIVE mg/dL
Hgb urine dipstick: NEGATIVE
Ketones, ur: 5 mg/dL — AB
Nitrite: NEGATIVE
Protein, ur: NEGATIVE mg/dL
Specific Gravity, Urine: 1.041 — ABNORMAL HIGH (ref 1.005–1.030)
pH: 6 (ref 5.0–8.0)

## 2024-09-20 LAB — BASIC METABOLIC PANEL WITH GFR
Anion gap: 12 (ref 5–15)
BUN: 9 mg/dL (ref 8–23)
CO2: 21 mmol/L — ABNORMAL LOW (ref 22–32)
Calcium: 6.7 mg/dL — ABNORMAL LOW (ref 8.9–10.3)
Chloride: 103 mmol/L (ref 98–111)
Creatinine, Ser: 1.16 mg/dL — ABNORMAL HIGH (ref 0.44–1.00)
GFR, Estimated: 49 mL/min — ABNORMAL LOW (ref >60)
Glucose, Bld: 121 mg/dL — ABNORMAL HIGH (ref 70–99)
Potassium: 4.1 mmol/L (ref 3.5–5.1)
Sodium: 136 mmol/L (ref 135–145)

## 2024-09-20 LAB — MAGNESIUM
Magnesium: 1.6 mg/dL — ABNORMAL LOW (ref 1.7–2.4)
Magnesium: 2.3 mg/dL (ref 1.7–2.4)

## 2024-09-20 LAB — COMPREHENSIVE METABOLIC PANEL WITH GFR
ALT: 13 U/L (ref 0–44)
AST: 22 U/L (ref 15–41)
Albumin: 2.3 g/dL — ABNORMAL LOW (ref 3.5–5.0)
Alkaline Phosphatase: 66 U/L (ref 38–126)
Anion gap: 15 (ref 5–15)
BUN: 5 mg/dL — ABNORMAL LOW (ref 8–23)
CO2: 23 mmol/L (ref 22–32)
Calcium: 6.1 mg/dL — CL (ref 8.9–10.3)
Chloride: 102 mmol/L (ref 98–111)
Creatinine, Ser: 0.95 mg/dL (ref 0.44–1.00)
GFR, Estimated: >60 mL/min (ref >60)
Glucose, Bld: 67 mg/dL — ABNORMAL LOW (ref 70–99)
Potassium: 2.9 mmol/L — ABNORMAL LOW (ref 3.5–5.1)
Sodium: 140 mmol/L (ref 135–145)
Total Bilirubin: 1 mg/dL (ref 0.0–1.2)
Total Protein: 5 g/dL — ABNORMAL LOW (ref 6.5–8.1)

## 2024-09-20 LAB — CBG MONITORING, ED: Glucose-Capillary: 79 mg/dL (ref 70–99)

## 2024-09-20 LAB — VITAMIN D 25 HYDROXY (VIT D DEFICIENCY, FRACTURES): Vit D, 25-Hydroxy: 86.27 ng/mL (ref 30–100)

## 2024-09-20 LAB — NA AND K (SODIUM & POTASSIUM), RAND UR
Potassium Urine: 36 mmol/L
Sodium, Ur: 64 mmol/L

## 2024-09-20 MED ORDER — IOHEXOL 350 MG/ML SOLN
75.0000 mL | Freq: Once | INTRAVENOUS | Status: AC | PRN
  Administered 2024-09-20: 75 mL via INTRAVENOUS

## 2024-09-20 MED ORDER — ENSURE PLUS HIGH PROTEIN PO LIQD
237.0000 mL | Freq: Two times a day (BID) | ORAL | Status: DC
  Administered 2024-09-20 – 2024-09-23 (×6): 237 mL via ORAL

## 2024-09-20 MED ORDER — ACYCLOVIR 400 MG PO TABS
400.0000 mg | ORAL_TABLET | Freq: Two times a day (BID) | ORAL | Status: DC
  Administered 2024-09-20 – 2024-09-23 (×7): 400 mg via ORAL
  Filled 2024-09-20 (×8): qty 1

## 2024-09-20 MED ORDER — MAGNESIUM SULFATE 4 GM/100ML IV SOLN
4.0000 g | Freq: Once | INTRAVENOUS | Status: AC
  Administered 2024-09-20: 4 g via INTRAVENOUS
  Filled 2024-09-20: qty 100

## 2024-09-20 MED ORDER — FAMOTIDINE 20 MG PO TABS
20.0000 mg | ORAL_TABLET | Freq: Two times a day (BID) | ORAL | Status: DC
  Administered 2024-09-20 – 2024-09-23 (×7): 20 mg via ORAL
  Filled 2024-09-20 (×7): qty 1

## 2024-09-20 MED ORDER — HYDROXYZINE HCL 25 MG PO TABS
25.0000 mg | ORAL_TABLET | Freq: Two times a day (BID) | ORAL | Status: DC | PRN
  Administered 2024-09-20 – 2024-09-22 (×3): 25 mg via ORAL
  Filled 2024-09-20 (×3): qty 1

## 2024-09-20 MED ORDER — CALCIUM CARBONATE 1250 (500 CA) MG PO TABS
1.0000 | ORAL_TABLET | Freq: Three times a day (TID) | ORAL | Status: DC
  Administered 2024-09-20 – 2024-09-23 (×9): 1250 mg via ORAL
  Filled 2024-09-20 (×11): qty 1

## 2024-09-20 MED ORDER — CALCIUM GLUCONATE-NACL 1-0.675 GM/50ML-% IV SOLN: 1.0000 g | Freq: Four times a day (QID) | INTRAVENOUS | Status: DC

## 2024-09-20 MED ORDER — CALCIUM GLUCONATE-NACL 1-0.675 GM/50ML-% IV SOLN
1.0000 g | Freq: Four times a day (QID) | INTRAVENOUS | Status: AC
  Administered 2024-09-20 (×3): 1000 mg via INTRAVENOUS
  Filled 2024-09-20 (×3): qty 50

## 2024-09-20 MED ORDER — LOPERAMIDE HCL 2 MG PO CAPS
2.0000 mg | ORAL_CAPSULE | ORAL | Status: DC | PRN
Start: 2024-09-20 — End: 2024-09-21
  Administered 2024-09-21: 2 mg via ORAL
  Filled 2024-09-20: qty 1

## 2024-09-20 MED ORDER — POTASSIUM CHLORIDE CRYS ER 20 MEQ PO TBCR
40.0000 meq | EXTENDED_RELEASE_TABLET | Freq: Three times a day (TID) | ORAL | Status: AC
  Administered 2024-09-20 – 2024-09-21 (×4): 40 meq via ORAL
  Filled 2024-09-20 (×4): qty 2

## 2024-09-20 NOTE — Progress Notes (Signed)
 [facility] night cross cover note:   I was notified by the patient's RN of the patient's request for antimotility agent in the setting of loose stool.  Per brief chart review, she has been afebrile and without leukocytosis.  I subsequently ordered prn Imodium.    Eva Pore, DO Hospitalist

## 2024-09-20 NOTE — Plan of Care (Signed)

## 2024-09-20 NOTE — ED Notes (Signed)
 Could not get bp because patient complains that it hurts too bad.

## 2024-09-20 NOTE — TOC CM/SW Note (Signed)
 Transition of Care [facility]) - Inpatient Brief Assessment   Patient Details  Name: Stephanie Daniel MRN: [id number] Date of Birth: 1951/09/18  [facility] of [facility]) CM/SW Contact:    Lauraine FORBES Saa, LCSWA Phone Number: 09/20/2024, 3:16 PM   Clinical Narrative:  3:17 PM Per chart review, patient resides at home alone. Patient has a PCP and insurance. Patient does not have SNF or HH history. Patient has DME (RW) history. Patient's preferred pharmacy is [facility] [phone] [facility]. No TOC needs identified at this time. TOC will continue to follow.  Transition of Care Asessment: Insurance and Status: Insurance coverage has been reviewed Patient has primary care physician: Yes Home environment has been reviewed: Private Residence Prior level of function:: N/A Prior/Current Home Services: No current home services Social Drivers of Health Review: SDOH reviewed no interventions necessary Readmission risk has been reviewed: Yes (Currently Yellow 17%) Transition of care needs: no transition of care needs at this time

## 2024-09-20 NOTE — Progress Notes (Signed)
 PROGRESS NOTE    Stephanie Daniel  FMW:[id number] DOB: 1951/07/28 DOA: 09/19/2024 PCP: [facility]    Brief Narrative:  73 year old with history of coronary artery disease, SVT, hypertension and hyperlipidemia presented to the emergency room with at least 1 week of extreme weakness, tingling and numbness of all extremities, worsening baseline tremors.  Patient apparently was having poor appetite, weakness for about 6 weeks now.  She has mostly unable to eat, had episodes of vertigo and vomiting. Patient has been investigated as outpatient at [facility] GI office with recent upper GI endoscopy, results unable to be seen however reportedly normal. On arrival to the emergency room, complaining of diplopia, tingling numbness, tremors.  Underwent MRI of the brain that was normal.  She was nonfocal on exam. Found to have severe electrolyte abnormalities including calcium  of 5.9 with albumin of 2.2, magnesium  of less than 0.5 with normal phosphorus.  Admitted with severe electrolyte abnormalities.  Subjective: Patient seen and examined.  After receiving electrolytes, double vision has improved. Her cousin and sister at the bedside.  Patient reported about at least 6 weeks of progressive anorexia, occasional vomiting.  She is on lifelong acyclovir  and omeprazole, no recent change in medications. Also noted leg swelling for the last few weeks.  She has chronic skin pigmentation of both legs.  Patient has become very weak.  Apparently, getting investigated at [facility] office and [facility] office. Her lab test were essentially normal 2 months ago.   Assessment & Plan:   Severe symptomatic hypocalcemia Severe hypomagnesemia Hypokalemia  Probably due to nutritional deficiencies.  On multiple CT scans, patient did not show any evidence of malignancy.  Patient also without any evidence of GI loss, diarrhea.  She has very poor appetite. Calcium  5.9-corrected calcium  7.5-3 additional  dose of calcium  gluconate 1 g every 6 hours.  Start oral calcium  supplementation.  Vitamin D was normal. Magnesium  less than 0.5.  Given 4 g mag rider, additional 4 g mag rider today.  Check levels every 12 hours. Changing PPI to Pepcid due to persistent hypomagnesemia. Potassium 2.9, started on high-dose oral replacement.  Recheck levels every 12 hours. Consult dietitian, need to work on improving appetite.  Will check B12 and folic acid levels. Will check urine sodium and potassium.  Hypoalbuminemia: Due to nutritional deficiency.  Urine did not show any protein that rules out proteinuria.  Hypothyroidism: On Synthroid .  TSH normal.  Coronary artery disease: Stable.  On aspirin  atorvastatin  and metoprolol .  Continue.  Dyspnea on exertion: Likely due to deconditioning and frailty.  Does not have any evidence of congestive heart failure on recent echocardiogram, does not have any evidence of PE or malignancy on CT angiogram of the chest.      DVT prophylaxis: heparin  injection 5,000 Units Start: 09/19/24 1615   Code Status: Full code Family Communication: Cousin at the bedside Disposition Plan: Status is: Inpatient Remains inpatient appropriate because: Severe electrolyte abnormalities     Consultants:  None  Procedures:  None  Antimicrobials:  None     Objective: Vitals:   09/20/24 0645 09/20/24 0700 09/20/24 0737 09/20/24 0800  BP:   (!) 140/105   Pulse: 79 73 76   Resp: (!) 22 (!) 24 17   Temp:   97.8 F (36.6 C) 97.8 F (36.6 C)  TempSrc:   Oral   SpO2: 96% 93% 100%     Intake/Output Summary (Last 24 hours) at 09/20/2024 9071 Last data filed at 09/19/2024 2156 Gross  per 24 hour  Intake 412.98 ml  Output --  Net 412.98 ml   There were no vitals filed for this visit.  Examination:  General exam: Appears calm and comfortable.  Chronically sick looking.  Frail.  Not in any distress. Respiratory system: Clear to auscultation. Respiratory effort normal.   No added sounds.  On room air. Cardiovascular system: S1 & S2 heard, RRR. No JVD, murmurs, rubs, gallops or clicks.  Chronic venous stasis changes, pigmentation of skin and 2+ edema of the leg and dorsum of the feet. Gastrointestinal system: Abdomen is nondistended, soft and nontender. No organomegaly or masses felt. Normal bowel sounds heard. Central nervous system: Alert and oriented. No focal neurological deficits.  Generalized weakness.     Data Reviewed: I have personally reviewed following labs and imaging studies  CBC: Recent Labs  Lab 09/19/24 1135 09/20/24 0550  WBC 7.9 7.8  NEUTROABS 5.6  --   HGB 12.3 13.1  HCT 36.5 38.3  MCV 88.4 87.8  PLT 339 320   Basic Metabolic Panel: Recent Labs  Lab 09/19/24 1135 09/19/24 1430 09/20/24 0550  NA 143  --  140  K 3.1*  --  2.9*  CL 107  --  102  CO2 21*  --  23  GLUCOSE 70  --  67*  BUN 5*  --  5*  CREATININE 0.94  --  0.95  CALCIUM  5.6*  --  6.1*  MG  --  <0.5* 1.6*  PHOS  --  4.9*  --    GFR: CrCl cannot be calculated (Unknown ideal weight.). Liver Function Tests: Recent Labs  Lab 09/19/24 1135 09/20/24 0550  AST 23 22  ALT 13 13  ALKPHOS 58 66  BILITOT 1.0 1.0  PROT 4.8* 5.0*  ALBUMIN 2.2* 2.3*   No results for input(s): LIPASE, AMYLASE in the last 168 hours. No results for input(s): AMMONIA in the last 168 hours. Coagulation Profile: Recent Labs  Lab 09/19/24 1135  INR 1.3*   Cardiac Enzymes: No results for input(s): CKTOTAL, CKMB, CKMBINDEX, TROPONINI in the last 168 hours. BNP (last 3 results) Recent Labs    07/27/24 1435  PROBNP 212   HbA1C: No results for input(s): HGBA1C in the last 72 hours. CBG: Recent Labs  Lab 09/19/24 1144 09/19/24 1207  GLUCAP 67* 127*   Lipid Profile: No results for input(s): CHOL, HDL, LDLCALC, TRIG, CHOLHDL, LDLDIRECT in the last 72 hours. Thyroid  Function Tests: Recent Labs    09/19/24 1430 09/19/24 1525  TSH  --  2.809   FREET4 1.06  --    Anemia Panel: No results for input(s): VITAMINB12, FOLATE, FERRITIN, TIBC, IRON, RETICCTPCT in the last 72 hours. Sepsis Labs: No results for input(s): PROCALCITON, LATICACIDVEN in the last 168 hours.  No results found for this or any previous visit (from the past 240 hours).       Radiology Studies: CT Angio Chest PE W and/or Wo Contrast Result Date: 09/20/2024 EXAM: CTA of the Chest with contrast for PE 09/20/2024 12:38:07 AM TECHNIQUE: CTA of the chest was performed after the administration of 75 mL of iohexol  (OMNIPAQUE ) 350 MG/ML injection. Multiplanar reformatted images are provided for review. MIP images are provided for review. Automated exposure control, iterative reconstruction, and/or weight based adjustment of the mA/kV was utilized to reduce the radiation dose to as low as reasonably achievable. COMPARISON: 07/29/2021 CLINICAL HISTORY: Pulmonary embolism (PE) suspected, high prob. FINDINGS: PULMONARY ARTERIES: Pulmonary arteries are adequately opacified for evaluation. No pulmonary embolism.  Main pulmonary artery is normal in caliber. MEDIASTINUM: The heart and pericardium demonstrate no acute abnormality. Diffuse coronary artery and aortic atherosclerosis. LYMPH NODES: No mediastinal, hilar or axillary lymphadenopathy. LUNGS AND PLEURA: Small bilateral pleural effusions. Thin atelectasis in the lower lobes. No pneumothorax. UPPER ABDOMEN: Limited images of the upper abdomen are unremarkable. SOFT TISSUES AND BONES: Bilateral breast implants grossly unremarkable. No acute bone or soft tissue abnormality. IMPRESSION: 1. No pulmonary embolism. 2. Small bilateral pleural effusions with associated lower lobe atelectasis. 3. Diffuse coronary artery disease, aortic atherosclerosis. Electronically signed by: Franky Crease MD 09/20/2024 01:00 AM EST RP Workstation: [id number]   MR BRAIN WO CONTRAST Result Date: 09/19/2024 EXAM: MRI BRAIN WITHOUT CONTRAST  09/19/2024 01:50:00 PM TECHNIQUE: Multiplanar multisequence MRI of the head/brain was performed without the administration of intravenous contrast. COMPARISON: CT head from today. CLINICAL HISTORY: Neuro deficit, acute, stroke suspected FINDINGS: BRAIN AND VENTRICLES: No acute infarct. Small remote left parietal cortical infarct. Mild for age T2/FLAIR hyperintensity in the white matter, compatible with coronary microvascular ischemic changes. No intracranial hemorrhage. No mass. No midline shift. No hydrocephalus. Normal flow voids. ORBITS: No acute abnormality. SINUSES AND MASTOIDS: Clear sinuses. Trace right mastoid effusion. BONES AND SOFT TISSUES: Normal marrow signal. No acute soft tissue abnormality. IMPRESSION: 1. No acute intracranial abnormality. Electronically signed by: Gilmore Molt MD 09/19/2024 03:00 PM EST RP Workstation: [id number]   DG Chest Port 1 View Result Date: 09/19/2024 EXAM: 1 VIEW(S) XRAY OF THE CHEST 09/19/2024 11:54:00 AM COMPARISON: 07/25/2024 CLINICAL HISTORY: SOB FINDINGS: LUNGS AND PLEURA: No focal pulmonary opacity. No pulmonary edema. Small right pleural effusion, new since prior. No pneumothorax. HEART AND MEDIASTINUM: Cardiomegaly. Calcified aorta. BONES AND SOFT TISSUES: No acute osseous abnormality. IMPRESSION: 1. Small right pleural effusion, new since prior. 2. Cardiomegaly and calcified aorta. Electronically signed by: Evalene Coho MD 09/19/2024 12:19 PM EST RP Workstation: [id number]   CT HEAD WO CONTRAST Result Date: 09/19/2024 EXAM: CT HEAD WITHOUT CONTRAST 09/19/2024 12:04:00 PM TECHNIQUE: CT of the head was performed without the administration of intravenous contrast. Automated exposure control, iterative reconstruction, and/or weight based adjustment of the mA/kV was utilized to reduce the radiation dose to as low as reasonably achievable. COMPARISON: Maxillofacial CT dated 12/31/2022. CLINICAL HISTORY: Neuro deficit, acute, stroke suspected. FINDINGS: BRAIN  AND VENTRICLES: No acute hemorrhage. No evidence of acute infarct. No hydrocephalus. No extra-axial collection. No mass effect or midline shift. Atherosclerotic calcifications within the cavernous internal carotid arteries. ORBITS: Bilateral lens replacement. SINUSES: Status post facial reconstruction surgery. Interval near complete resolution of left maxillary sinus disease. SOFT TISSUES AND SKULL: No acute soft tissue abnormality. No skull fracture. IMPRESSION: 1. No acute intracranial abnormality. Electronically signed by: Timothy Berrigan MD 09/19/2024 12:19 PM EST RP Workstation: [id number]        Scheduled Meds:  acyclovir   400 mg Oral BID   aspirin  EC  81 mg Oral Daily   atorvastatin   40 mg Oral Daily   buPROPion   150 mg Oral Daily   calcium  carbonate  1 tablet Oral TID WC   famotidine  20 mg Oral BID   heparin   5,000 Units Subcutaneous Q8H   levothyroxine   50 mcg Oral QAC breakfast   lisinopril   10 mg Oral Daily   potassium chloride   40 mEq Oral TID   sodium chloride  flush  3 mL Intravenous Q12H   Continuous Infusions:  sodium chloride  Stopped (09/19/24 1735)   calcium  gluconate     magnesium  sulfate bolus IVPB  LOS: 1 day    Time spent: 55 minutes    Renato Applebaum, MD [facility]

## 2024-09-20 NOTE — ED Notes (Signed)
 Morning meds delayed due to multiple tasks not done on nightshift - No nursing tech in the area as well.

## 2024-09-20 NOTE — ED Notes (Signed)
 Introduced self to patient at this time. Patient is resting in bed with visible chest rise and fall. The call light is in reach. There are no further requests at this time.

## 2024-09-20 NOTE — ED Notes (Signed)
 Patient helped to bedside commode to void .

## 2024-09-21 DIAGNOSIS — E43 Unspecified severe protein-calorie malnutrition: Secondary | ICD-10-CM | POA: Insufficient documentation

## 2024-09-21 DIAGNOSIS — H532 Diplopia: Secondary | ICD-10-CM | POA: Diagnosis not present

## 2024-09-21 DIAGNOSIS — R251 Tremor, unspecified: Secondary | ICD-10-CM | POA: Diagnosis not present

## 2024-09-21 LAB — PTH, INTACT AND CALCIUM
Calcium, Total (PTH): 5.7 mg/dL — CL (ref 8.7–10.3)
Calcium, Total (PTH): 6 mg/dL — CL (ref 8.7–10.3)
PTH: 42 pg/mL (ref 15–65)
PTH: 57 pg/mL (ref 15–65)

## 2024-09-21 LAB — C DIFFICILE QUICK SCREEN W PCR REFLEX
C Diff antigen: NEGATIVE
C Diff interpretation: NOT DETECTED
C Diff toxin: NEGATIVE

## 2024-09-21 LAB — BASIC METABOLIC PANEL WITH GFR
Anion gap: 9 (ref 5–15)
BUN: 7 mg/dL — ABNORMAL LOW (ref 8–23)
CO2: 23 mmol/L (ref 22–32)
Calcium: 7.1 mg/dL — ABNORMAL LOW (ref 8.9–10.3)
Chloride: 103 mmol/L (ref 98–111)
Creatinine, Ser: 0.86 mg/dL (ref 0.44–1.00)
GFR, Estimated: >60 mL/min (ref >60)
Glucose, Bld: 72 mg/dL (ref 70–99)
Potassium: 4.7 mmol/L (ref 3.5–5.1)
Sodium: 135 mmol/L (ref 135–145)

## 2024-09-21 LAB — MAGNESIUM: Magnesium: 2 mg/dL (ref 1.7–2.4)

## 2024-09-21 LAB — CALCITRIOL (1,25 DI-OH VIT D): Vit D, 1,25-Dihydroxy: 68.3 pg/mL (ref 24.8–81.5)

## 2024-09-21 MED ORDER — THIAMINE MONONITRATE 100 MG PO TABS
100.0000 mg | ORAL_TABLET | Freq: Every day | ORAL | Status: DC
  Administered 2024-09-21 – 2024-09-23 (×3): 100 mg via ORAL
  Filled 2024-09-21 (×3): qty 1

## 2024-09-21 MED ORDER — ADULT MULTIVITAMIN W/MINERALS CH
1.0000 | ORAL_TABLET | Freq: Every day | ORAL | Status: DC
  Administered 2024-09-21 – 2024-09-23 (×3): 1 via ORAL
  Filled 2024-09-21 (×3): qty 1

## 2024-09-21 NOTE — Progress Notes (Addendum)
 PROGRESS NOTE        PATIENT DETAILS Name: Stephanie Daniel Age: 73 y.o. Sex: female Date of Birth: 11-Jun-1951 Admit Date: 09/19/2024 Admitting Physician Mario Tobie GAILS, MD PCP:[facility]  Brief Summary: Patient is a 73 y.o.  female with history of HTN, HLD, CAD, SVT-who presented to the ED with generalized weakness/tingling/numbness.  Apparently patient has had some intermittent diarrhea x 3 weeks, some intermittent vomiting for the past 1 month (recent EGD done at [facility] reportedly normal).  While in the emergency room-routine blood work demonstrated severe electrolyte derangement including severe hypomagnesemia (magnesium  <0.5)  Significant events: 11/3>> admit to [facility]  Significant studies: 10/01>> CT abdomen/pelvis: No acute abnormalities. 11/3>> CXR: No acute abnormalities. 11/3>> MRI brain: No acute intracranial abnormality 11/4>> CTA chest: No PE, small bilateral pleural effusions.  Significant microbiology data: 11/5>> GI pathogen panel: Ordered 11/5>> stool C. difficile studies: Ordered  Procedures: None  Consults: None  Subjective: Feels much better-no vomiting x 24 hours  Objective: Vitals: Blood pressure (!) 134/97, pulse 92, temperature (!) 97.4 F (36.3 C), temperature source Oral, resp. rate (!) 24, height 5' 3 (1.6 m), weight 76.1 kg, SpO2 94%.   Exam: Gen Exam:Alert awake-not in any distress HEENT:atraumatic, normocephalic Chest: B/L clear to auscultation anteriorly CVS:S1S2 regular Abdomen:soft non tender, non distended Extremities:no edema Neurology: Non focal Skin: no rash  Pertinent Labs/Radiology:    Latest Ref Rng & Units 09/20/2024    5:50 AM 09/19/2024   11:35 AM 07/25/2024    2:30 PM  CBC  WBC 4.0 - 10.5 K/uL 7.8  7.9  5.3   Hemoglobin 12.0 - 15.0 g/dL 86.8  87.6  86.6   Hematocrit 36.0 - 46.0 % 38.3  36.5  41.2   Platelets 150 - 400 K/uL 320  339  250     Lab Results  Component Value  Date   NA 135 09/21/2024   K 4.7 09/21/2024   CL 103 09/21/2024   CO2 23 09/21/2024      Assessment/Plan: Severe hypomagnesemia Suspect secondary to chronic PPI use (on omeprazole at home)+ recent diarrhea/vomiting Much better after repletion.  Hypocalcemia Likely secondary to PTH being suppressed due to severe hypomagnesemia Suspect some of her symptoms including tremors/tingling/numbness were likely secondary to symptomatic hypocalcemia. Calcium  levels are improving after correcting magnesium  levels-thankfully clinically she is also doing well with resolution of tremors/tingling/numbness. 1, 25-dihydroxy vitamin D level stable. PTH pending.  Vomiting/diarrhea Ongoing for several weeks Recent  outpatient EGD reportedly normal No vomiting x 24 hours Continues to have diarrhea-looks like some of this is incontinence as well but describes stool as watery. Recent outpatient CT abdomen without any significant/acute abnormalities Getting stool studies including C. difficile (on suppressive Keflex for UTI) No further Imodium for now.  Hypothyroidism Synthroid  TSH stable  CAD No anginal symptoms Continue aspirin /statin/  HTN BP stable Continue lisinopril   GERD Has been appropriately switched from PPI to Pepcid given severity of hypomagnesemia.  History of recurrent oral HSV ulcers On acyclovir  chronically  History of recurrent UTI On suppressive Keflex-on hold-see above-awaiting stool studies  Nutrition Status: Nutrition Problem: Severe Malnutrition Etiology: chronic illness Signs/Symptoms: energy intake < 75% for > or equal to 1 month, severe muscle depletion, moderate fat depletion Interventions: Ensure Enlive (each supplement provides 350kcal and 20 grams of protein), MVI   Code status:  Code Status: Full Code   DVT Prophylaxis: heparin  injection 5,000 Units Start: 09/19/24 1615   Family Communication: None at bedside   Disposition Plan: Status is:  Inpatient Remains inpatient appropriate because: Severity of illness   Planned Discharge Destination:Home health   Diet: Diet Order             Diet regular Room service appropriate? Yes; Fluid consistency: Thin  Diet effective now                     Antimicrobial agents: Anti-infectives (From admission, onward)    Start     Dose/Rate Route Frequency Ordered Stop   09/20/24 1000  acyclovir  (ZOVIRAX ) tablet 400 mg        400 mg Oral 2 times daily 09/20/24 0840          MEDICATIONS: Scheduled Meds:  acyclovir   400 mg Oral BID   aspirin  EC  81 mg Oral Daily   atorvastatin   40 mg Oral Daily   buPROPion   150 mg Oral Daily   calcium  carbonate  1 tablet Oral TID WC   famotidine  20 mg Oral BID   feeding supplement  237 mL Oral BID BM   heparin   5,000 Units Subcutaneous Q8H   levothyroxine   50 mcg Oral QAC breakfast   lisinopril   10 mg Oral Daily   multivitamin with minerals  1 tablet Oral Daily   sodium chloride  flush  3 mL Intravenous Q12H   thiamine  100 mg Oral Daily   Continuous Infusions: PRN Meds:.acetaminophen  **OR** acetaminophen , hydrOXYzine, loperamide, metoCLOPramide  (REGLAN ) injection, metoprolol  tartrate   I have personally reviewed following labs and imaging studies  LABORATORY DATA: CBC: Recent Labs  Lab 09/19/24 1135 09/20/24 0550  WBC 7.9 7.8  NEUTROABS 5.6  --   HGB 12.3 13.1  HCT 36.5 38.3  MCV 88.4 87.8  PLT 339 320    Basic Metabolic Panel: Recent Labs  Lab 09/19/24 1135 09/19/24 1430 09/20/24 0550 09/20/24 1848 09/21/24 0542  NA 143  --  140 136 135  K 3.1*  --  2.9* 4.1 4.7  CL 107  --  102 103 103  CO2 21*  --  23 21* 23  GLUCOSE 70  --  67* 121* 72  BUN 5*  --  5* 9 7*  CREATININE 0.94  --  0.95 1.16* 0.86  CALCIUM  5.6*  --  6.1*  See Scanned report in [place] Link 6.7* 7.1*  MG  --  <0.5* 1.6* 2.3 2.0  PHOS  --  4.9*  --   --   --     GFR: Estimated Creatinine Clearance: 55.2 mL/min (by C-G formula based  on SCr of 0.86 mg/dL).  Liver Function Tests: Recent Labs  Lab 09/19/24 1135 09/20/24 0550  AST 23 22  ALT 13 13  ALKPHOS 58 66  BILITOT 1.0 1.0  PROT 4.8* 5.0*  ALBUMIN 2.2* 2.3*   No results for input(s): LIPASE, AMYLASE in the last 168 hours. No results for input(s): AMMONIA in the last 168 hours.  Coagulation Profile: Recent Labs  Lab 09/19/24 1135  INR 1.3*    Cardiac Enzymes: No results for input(s): CKTOTAL, CKMB, CKMBINDEX, TROPONINI in the last 168 hours.  BNP (last 3 results) Recent Labs    07/27/24 1435  PROBNP 212    Lipid Profile: No results for input(s): CHOL, HDL, LDLCALC, TRIG, CHOLHDL, LDLDIRECT in the last 72 hours.  Thyroid  Function Tests: Recent Labs  09/19/24 1430 09/19/24 1525  TSH  --  2.809  FREET4 1.06  --     Anemia Panel: Recent Labs    09/19/24 2210 09/20/24 0500  VITAMINB12 403  --   FOLATE  --  14.9    Urine analysis:    Component Value Date/Time   COLORURINE YELLOW 09/19/2024 0600   APPEARANCEUR CLEAR 09/19/2024 0600   LABSPEC 1.041 (H) 09/19/2024 0600   PHURINE 6.0 09/19/2024 0600   GLUCOSEU NEGATIVE 09/19/2024 0600   HGBUR NEGATIVE 09/19/2024 0600   BILIRUBINUR NEGATIVE 09/19/2024 0600   KETONESUR 5 (A) 09/19/2024 0600   PROTEINUR NEGATIVE 09/19/2024 0600   NITRITE NEGATIVE 09/19/2024 0600   LEUKOCYTESUR MODERATE (A) 09/19/2024 0600    Sepsis Labs: Lactic Acid, Venous No results found for: LATICACIDVEN  MICROBIOLOGY: No results found for this or any previous visit (from the past 240 hours).  RADIOLOGY STUDIES/RESULTS: CT Angio Chest PE W and/or Wo Contrast Result Date: 09/20/2024 EXAM: CTA of the Chest with contrast for PE 09/20/2024 12:38:07 AM TECHNIQUE: CTA of the chest was performed after the administration of 75 mL of iohexol  (OMNIPAQUE ) 350 MG/ML injection. Multiplanar reformatted images are provided for review. MIP images are provided for review. Automated exposure  control, iterative reconstruction, and/or weight based adjustment of the mA/kV was utilized to reduce the radiation dose to as low as reasonably achievable. COMPARISON: 07/29/2021 CLINICAL HISTORY: Pulmonary embolism (PE) suspected, high prob. FINDINGS: PULMONARY ARTERIES: Pulmonary arteries are adequately opacified for evaluation. No pulmonary embolism. Main pulmonary artery is normal in caliber. MEDIASTINUM: The heart and pericardium demonstrate no acute abnormality. Diffuse coronary artery and aortic atherosclerosis. LYMPH NODES: No mediastinal, hilar or axillary lymphadenopathy. LUNGS AND PLEURA: Small bilateral pleural effusions. Thin atelectasis in the lower lobes. No pneumothorax. UPPER ABDOMEN: Limited images of the upper abdomen are unremarkable. SOFT TISSUES AND BONES: Bilateral breast implants grossly unremarkable. No acute bone or soft tissue abnormality. IMPRESSION: 1. No pulmonary embolism. 2. Small bilateral pleural effusions with associated lower lobe atelectasis. 3. Diffuse coronary artery disease, aortic atherosclerosis. Electronically signed by: Franky Crease MD 09/20/2024 01:00 AM EST RP Workstation: [id number]     LOS: 2 days   Donalda Applebaum, MD  [facility]    To contact the attending provider between 7A-7P or the covering provider during after hours 7P-7A, please log into the web site [email or web address] and access using universal [employer] password for that web site. If you do not have the password, please call the hospital operator.  09/21/2024, 3:17 PM

## 2024-09-21 NOTE — Evaluation (Addendum)
 Occupational Therapy Evaluation Patient Details Name: Stephanie Daniel MRN: [id number] DOB: Sep 07, 1951 Today's Date: 09/21/2024   History of Present Illness   Pt is a 73 y.o. female who presented 09/19/24 with weakness, numbness, and tingling in all 4 extremities, worsening baseline tremors, diplopia, and vertigo. She has had a poor appetite for ~6 weeks. Pt admitted with severe symptomatic hypocalcemia, severe hypomagnesemia, and hypokalemia. PMH: CAD, SVT, HTN, HLD, GERD, hypothyroidism     Clinical Impressions Pt is typically independent and lives alone with her granddaughter. She reports having not driven in 2 months, did not elaborate as to why. Pt presents with generalized weakness and mild balance deficits. She mobilizes without AD in her room with up to CGA and needs set up to supervision for ADLs. Pt report, vertigo, tingling in extremities and diplopia have resolved. Her appetite is slowly recovering. Pt is concerned about ongoing LE edema, reports she has a vascular appointment on Friday if she discharges. Will follow acutely. Do not anticipate pt will need post acute OT.     If plan is discharge home, recommend the following:   A little help with walking and/or transfers;A little help with bathing/dressing/bathroom;Assistance with cooking/housework;Assist for transportation;Help with stairs or ramp for entrance     Functional Status Assessment   Patient has had a recent decline in their functional status and demonstrates the ability to make significant improvements in function in a reasonable and predictable amount of time.     Equipment Recommendations   None recommended by OT     Recommendations for Other Services         Precautions/Restrictions   Precautions Precautions: Fall Recall of Precautions/Restrictions: Intact Restrictions Weight Bearing Restrictions Per Provider Order: No     Mobility Bed Mobility               General bed mobility  comments: received in chair    Transfers Overall transfer level: Needs assistance Equipment used: None Transfers: Sit to/from Stand Sit to Stand: Supervision           General transfer comment: supervision for safety and lines, dizziness and vertigo have resolved      Balance Overall balance assessment: Mild deficits observed, not formally tested                                         ADL either performed or assessed with clinical judgement   ADL Overall ADL's : Needs assistance/impaired Eating/Feeding: Independent   Grooming: Wash/dry hands;Standing;Supervision/safety   Upper Body Bathing: Set up;Sitting   Lower Body Bathing: Supervison/ safety;Sit to/from stand   Upper Body Dressing : Set up;Sitting   Lower Body Dressing: Supervision/safety;Sit to/from stand   Toilet Transfer: Ambulation;BSC/3in1;Contact guard assist   Toileting- Clothing Manipulation and Hygiene: Set up;Sitting/lateral lean       Functional mobility during ADLs: Contact guard assist       Vision Baseline Vision/History: 1 Wears glasses Ability to See in Adequate Light: 0 Adequate Patient Visual Report: No change from baseline Additional Comments: wears readers     Perception         Praxis         Pertinent Vitals/Pain Pain Assessment Pain Assessment: No/denies pain     Extremity/Trunk Assessment Upper Extremity Assessment Upper Extremity Assessment: Overall WFL for tasks assessed;Right hand dominant, baseline mild, variable tremor   Lower Extremity Assessment Lower Extremity Assessment: Defer  to PT evaluation   Cervical / Trunk Assessment Cervical / Trunk Assessment: Normal   Communication Communication Communication: No apparent difficulties   Cognition Arousal: Alert Behavior During Therapy: WFL for tasks assessed/performed Cognition: No apparent impairments                               Following commands: Intact        Cueing  General Comments   Cueing Techniques: Verbal cues      Exercises     Shoulder Instructions      Home Living Family/patient expects to be discharged to:: Private residence Living Arrangements: Alone Available Help at Discharge: Family;Available 24 hours/day (granddaughter lives close) Type of Home: House Home Access: Level entry     Home Layout: One level     Bathroom Shower/Tub: Tub/shower unit (walk in tub/shower)   Firefighter: Handicapped height     Home Equipment: Rollator (4 wheels);BSC/3in1;Cane - quad;Shower seat;Grab bars - tub/shower          Prior Functioning/Environment Prior Level of Function : Independent/Modified Independent             Mobility Comments: No AD until she became weak last week ADLs Comments: has not driven in 2 months    OT Problem List: Impaired balance (sitting and/or standing)   OT Treatment/Interventions: Self-care/ADL training;Patient/family education      OT Goals(Current goals can be found in the care plan section)   Acute Rehab OT Goals OT Goal Formulation: With patient Time For Goal Achievement: 10/05/24 Potential to Achieve Goals: Good ADL Goals Additional ADL Goal #1: Pt will complete basic ADLs mod I. Additional ADL Goal #2: Pt will gather items necessary for ADLs mod I.   OT Frequency:  Min 2X/week    Co-evaluation              AM-PAC OT 6 Clicks Daily Activity     Outcome Measure Help from another person eating meals?: None Help from another person taking care of personal grooming?: A Little Help from another person toileting, which includes using toliet, bedpan, or urinal?: A Little Help from another person bathing (including washing, rinsing, drying)?: A Little Help from another person to put on and taking off regular upper body clothing?: A Little Help from another person to put on and taking off regular lower body clothing?: A Little 6 Click Score: 19   End of Session  Equipment Utilized During Treatment: Gait belt  Activity Tolerance: Patient tolerated treatment well Patient left: in chair;with call bell/phone within reach  OT Visit Diagnosis: Unsteadiness on feet (R26.81);Muscle weakness (generalized) (M62.81)                Time: 8542-8482 OT Time Calculation (min): 20 min Charges:  OT General Charges $OT Visit: 1 Visit OT Evaluation $OT Eval Low Complexity: 1 Low  Mliss HERO, OTR/L Acute Rehabilitation Services Office: [phone]  Kennth Mliss Helling 09/21/2024, 3:28 PM

## 2024-09-21 NOTE — Progress Notes (Signed)
 Initial Nutrition Assessment  DOCUMENTATION CODES:   Severe malnutrition in context of chronic illness  INTERVENTION:  Liberalize diet to regular diet from heart-healthy diet to encourage intake. Continue Ensure+ HP BID. Each supplement provides 350 Kcals and 20 grams of protein. Add Thiamine 100 mg PO daily for 7 days. Add Multivitamin PO daily. Continue to monitor electrolytes and replace per protocol.   NUTRITION DIAGNOSIS:   Severe Malnutrition related to chronic illness as evidenced by energy intake < 75% for > or equal to 1 month, severe muscle depletion, moderate fat depletion.   GOAL:   Patient will meet greater than or equal to 90% of their needs   MONITOR:   PO intake, Supplement acceptance, Labs, Weight trends  REASON FOR ASSESSMENT:   Consult, Malnutrition Screening Tool Assessment of nutrition requirement/status  ASSESSMENT:   Patient presented with extreme weakness, numbness/tingling of all extremities, poor appetite 2/2 vertigo and N/V s/p benign GI workup prior to admission, leg swelling and was found to have severe hypocalcemia/hypomagnesemia/hypokalemia. PMH significant for CAD, SVT, HTN, dyslipidemia, hypothyroidism, and GERD.  Visited the patient who states she experienced vertigo 2 months ago with associated N/V. The vertigo spontaneously resolved but the N/V never went away. Her UBW is 159-160 lbs and she reports having lost 10 lbs. Her appetite has improved the past day and she ate 50% of a burger meal last night and would have had more if she received condiments since it was dry. She had 50% of her breakfast this morning with the tray still present during my visit. She tells me that she lives alone and even before she was sick, would at times only open a can of green beans or make rice with tomatoes and have that for meals. Educated the patient on including protein with her meals like canned chicken/tuna. The patient states she is determined to do better  and take better care of herself after this episode. She has Ensure at home from a prior hospitalization and plans on drinking them when she returns home. She has yet to receive Ensure but is amenable to them - provided patient with a chocolate one during my visit. She typically ambulates without issues but has had to rely on a walker the past few weeks due to weakness.   Scheduled Meds:  acyclovir   400 mg Oral BID   aspirin  EC  81 mg Oral Daily   atorvastatin   40 mg Oral Daily   buPROPion   150 mg Oral Daily   calcium  carbonate  1 tablet Oral TID WC   famotidine  20 mg Oral BID   feeding supplement  237 mL Oral BID BM   heparin   5,000 Units Subcutaneous Q8H   levothyroxine   50 mcg Oral QAC breakfast   lisinopril   10 mg Oral Daily   sodium chloride  flush  3 mL Intravenous Q12H    Diet Order             Diet Heart Room service appropriate? Yes; Fluid consistency: Thin  Diet effective now                  Meal Intake: 50%  Labs:     Latest Ref Rng & Units 09/21/2024    5:42 AM 09/20/2024    6:48 PM 09/20/2024    5:50 AM  CMP  Glucose 70 - 99 mg/dL 72  878  67   BUN 8 - 23 mg/dL 7  9  5    Creatinine 0.44 - 1.00 mg/dL  0.86  1.16  0.95   Sodium 135 - 145 mmol/L 135  136  140   Potassium 3.5 - 5.1 mmol/L 4.7  4.1  2.9   Chloride 98 - 111 mmol/L 103  103  102   CO2 22 - 32 mmol/L 23  21  23    Calcium  8.9 - 10.3 mg/dL 7.1  6.7  6.1    PENDING   Total Protein 6.5 - 8.1 g/dL   5.0   Total Bilirubin 0.0 - 1.2 mg/dL   1.0   Alkaline Phos 38 - 126 U/L   66   AST 15 - 41 U/L   22   ALT 0 - 44 U/L   13      I/O: +500 mL since admit  NUTRITION - FOCUSED PHYSICAL EXAM:  Flowsheet Row Most Recent Value  Orbital Region Moderate depletion  Upper Arm Region Moderate depletion  Thoracic and Lumbar Region No depletion  Buccal Region Moderate depletion  Temple Region Moderate depletion  Clavicle Bone Region Moderate depletion  Clavicle and Acromion Bone Region Moderate depletion   Scapular Bone Region Moderate depletion  Dorsal Hand Severe depletion  Patellar Region Severe depletion  Anterior Thigh Region Moderate depletion  Posterior Calf Region Moderate depletion  Edema (RD Assessment) Moderate  [+2 BLE]  Hair Reviewed  Eyes Reviewed  Mouth Reviewed  Skin Reviewed  Nails Unable to assess  [patient had nail polish on]    EDUCATION NEEDS:   Education needs have been addressed  Skin:  Skin Assessment: Reviewed RN Assessment  Last BM:  11/4 type 3  Height:   Ht Readings from Last 1 Encounters:  09/20/24 5' 3 (1.6 m)    Weight:    Ideal Body Weight:  52 kg  BMI:  Body mass index is 29.72 kg/m.  Estimated Nutritional Needs:  Kcal:  1800-2000 Protein:  100-120 Fluid:  >1800    Leverne Ruth, MS, RDN, LDN . [facility] See AMION for contact information

## 2024-09-21 NOTE — Plan of Care (Signed)

## 2024-09-21 NOTE — Evaluation (Signed)
 Physical Therapy Evaluation Patient Details Name: Stephanie Daniel MRN: [id number] DOB: [birth year] Today's Date: 09/21/2024  History of Present Illness  Pt is a 73 y.o. female who presented 09/19/24 with weakness, numbness, and tingling in all 4 extremities, worsening baseline tremors, diplopia, and vertigo. She has had a poor appetite for ~6 weeks. Pt admitted with severe symptomatic hypocalcemia, severe hypomagnesemia, and hypokalemia. PMH: CAD, SVT, HTN, HLD, GERD, hypothyroidism   Clinical Impression  Pt presents with condition above and deficits mentioned below, see PT Problem List. PTA, she was independent without DME, living alone in a 1-level house with a level entry. She has 24/7 care available to her from family if needed. Currently, she displays some mild deficits in balance, endurance, and gross strength. She did display x1 posterior LOB when ambulating without an AD, needing minA to recover, but was otherwise able to ambulate with or without an AD at a CGA-supervision level. She will likely progress well and quickly as she mobilizes more frequently, thus do not anticipate a need for post acute PT at this time. Will continue to follow acutely to maximize her return to baseline prior to d/c home. Encouraged use of rollator initially upon d/c until safe to progress away from it. She verbalized understanding.      If plan is discharge home, recommend the following: Assistance with cooking/housework;Assist for transportation   Can travel by private vehicle        Equipment Recommendations None recommended by PT  Recommendations for Other Services       Functional Status Assessment Patient has had a recent decline in their functional status and demonstrates the ability to make significant improvements in function in a reasonable and predictable amount of time.     Precautions / Restrictions Precautions Precautions: Fall Recall of Precautions/Restrictions: Intact Restrictions Weight  Bearing Restrictions Per Provider Order: No      Mobility  Bed Mobility Overal bed mobility: Modified Independent             General bed mobility comments: No assistance needed, HOB elevated    Transfers Overall transfer level: Needs assistance Equipment used: Rolling walker (2 wheels), None Transfers: Sit to/from Stand Sit to Stand: Supervision           General transfer comment: No LOB standing from EOB 1x to RW and 1x to no AD, supervision for safety    Ambulation/Gait Ambulation/Gait assistance: Contact guard assist, Supervision, Min assist Gait Distance (Feet): 200 Feet (x2 bouts of ~200 ft > ~15 ft) Assistive device: Rolling walker (2 wheels), None Gait Pattern/deviations: Step-through pattern, Decreased stride length, Drifts right/left Gait velocity: reduced Gait velocity interpretation: <1.8 ft/sec, indicate of risk for recurrent falls   General Gait Details: Pt ambulated initially with RW but quickly progressed to no AD. Mild lateral swaying intermittently, x1 minor LOB posteriorly 1x needing minA to recover when not using an AD, but otherwise only needing CGA-supervision for safety. Noted excess lateral trunk flexion bil, pt reports is baseline  Careers Information Officer     Tilt Bed    Modified Rankin (Stroke Patients Only)       Balance Overall balance assessment: Mild deficits observed, not formally tested  Pertinent Vitals/Pain Pain Assessment Pain Assessment: Faces Faces Pain Scale: No hurt Pain Intervention(s): Monitored during session    Home Living Family/patient expects to be discharged to:: Private residence Living Arrangements: Alone Available Help at Discharge: Family;Available 24 hours/day (granddaughter lives nearby) Type of Home: House Home Access: Level entry       Home Layout: One level Home Equipment: Rollator (4 wheels);BSC/3in1;Cane -  quad;Shower seat;Grab bars - tub/shower      Prior Function Prior Level of Function : Independent/Modified Independent             Mobility Comments: No AD until she became weak last week       Extremity/Trunk Assessment   Upper Extremity Assessment Upper Extremity Assessment: Defer to OT evaluation    Lower Extremity Assessment Lower Extremity Assessment: Generalized weakness (mild with functional mobility, subjective report as well; denied numbness/tingling bil)    Cervical / Trunk Assessment Cervical / Trunk Assessment: Normal  Communication   Communication Communication: No apparent difficulties    Cognition Arousal: Alert Behavior During Therapy: WFL for tasks assessed/performed   PT - Cognitive impairments: No apparent impairments                         Following commands: Intact       Cueing Cueing Techniques: Verbal cues     General Comments General comments (skin integrity, edema, etc.): Encouraged use of rollator initially upon d/c until safe to progress away from it, she verbalized understanding    Exercises     Assessment/Plan    PT Assessment Patient needs continued PT services  PT Problem List Decreased strength;Decreased activity tolerance;Decreased balance;Decreased mobility       PT Treatment Interventions DME instruction;Gait training;Functional mobility training;Therapeutic activities;Balance training;Therapeutic exercise;Neuromuscular re-education;Patient/family education    PT Goals (Current goals can be found in the Care Plan section)  Acute Rehab PT Goals Patient Stated Goal: to improve PT Goal Formulation: With patient Time For Goal Achievement: 10/05/24 Potential to Achieve Goals: Good    Frequency Min 1X/week     Co-evaluation               AM-PAC PT 6 Clicks Mobility  Outcome Measure Help needed turning from your back to your side while in a flat bed without using bedrails?: None Help needed moving  from lying on your back to sitting on the side of a flat bed without using bedrails?: None Help needed moving to and from a bed to a chair (including a wheelchair)?: A Little Help needed standing up from a chair using your arms (e.g., wheelchair or bedside chair)?: A Little Help needed to walk in hospital room?: A Little Help needed climbing 3-5 steps with a railing? : A Little 6 Click Score: 20    End of Session Equipment Utilized During Treatment: Gait belt Activity Tolerance: Patient tolerated treatment well Patient left: in chair;with call bell/phone within reach;with chair alarm set Nurse Communication: Mobility status (NT) PT Visit Diagnosis: Unsteadiness on feet (R26.81);Other abnormalities of gait and mobility (R26.89);Muscle weakness (generalized) (M62.81)    Time: 8967-8948 PT Time Calculation (min) (ACUTE ONLY): 19 min   Charges:   PT Evaluation $PT Eval Low Complexity: 1 Low   PT General Charges $$ ACUTE PT VISIT: 1 Visit         Theo Ferretti, PT, DPT Acute Rehabilitation Services  Office: [phone]   [name] 09/21/2024, 11:02 AM

## 2024-09-21 NOTE — Progress Notes (Deleted)
 Patient ID: Stephanie Daniel, female   DOB: [birth year], 73 y.o.   MRN: [id number]  Reason for Consult: No chief complaint on file.   Referred by Leonel Cole, MD  Subjective:     HPI Stephanie Daniel is a 73 y.o. female presenting for evaluation aortic atherosclerotic and mesenteric disease. ***  Past Medical History:  Diagnosis Date   Arthritis    Coronary artery disease    GERD (gastroesophageal reflux disease)    History of kidney stones    Hypertension    Hypothyroidism    Family History  Problem Relation Age of Onset   COPD Mother    Heart disease Mother    Heart disease Father    Cancer Father    COPD Brother    Hypertension Son    Past Surgical History:  Procedure Laterality Date   ABDOMINAL HYSTERECTOMY     APPENDECTOMY     KNEE SURGERY  11/18/2011   SVT ABLATION N/A 08/21/2022   Procedure: SVT ABLATION;  Surgeon: Cindie Ole DASEN, MD;  Location: [facility];  Service: Cardiovascular;  Laterality: N/A;   TONSILLECTOMY     TOTAL KNEE ARTHROPLASTY Left 05/04/2017   Procedure: LEFT TOTAL KNEE ARTHROPLASTY;  Surgeon: Ernie Cough, MD;  Location: [facility];  Service: Orthopedics;  Laterality: Left;  90 mins   TOTAL KNEE ARTHROPLASTY Right 07/11/2022   Procedure: TOTAL KNEE ARTHROPLASTY;  Surgeon: Kay Kemps, MD;  Location: [facility];  Service: Orthopedics;  Laterality: Right;    Short Social History:  Social History   Tobacco Use   Smoking status: Former    Current packs/day: 0.00    Types: Cigarettes    Start date: 04/28/1969    Quit date: 04/28/1989    Years since quitting: 35.4   Smokeless tobacco: Never  Substance Use Topics   Alcohol use: Yes    Comment: Occas    Allergies  Allergen Reactions   Albuterol Other (See Comments)    Respiratory distress   Oxycodone  Nausea And Vomiting    No current facility-administered medications for this visit.   No current outpatient medications on file.   Facility-Administered Medications Ordered in  Other Visits  Medication Dose Route Frequency Provider Last Rate Last Admin   acetaminophen  (TYLENOL ) tablet 650 mg  650 mg Oral Q6H PRN Patel, Ekta V, MD       Or   acetaminophen  (TYLENOL ) suppository 650 mg  650 mg Rectal Q6H PRN Patel, Ekta V, MD       acyclovir  (ZOVIRAX ) tablet 400 mg  400 mg Oral BID Ghimire, Kuber, MD   400 mg at 09/21/24 0839   aspirin  EC tablet 81 mg  81 mg Oral Daily Patel, Ekta V, MD   81 mg at 09/21/24 0841   atorvastatin  (LIPITOR) tablet 40 mg  40 mg Oral Daily Patel, Ekta V, MD   40 mg at 09/21/24 0841   buPROPion  (WELLBUTRIN  XL) 24 hr tablet 150 mg  150 mg Oral Daily Patel, Ekta V, MD   150 mg at 09/21/24 0841   calcium  carbonate (OS-CAL - dosed in mg of elemental calcium ) tablet 1,250 mg  1 tablet Oral TID WC Ghimire, Kuber, MD   1,250 mg at 09/21/24 0839   famotidine (PEPCID) tablet 20 mg  20 mg Oral BID Ghimire, Kuber, MD   20 mg at 09/21/24 0841   feeding supplement (ENSURE PLUS HIGH PROTEIN) liquid 237 mL  237 mL Oral BID BM Raenelle Coria, MD  237 mL at 09/21/24 0841   heparin  injection 5,000 Units  5,000 Units Subcutaneous Q8H Patel, Ekta V, MD   5,000 Units at 09/21/24 0617   hydrOXYzine (ATARAX) tablet 25 mg  25 mg Oral BID PRN Ghimire, Kuber, MD   25 mg at 09/20/24 2149   levothyroxine  (SYNTHROID ) tablet 50 mcg  50 mcg Oral QAC breakfast Tobie Mario GAILS, MD   50 mcg at 09/21/24 0840   lisinopril  (ZESTRIL ) tablet 10 mg  10 mg Oral Daily Patel, Ekta V, MD   10 mg at 09/21/24 0840   loperamide (IMODIUM) capsule 2 mg  2 mg Oral PRN Howerter, Justin B, DO   2 mg at 09/21/24 0109   metoCLOPramide  (REGLAN ) injection 5 mg  5 mg Intravenous Q8H PRN Patel, Ekta V, MD       metoprolol  tartrate (LOPRESSOR ) tablet 12.5 mg  12.5 mg Oral Daily PRN Patel, Ekta V, MD       sodium chloride  flush (NS) 0.9 % injection 3 mL  3 mL Intravenous Q12H Tobie Mario GAILS, MD   3 mL at 09/21/24 9157    REVIEW OF SYSTEMS  All other systems were reviewed and are negative     Objective:   Objective   There were no vitals filed for this visit. There is no height or weight on file to calculate BMI.  Physical Exam General: no acute distress Cardiac: hemodynamically stable Abdomen: non-tender, no pulsatile mass*** Extremities: no edema, cyanosis or wounds*** Vascular:   Right: ***  Left: ***  Data: ABI ***  Mesenteric duplex ***  BMP reviewed, creatinine 0.86  CT reviewed extensive aortic calcification in the infrarenal aorta.  Calcific disease at the ostium of the SMA and celiac.     Assessment/Plan:   Stephanie Daniel is a 73 y.o. female with ***  Recommendations to optimize cardiovascular risk: Abstinence from all tobacco products. Blood glucose control with goal A1c < 7%. Blood pressure control with goal blood pressure < 140/90 mmHg. Lipid reduction therapy with goal LDL-C <55 mg/dL  Aspirin  81mg  PO QD.  Atorvastatin  40-80mg  PO QD (or other high intensity statin therapy).   Norman GORMAN Serve MD [facility]

## 2024-09-22 DIAGNOSIS — H532 Diplopia: Secondary | ICD-10-CM | POA: Diagnosis not present

## 2024-09-22 DIAGNOSIS — R251 Tremor, unspecified: Secondary | ICD-10-CM | POA: Diagnosis not present

## 2024-09-22 DIAGNOSIS — E43 Unspecified severe protein-calorie malnutrition: Secondary | ICD-10-CM | POA: Diagnosis not present

## 2024-09-22 LAB — GASTROINTESTINAL PANEL BY PCR, STOOL (REPLACES STOOL CULTURE)

## 2024-09-22 LAB — BASIC METABOLIC PANEL WITH GFR
Anion gap: 7 (ref 5–15)
Anion gap: 10 (ref 5–15)
BUN: 10 mg/dL (ref 8–23)
BUN: 13 mg/dL (ref 8–23)
CO2: 23 mmol/L (ref 22–32)
CO2: 26 mmol/L (ref 22–32)
Calcium: 7.7 mg/dL — ABNORMAL LOW (ref 8.9–10.3)
Calcium: 8.6 mg/dL — ABNORMAL LOW (ref 8.9–10.3)
Chloride: 107 mmol/L (ref 98–111)
Chloride: 100 mmol/L (ref 98–111)
Creatinine, Ser: 0.96 mg/dL (ref 0.44–1.00)
Creatinine, Ser: 1.44 mg/dL — ABNORMAL HIGH (ref 0.44–1.00)
GFR, Estimated: >60 mL/min (ref >60)
GFR, Estimated: 38 mL/min — ABNORMAL LOW (ref >60)
Glucose, Bld: 72 mg/dL (ref 70–99)
Glucose, Bld: 105 mg/dL — ABNORMAL HIGH (ref 70–99)
Potassium: 6 mmol/L — ABNORMAL HIGH (ref 3.5–5.1)
Potassium: 5 mmol/L (ref 3.5–5.1)
Sodium: 137 mmol/L (ref 135–145)
Sodium: 136 mmol/L (ref 135–145)

## 2024-09-22 LAB — MAGNESIUM: Magnesium: 1.9 mg/dL (ref 1.7–2.4)

## 2024-09-22 MED ORDER — FUROSEMIDE 10 MG/ML IJ SOLN
40.0000 mg | Freq: Once | INTRAMUSCULAR | Status: AC
  Administered 2024-09-22: 40 mg via INTRAVENOUS
  Filled 2024-09-22: qty 4

## 2024-09-22 MED ORDER — SODIUM ZIRCONIUM CYCLOSILICATE 10 G PO PACK
10.0000 g | PACK | Freq: Every day | ORAL | Status: AC
  Administered 2024-09-22: 10 g via ORAL
  Filled 2024-09-22: qty 1

## 2024-09-22 NOTE — Progress Notes (Signed)
 PROGRESS NOTE        PATIENT DETAILS Name: Stephanie Daniel Age: 73 y.o. Sex: female Date of Birth: 02-13-1951 Admit Date: 09/19/2024 Admitting Physician Mario Tobie GAILS, MD PCP:[facility]  Brief Summary: Patient is a 73 y.o.  female with history of HTN, HLD, CAD, SVT-who presented to the ED with generalized weakness/tingling/numbness.  Apparently patient has had some intermittent diarrhea x 3 weeks, some intermittent vomiting for the past 1 month (recent EGD done at [facility] reportedly normal).  While in the emergency room-routine blood work demonstrated severe electrolyte derangement including severe hypomagnesemia (magnesium  <0.5)  Significant events: 11/3>> admit to [facility]  Significant studies: 10/01>> CT abdomen/pelvis: No acute abnormalities. 11/3>> CXR: No acute abnormalities. 11/3>> MRI brain: No acute intracranial abnormality 11/4>> CTA chest: No PE, small bilateral pleural effusions.  Significant microbiology data: 11/5>> GI pathogen panel: Ordered 11/5>> stool C. difficile studies: Ordered  Procedures: None  Consults: None  Subjective: Continues to improve-no vomiting x 48 hours-had 1 loose stool yesterday.  Per patient-stools are no longer watery-getting more formed but still loose.  Objective: Vitals: Blood pressure 116/72, pulse 72, temperature 98.3 F (36.8 C), temperature source Oral, resp. rate 15, height 5' 3 (1.6 m), weight 74.7 kg, SpO2 95%.   Exam: Gen Exam:Alert awake-not in any distress HEENT:atraumatic, normocephalic Chest: B/L clear to auscultation anteriorly CVS:S1S2 regular Abdomen:soft non tender, non distended Extremities:+ edema Neurology: Non focal Skin: no rash  Pertinent Labs/Radiology:    Latest Ref Rng & Units 09/20/2024    5:50 AM 09/19/2024   11:35 AM 07/25/2024    2:30 PM  CBC  WBC 4.0 - 10.5 K/uL 7.8  7.9  5.3   Hemoglobin 12.0 - 15.0 g/dL 86.8  87.6  86.6   Hematocrit 36.0 - 46.0  % 38.3  36.5  41.2   Platelets 150 - 400 K/uL 320  339  250     Lab Results  Component Value Date   NA 137 09/22/2024   K 6.0 (H) 09/22/2024   CL 107 09/22/2024   CO2 23 09/22/2024      Assessment/Plan: Severe hypomagnesemia Suspect secondary to chronic PPI use (on omeprazole at home)+ recent diarrhea/vomiting Magnesium  levels have now normalized with repletion-and resolution of nausea/vomiting/diarrhea.  Hypocalcemia Likely secondary to PTH being suppressed due to severe hypomagnesemia Suspect some of her symptoms including tremors/tingling/numbness were likely secondary to symptomatic hypocalcemia. Calcium  levels are improving after correcting magnesium  levels-thankfully clinically she is also doing well with resolution of tremors/tingling/numbness. PTH, 1, 25-dihydroxy vitamin D levels-stable.  Vomiting/diarrhea Ongoing for several weeks Recent EGD unremarkable (discussed with Dr. Burnette who reviewed outpatient records over the phone) Thankfully with just supportive care-vomiting has resolved Diarrhea is essentially resolved this morning-stool C. difficile/GI pathogen panel was negative. Dr. Burnette will send message to his office to arrange for outpatient colonoscopy (unexplained diarrhea with reported weight loss) Continue to mobilize with PT/OT  Hyperkalemia Perhaps to lisinopril  use Hold lisinopril  Lokelma ordered Lasix 40 mg x 1 ordered-for lower extremity edema Recheck electrolytes later this afternoon  Low extremity edema Likely secondary to hypoalbuminemia Send UA negative for proteinuria, echo on 10/20 stable EF. IV Lasix x 1 dose today  Hypothyroidism Synthroid  TSH stable  CAD No anginal symptoms Continue aspirin /statin  HTN BP stable Hold lisinopril  for now  GERD Has been appropriately switched from PPI  to Pepcid given severity of hypomagnesemia.  History of recurrent oral HSV ulcers On acyclovir  chronically  History of recurrent UTI On  suppressive Keflex-on hold-see above-awaiting stool studies  Nutrition Status: Nutrition Problem: Severe Malnutrition Etiology: chronic illness Signs/Symptoms: energy intake < 75% for > or equal to 1 month, severe muscle depletion, moderate fat depletion Interventions: Ensure Enlive (each supplement provides 350kcal and 20 grams of protein), MVI   Code status:   Code Status: Full Code   DVT Prophylaxis: heparin  injection 5,000 Units Start: 09/19/24 1615   Family Communication: None at bedside   Disposition Plan: Status is: Inpatient Remains inpatient appropriate because: Severity of illness   Planned Discharge Destination:Home health   Diet: Diet Order             Diet regular Room service appropriate? Yes; Fluid consistency: Thin  Diet effective now                     Antimicrobial agents: Anti-infectives (From admission, onward)    Start     Dose/Rate Route Frequency Ordered Stop   09/20/24 1000  acyclovir  (ZOVIRAX ) tablet 400 mg        400 mg Oral 2 times daily 09/20/24 0840          MEDICATIONS: Scheduled Meds:  acyclovir   400 mg Oral BID   aspirin  EC  81 mg Oral Daily   atorvastatin   40 mg Oral Daily   buPROPion   150 mg Oral Daily   calcium  carbonate  1 tablet Oral TID WC   famotidine  20 mg Oral BID   feeding supplement  237 mL Oral BID BM   heparin   5,000 Units Subcutaneous Q8H   levothyroxine   50 mcg Oral QAC breakfast   multivitamin with minerals  1 tablet Oral Daily   sodium chloride  flush  3 mL Intravenous Q12H   thiamine  100 mg Oral Daily   Continuous Infusions: PRN Meds:.acetaminophen  **OR** acetaminophen , hydrOXYzine, metoCLOPramide  (REGLAN ) injection, metoprolol  tartrate   I have personally reviewed following labs and imaging studies  LABORATORY DATA: CBC: Recent Labs  Lab 09/19/24 1135 09/20/24 0550  WBC 7.9 7.8  NEUTROABS 5.6  --   HGB 12.3 13.1  HCT 36.5 38.3  MCV 88.4 87.8  PLT 339 320    Basic Metabolic  Panel: Recent Labs  Lab 09/19/24 1135 09/19/24 1430 09/19/24 2211 09/20/24 0550 09/20/24 1848 09/21/24 0542 09/22/24 0301  NA 143  --   --  140 136 135 137  K 3.1*  --   --  2.9* 4.1 4.7 6.0*  CL 107  --   --  102 103 103 107  CO2 21*  --   --  23 21* 23 23  GLUCOSE 70  --   --  67* 121* 72 72  BUN 5*  --   --  5* 9 7* 10  CREATININE 0.94  --   --  0.95 1.16* 0.86 0.96  CALCIUM  5.6*  --    < > 6.1*  6.0* 6.7* 7.1* 7.7*  MG  --  <0.5*  --  1.6* 2.3 2.0 1.9  PHOS  --  4.9*  --   --   --   --   --    < > = values in this interval not displayed.    GFR: Estimated Creatinine Clearance: 49 mL/min (by C-G formula based on SCr of 0.96 mg/dL).  Liver Function Tests: Recent Labs  Lab 09/19/24 1135 09/20/24 0550  AST 23 22  ALT 13 13  ALKPHOS 58 66  BILITOT 1.0 1.0  PROT 4.8* 5.0*  ALBUMIN 2.2* 2.3*   No results for input(s): LIPASE, AMYLASE in the last 168 hours. No results for input(s): AMMONIA in the last 168 hours.  Coagulation Profile: Recent Labs  Lab 09/19/24 1135  INR 1.3*    Cardiac Enzymes: No results for input(s): CKTOTAL, CKMB, CKMBINDEX, TROPONINI in the last 168 hours.  BNP (last 3 results) Recent Labs    07/27/24 1435  PROBNP 212    Lipid Profile: No results for input(s): CHOL, HDL, LDLCALC, TRIG, CHOLHDL, LDLDIRECT in the last 72 hours.  Thyroid  Function Tests: Recent Labs    09/19/24 1430 09/19/24 1525  TSH  --  2.809  FREET4 1.06  --     Anemia Panel: Recent Labs    09/19/24 2210 09/20/24 0500  VITAMINB12 403  --   FOLATE  --  14.9    Urine analysis:    Component Value Date/Time   COLORURINE YELLOW 09/19/2024 0600   APPEARANCEUR CLEAR 09/19/2024 0600   LABSPEC 1.041 (H) 09/19/2024 0600   PHURINE 6.0 09/19/2024 0600   GLUCOSEU NEGATIVE 09/19/2024 0600   HGBUR NEGATIVE 09/19/2024 0600   BILIRUBINUR NEGATIVE 09/19/2024 0600   KETONESUR 5 (A) 09/19/2024 0600   PROTEINUR NEGATIVE 09/19/2024 0600    NITRITE NEGATIVE 09/19/2024 0600   LEUKOCYTESUR MODERATE (A) 09/19/2024 0600    Sepsis Labs: Lactic Acid, Venous No results found for: LATICACIDVEN  MICROBIOLOGY: Recent Results (from the past 240 hours)  Gastrointestinal Panel by PCR , Stool     Status: None   Collection Time: 09/21/24  6:32 PM   Specimen: STOOL  Result Value Ref Range Status   Campylobacter species NOT DETECTED NOT DETECTED Final   Plesimonas shigelloides NOT DETECTED NOT DETECTED Final   Salmonella species NOT DETECTED NOT DETECTED Final   Yersinia enterocolitica NOT DETECTED NOT DETECTED Final   Vibrio species NOT DETECTED NOT DETECTED Final   Vibrio cholerae NOT DETECTED NOT DETECTED Final   Enteroaggregative E coli (EAEC) NOT DETECTED NOT DETECTED Final   Enteropathogenic E coli (EPEC) NOT DETECTED NOT DETECTED Final   Enterotoxigenic E coli (ETEC) NOT DETECTED NOT DETECTED Final   Shiga like toxin producing E coli (STEC) NOT DETECTED NOT DETECTED Final   Shigella/Enteroinvasive E coli (EIEC) NOT DETECTED NOT DETECTED Final   Cryptosporidium NOT DETECTED NOT DETECTED Final   Cyclospora cayetanensis NOT DETECTED NOT DETECTED Final   Entamoeba histolytica NOT DETECTED NOT DETECTED Final   Giardia lamblia NOT DETECTED NOT DETECTED Final   Adenovirus F40/41 NOT DETECTED NOT DETECTED Final   Astrovirus NOT DETECTED NOT DETECTED Final   Norovirus GI/GII NOT DETECTED NOT DETECTED Final   Rotavirus A NOT DETECTED NOT DETECTED Final   Sapovirus (I, II, IV, and V) NOT DETECTED NOT DETECTED Final    Comment: Performed at [facility], [address] [place], [place] [address]  C Difficile Quick Screen w PCR reflex     Status: None   Collection Time: 09/21/24  6:32 PM   Specimen: STOOL  Result Value Ref Range Status   C Diff antigen NEGATIVE NEGATIVE Final   C Diff toxin NEGATIVE NEGATIVE Final   C Diff interpretation No C. difficile detected.  Final    Comment: Performed at [facility],  [address], [place], [place] [address]    RADIOLOGY STUDIES/RESULTS: No results found.    LOS: 3 days  Donalda Applebaum, MD  [facility]    To contact the attending provider between 7A-7P or the covering provider during after hours 7P-7A, please log into the web site [email or web address] and access using universal [place] password for that web site. If you do not have the password, please call the hospital operator.  09/22/2024, 11:34 AM

## 2024-09-22 NOTE — Plan of Care (Signed)

## 2024-09-22 NOTE — Progress Notes (Signed)
   09/22/24 0940  Spiritual Encounters  Type of Visit Initial  Care provided to: Patient  Reason for visit Advance directives  Advance Directives (For Healthcare)  Does Patient Have a Medical Advance Directive? No  Would patient like information on creating a medical advance directive? Yes (Inpatient - patient defers creating a medical advance directive at this time - Information given)   Chaplain responded to consult for AD. Education provided to [employer] who said she knows that it is important. Paperwork left at bedside.

## 2024-09-23 ENCOUNTER — Ambulatory Visit: Admitting: Vascular Surgery

## 2024-09-23 ENCOUNTER — Ambulatory Visit (HOSPITAL_COMMUNITY)

## 2024-09-23 ENCOUNTER — Other Ambulatory Visit (HOSPITAL_COMMUNITY): Payer: Self-pay

## 2024-09-23 ENCOUNTER — Ambulatory Visit (HOSPITAL_COMMUNITY): Admission: RE | Admit: 2024-09-23 | Source: Ambulatory Visit

## 2024-09-23 DIAGNOSIS — H532 Diplopia: Secondary | ICD-10-CM | POA: Diagnosis not present

## 2024-09-23 DIAGNOSIS — E43 Unspecified severe protein-calorie malnutrition: Secondary | ICD-10-CM | POA: Diagnosis not present

## 2024-09-23 DIAGNOSIS — R251 Tremor, unspecified: Secondary | ICD-10-CM | POA: Diagnosis not present

## 2024-09-23 LAB — BASIC METABOLIC PANEL WITH GFR
Anion gap: 11 (ref 5–15)
BUN: 11 mg/dL (ref 8–23)
CO2: 25 mmol/L (ref 22–32)
Calcium: 8.7 mg/dL — ABNORMAL LOW (ref 8.9–10.3)
Chloride: 97 mmol/L — ABNORMAL LOW (ref 98–111)
Creatinine, Ser: 1.28 mg/dL — ABNORMAL HIGH (ref 0.44–1.00)
GFR, Estimated: 44 mL/min — ABNORMAL LOW (ref >60)
Glucose, Bld: 90 mg/dL (ref 70–99)
Potassium: 4.6 mmol/L (ref 3.5–5.1)
Sodium: 133 mmol/L — ABNORMAL LOW (ref 135–145)

## 2024-09-23 MED ORDER — METOPROLOL TARTRATE 25 MG PO TABS
25.0000 mg | ORAL_TABLET | Freq: Two times a day (BID) | ORAL | 1 refills | Status: AC
  Filled 2024-09-23: qty 60, 30d supply, fill #0

## 2024-09-23 MED ORDER — FAMOTIDINE 20 MG PO TABS
20.0000 mg | ORAL_TABLET | Freq: Every day | ORAL | 1 refills | Status: AC
  Filled 2024-09-23: qty 30, 30d supply, fill #0

## 2024-09-23 NOTE — Discharge Summary (Signed)
 PATIENT DETAILS Name: Stephanie Daniel Age: 73 y.o. Sex: female Date of Birth: 06-30-1951 MRN: [id number]. Admitting Physician: Mario Tobie GAILS, MD PCP:[facility], [facility]  Admit Date: 09/19/2024 Discharge date: 09/23/2024  Recommendations for Outpatient Follow-up:  Follow up with PCP in 1-2 weeks Please obtain CMP/CBC in one week Ensure follow-up with [facility] gastroenterology-needs outpatient colonoscopy  Admitted From:  Home  Disposition: Home   Discharge Condition: good  CODE STATUS:   Code Status: Full Code   Diet recommendation:  Diet Order             Diet - low sodium heart healthy           Diet regular Room service appropriate? Yes; Fluid consistency: Thin  Diet effective now                    Brief Summary: Patient is a 73 y.o.  female with history of HTN, HLD, CAD, SVT-who presented to the ED with generalized weakness/tingling/numbness.  Apparently patient has had some intermittent diarrhea x 3 weeks, some intermittent vomiting for the past 1 month (recent EGD done at [facility] reportedly normal).  While in the emergency room-routine blood work demonstrated severe electrolyte derangement including severe hypomagnesemia (magnesium  <0.5)   Significant events: 11/3>> admit to [facility]   Significant studies: 10/01>> CT abdomen/pelvis: No acute abnormalities. 11/3>> CXR: No acute abnormalities. 11/3>> MRI brain: No acute intracranial abnormality 11/4>> CTA chest: No PE, small bilateral pleural effusions.   Significant microbiology data: 11/5>> GI pathogen panel: Ordered 11/5>> stool C. difficile studies: Ordered   Procedures: None   Consults: None   Brief Hospital Course: Severe hypomagnesemia Suspect secondary to chronic PPI use (on omeprazole at home)+ recent diarrhea/vomiting Magnesium  levels have now normalized with repletion-and resolution of nausea/vomiting/diarrhea.   Hypocalcemia Likely secondary to PTH being suppressed due to  severe hypomagnesemia Suspect some of her symptoms including tremors/tingling/numbness were likely secondary to symptomatic hypocalcemia. Calcium  levels are improving after correcting magnesium  levels-thankfully clinically she is also doing well with resolution of tremors/tingling/numbness. PTH, 1, 25-dihydroxy vitamin D levels-stable.   Vomiting/diarrhea Ongoing for several weeks prior to hospitalization Recent EGD unremarkable (discussed with Dr. Burnette who reviewed outpatient records over the phone) Thankfully with just supportive care-vomiting has resolved Diarrhea is also resolved-stool GI pathogen panel-C. difficile studies were all negative. Discussed with [facility] gastroenterology on-call-on 11/6-Dr. Burnette will send message to his office to arrange for outpatient colonoscopy (unexplained diarrhea with reported weight loss)   Hyperkalemia Secondary to lisinopril  use-held-treated with Lokelma/Lasix-potassium levels have since normalized.   Low extremity edema Likely secondary to hypoalbuminemia Recent UA negative for proteinuria, echo on 10/20 stable EF. Given a dose of IV Lasix on 11/6 with some improvement in leg edema-PCP to reassess volume status on follow-up in start diuretics accordingly.   Hypothyroidism Synthroid  TSH stable   CAD No anginal symptoms Continue aspirin /statin   HTN BP stable but creeping up Hold lisinopril  for now-given hyperkalemia on 11/6 She apparently takes as needed metoprolol  at home-Will switch to scheduled dosing and have her follow-up with PCP.   GERD Has been appropriately switched from PPI to Pepcid given severity of hypomagnesemia.   History of recurrent oral HSV ulcers On acyclovir  chronically   History of recurrent UTI On suppressive Keflex   Nutrition Status: Nutrition Problem: Severe Malnutrition Etiology: chronic illness Signs/Symptoms: energy intake < 75% for > or equal to 1 month, severe muscle depletion, moderate fat  depletion Interventions: Ensure Enlive (each  supplement provides 350kcal and 20 grams of protein), MVI   Discharge Diagnoses:  Principal Problem:   Tremor Active Problems:   Protein-calorie malnutrition, severe   Discharge Instructions:  Activity:  As tolerated with Full fall precautions use walker/cane & assistance as needed   Discharge Instructions     Call MD for:  persistant dizziness or light-headedness   Complete by: As directed    Call MD for:  persistant nausea and vomiting   Complete by: As directed    Call MD for:  severe uncontrolled pain   Complete by: As directed    Diet - low sodium heart healthy   Complete by: As directed    Discharge instructions   Complete by: As directed    Follow with Primary MD  [facility], [facility] in 1-2 weeks  Please get a complete blood count and chemistry panel checked by your Primary MD at your next visit, and again as instructed by your Primary MD.  Get Medicines reviewed and adjusted: Please take all your medications with you for your next visit with your Primary MD  Laboratory/radiological data: Please request your Primary MD to go over all hospital tests and procedure/radiological results at the follow up, please ask your Primary MD to get all Hospital records sent to his/her office.  In some cases, they will be blood work, cultures and biopsy results pending at the time of your discharge. Please request that your primary care M.D. follows up on these results.  Also Note the following: If you experience worsening of your admission symptoms, develop shortness of breath, life threatening emergency, suicidal or homicidal thoughts you must seek medical attention immediately by calling 911 or calling your MD immediately  if symptoms less severe.  You must read complete instructions/literature along with all the possible adverse reactions/side effects for all the Medicines you take and that have been prescribed to you.  Take any new Medicines after you have completely understood and accpet all the possible adverse reactions/side effects.   Do not drive when taking Pain medications or sleeping medications (Benzodaizepines)  Do not take more than prescribed Pain, Sleep and Anxiety Medications. It is not advisable to combine anxiety,sleep and pain medications without talking with your primary care practitioner  Special Instructions: If you have smoked or chewed Tobacco  in the last 2 yrs please stop smoking, stop any regular Alcohol  and or any Recreational drug use.  Wear Seat belts while driving.  Please note: You were cared for by a hospitalist during your hospital stay. Once you are discharged, your primary care physician will handle any further medical issues. Please note that NO REFILLS for any discharge medications will be authorized once you are discharged, as it is imperative that you return to your primary care physician (or establish a relationship with a primary care physician if you do not have one) for your post hospital discharge needs so that they can reassess your need for medications and monitor your lab values.   Increase activity slowly   Complete by: As directed       Allergies as of 09/23/2024       Reactions   Albuterol Other (See Comments)   Respiratory distress   Oxycodone  Nausea And Vomiting        Medication List     STOP taking these medications    lisinopril  20 MG tablet Commonly known as: ZESTRIL        TAKE these medications    acyclovir  400  MG tablet Commonly known as: ZOVIRAX  Take 400 mg by mouth 2 (two) times daily.   aspirin  EC 81 MG tablet Take 81 mg by mouth daily. Swallow whole.   atorvastatin  40 MG tablet Commonly known as: LIPITOR TAKE 1 TABLET(40 MG) BY MOUTH DAILY   buPROPion  150 MG 24 hr tablet Commonly known as: WELLBUTRIN  XL Take 150 mg by mouth daily.   cephALEXin 500 MG capsule Commonly known as: KEFLEX Take 500 mg by mouth daily.  Continuous course for UTI prophylaxis   Dupixent 300 MG/2ML Soaj Generic drug: Dupilumab   famotidine 20 MG tablet Commonly known as: Pepcid Take 1 tablet (20 mg total) by mouth daily.   gabapentin  300 MG capsule Commonly known as: NEURONTIN  Take 300 mg by mouth at bedtime.   hydrOXYzine 25 MG tablet Commonly known as: ATARAX Take 25 mg by mouth 2 (two) times daily as needed.   levocetirizine 5 MG tablet Commonly known as: XYZAL Take 5 mg by mouth every evening.   levothyroxine  50 MCG tablet Commonly known as: SYNTHROID  Take 50 mcg by mouth daily before breakfast.   metoprolol  tartrate 25 MG tablet Commonly known as: LOPRESSOR  Take 1 tablet (25 mg total) by mouth 2 (two) times daily. What changed:  how much to take when to take this reasons to take this   nitroGLYCERIN  0.4 MG SL tablet Commonly known as: NITROSTAT  Place 1 tablet (0.4 mg total) under the tongue every 5 (five) minutes x 3 doses as needed for chest pain.   ondansetron  4 MG tablet Commonly known as: ZOFRAN  Take 4 mg by mouth 2 (two) times daily.   Potassium 99 MG Tabs Take 198 mg by mouth at bedtime.        Allergies  Allergen Reactions   Albuterol Other (See Comments)    Respiratory distress   Oxycodone  Nausea And Vomiting     Other Procedures/Studies: CT Angio Chest PE W and/or Wo Contrast Result Date: 09/20/2024 EXAM: CTA of the Chest with contrast for PE 09/20/2024 12:38:07 AM TECHNIQUE: CTA of the chest was performed after the administration of 75 mL of iohexol  (OMNIPAQUE ) 350 MG/ML injection. Multiplanar reformatted images are provided for review. MIP images are provided for review. Automated exposure control, iterative reconstruction, and/or weight based adjustment of the mA/kV was utilized to reduce the radiation dose to as low as reasonably achievable. COMPARISON: 07/29/2021 CLINICAL HISTORY: Pulmonary embolism (PE) suspected, high prob. FINDINGS: PULMONARY ARTERIES: Pulmonary arteries  are adequately opacified for evaluation. No pulmonary embolism. Main pulmonary artery is normal in caliber. MEDIASTINUM: The heart and pericardium demonstrate no acute abnormality. Diffuse coronary artery and aortic atherosclerosis. LYMPH NODES: No mediastinal, hilar or axillary lymphadenopathy. LUNGS AND PLEURA: Small bilateral pleural effusions. Thin atelectasis in the lower lobes. No pneumothorax. UPPER ABDOMEN: Limited images of the upper abdomen are unremarkable. SOFT TISSUES AND BONES: Bilateral breast implants grossly unremarkable. No acute bone or soft tissue abnormality. IMPRESSION: 1. No pulmonary embolism. 2. Small bilateral pleural effusions with associated lower lobe atelectasis. 3. Diffuse coronary artery disease, aortic atherosclerosis. Electronically signed by: Franky Crease MD 09/20/2024 01:00 AM EST RP Workstation: [id number]   MR BRAIN WO CONTRAST Result Date: 09/19/2024 EXAM: MRI BRAIN WITHOUT CONTRAST 09/19/2024 01:50:00 PM TECHNIQUE: Multiplanar multisequence MRI of the head/brain was performed without the administration of intravenous contrast. COMPARISON: CT head from today. CLINICAL HISTORY: Neuro deficit, acute, stroke suspected FINDINGS: BRAIN AND VENTRICLES: No acute infarct. Small remote left parietal cortical infarct. Mild for age T2/FLAIR hyperintensity in the white  matter, compatible with coronary microvascular ischemic changes. No intracranial hemorrhage. No mass. No midline shift. No hydrocephalus. Normal flow voids. ORBITS: No acute abnormality. SINUSES AND MASTOIDS: Clear sinuses. Trace right mastoid effusion. BONES AND SOFT TISSUES: Normal marrow signal. No acute soft tissue abnormality. IMPRESSION: 1. No acute intracranial abnormality. Electronically signed by: Gilmore Molt MD 09/19/2024 03:00 PM EST RP Workstation: [id number]   DG Chest Port 1 View Result Date: 09/19/2024 EXAM: 1 VIEW(S) XRAY OF THE CHEST 09/19/2024 11:54:00 AM COMPARISON: 07/25/2024 CLINICAL HISTORY:  SOB FINDINGS: LUNGS AND PLEURA: No focal pulmonary opacity. No pulmonary edema. Small right pleural effusion, new since prior. No pneumothorax. HEART AND MEDIASTINUM: Cardiomegaly. Calcified aorta. BONES AND SOFT TISSUES: No acute osseous abnormality. IMPRESSION: 1. Small right pleural effusion, new since prior. 2. Cardiomegaly and calcified aorta. Electronically signed by: Evalene Coho MD 09/19/2024 12:19 PM EST RP Workstation: [id number]   CT HEAD WO CONTRAST Result Date: 09/19/2024 EXAM: CT HEAD WITHOUT CONTRAST 09/19/2024 12:04:00 PM TECHNIQUE: CT of the head was performed without the administration of intravenous contrast. Automated exposure control, iterative reconstruction, and/or weight based adjustment of the mA/kV was utilized to reduce the radiation dose to as low as reasonably achievable. COMPARISON: Maxillofacial CT dated 12/31/2022. CLINICAL HISTORY: Neuro deficit, acute, stroke suspected. FINDINGS: BRAIN AND VENTRICLES: No acute hemorrhage. No evidence of acute infarct. No hydrocephalus. No extra-axial collection. No mass effect or midline shift. Atherosclerotic calcifications within the cavernous internal carotid arteries. ORBITS: Bilateral lens replacement. SINUSES: Status post facial reconstruction surgery. Interval near complete resolution of left maxillary sinus disease. SOFT TISSUES AND SKULL: No acute soft tissue abnormality. No skull fracture. IMPRESSION: 1. No acute intracranial abnormality. Electronically signed by: Evalene Coho MD 09/19/2024 12:19 PM EST RP Workstation: [id number]   ECHOCARDIOGRAM COMPLETE Result Date: 09/05/2024    ECHOCARDIOGRAM REPORT   Patient Name:   CLAIRE DOLORES Date of Exam: 09/05/2024 Medical Rec #:  [id number]      Height:       63.0 in Accession #:    [id number]     Weight:       157.9 lb Date of Birth:  1951-10-21      BSA:          1.749 m Patient Age:    73 years       BP:           128/56 mmHg Patient Gender: F              HR:           81  bpm. Exam Location:  [address] Procedure: 2D Echo, Cardiac Doppler and Color Doppler (Both Spectral and Color            Flow Doppler were utilized during procedure). Indications:    R06.02 Shortness of breath  History:        Patient has prior history of Echocardiogram examinations, most                 recent 07/18/2022. CAD; Risk Factors:Hypertension.  Sonographer:    Carl Coma RDCS Referring Phys: [id number] DARRYLE NED O'NEAL IMPRESSIONS  1. Left ventricular ejection fraction, by estimation, is 60 to 65%. The left ventricle has normal function. The left ventricle has no regional wall motion abnormalities. Left ventricular diastolic parameters are consistent with Grade I diastolic dysfunction (impaired relaxation).  2. Right ventricular systolic function is normal. The right ventricular size is normal. Tricuspid regurgitation signal is inadequate for assessing PA pressure.  3.  The mitral valve is normal in structure. Mild mitral valve regurgitation. No evidence of mitral stenosis.  4. The aortic valve is tricuspid. Aortic valve regurgitation is not visualized. Aortic valve sclerosis/calcification is present, without any evidence of aortic stenosis.  5. The inferior vena cava is normal in size with greater than 50% respiratory variability, suggesting right atrial pressure of 3 mmHg. FINDINGS  Left Ventricle: Left ventricular ejection fraction, by estimation, is 60 to 65%. The left ventricle has normal function. The left ventricle has no regional wall motion abnormalities. The left ventricular internal cavity size was normal in size. There is  no left ventricular hypertrophy. Left ventricular diastolic parameters are consistent with Grade I diastolic dysfunction (impaired relaxation). Normal left ventricular filling pressure. Right Ventricle: The right ventricular size is normal. No increase in right ventricular wall thickness. Right ventricular systolic function is normal. Tricuspid regurgitation  signal is inadequate for assessing PA pressure. Left Atrium: Left atrial size was normal in size. Right Atrium: Right atrial size was normal in size. Pericardium: There is no evidence of pericardial effusion. Mitral Valve: The mitral valve is normal in structure. Mild mitral valve regurgitation. No evidence of mitral valve stenosis. Tricuspid Valve: The tricuspid valve is normal in structure. Tricuspid valve regurgitation is trivial. No evidence of tricuspid stenosis. Aortic Valve: The aortic valve is tricuspid. Aortic valve regurgitation is not visualized. Aortic valve sclerosis/calcification is present, without any evidence of aortic stenosis. Pulmonic Valve: The pulmonic valve was normal in structure. Pulmonic valve regurgitation is not visualized. No evidence of pulmonic stenosis. Aorta: The aortic root is normal in size and structure. Venous: The inferior vena cava is normal in size with greater than 50% respiratory variability, suggesting right atrial pressure of 3 mmHg. IAS/Shunts: The interatrial septum appears to be lipomatous. No atrial level shunt detected by color flow Doppler.  LEFT VENTRICLE PLAX 2D LVIDd:         4.10 cm   Diastology LVIDs:         2.70 cm   LV e' medial:    5.44 cm/s LV PW:         0.80 cm   LV E/e' medial:  9.6 LV IVS:        0.50 cm   LV e' lateral:   7.29 cm/s LVOT diam:     2.00 cm   LV E/e' lateral: 7.1 LV SV:         42 LV SV Index:   24 LVOT Area:     3.14 cm  RIGHT VENTRICLE             IVC RV Basal diam:  3.70 cm     IVC diam: 1.10 cm RV S prime:     16.53 cm/s TAPSE (M-mode): 1.9 cm LEFT ATRIUM             Index        RIGHT ATRIUM           Index LA diam:        3.60 cm 2.06 cm/m   RA Area:     10.70 cm LA Vol (A2C):   32.2 ml 18.41 ml/m  RA Volume:   25.00 ml  14.30 ml/m LA Vol (A4C):   25.9 ml 14.81 ml/m LA Biplane Vol: 29.2 ml 16.70 ml/m  AORTIC VALVE LVOT Vmax:   73.53 cm/s LVOT Vmean:  46.800 cm/s LVOT VTI:    0.134 m  AORTA Ao Root diam: 3.20 cm Ao Asc diam:  3.70 cm MITRAL VALVE MV Area (PHT): 3.39 cm    SHUNTS MV Decel Time: 224 msec    Systemic VTI:  0.13 m MV E velocity: 52.10 cm/s  Systemic Diam: 2.00 cm MV A velocity: 74.15 cm/s MV E/A ratio:  0.70 Wilbert Bihari MD Electronically signed by Wilbert Bihari MD Signature Date/Time: 09/05/2024/12:50:12 PM    Final      TODAY-DAY OF DISCHARGE:  Subjective:   Reena Saver today has no headache,no chest abdominal pain,no new weakness tingling or numbness, feels much better wants to go home today.   Objective:   Blood pressure (!) 159/78, pulse 82, temperature 97.9 F (36.6 C), temperature source Oral, resp. rate 18, height 5' 3 (1.6 m), weight 70.9 kg, SpO2 96%.  Intake/Output Summary (Last 24 hours) at 09/23/2024 0934 Last data filed at 09/23/2024 0600 Gross per 24 hour  Intake --  Output 700 ml  Net -700 ml   Filed Weights   09/21/24 0512 09/22/24 0514 09/23/24 0500  Weight: 76.1 kg 74.7 kg 70.9 kg    Exam: Awake Alert, Oriented *3, No new F.N deficits, Normal affect [place].AT,PERRAL Supple Neck,No JVD, No cervical lymphadenopathy appriciated.  Symmetrical Chest wall movement, Good air movement bilaterally, CTAB RRR,No Gallops,Rubs or new Murmurs, No Parasternal Heave +ve B.Sounds, Abd Soft, Non tender, No organomegaly appriciated, No rebound -guarding or rigidity. No Cyanosis, Clubbing or edema, No new Rash or bruise   PERTINENT RADIOLOGIC STUDIES: No results found.   PERTINENT LAB RESULTS: CBC: No results for input(s): WBC, HGB, HCT, PLT in the last 72 hours. CMET CMP     Component Value Date/Time   NA 133 (L) 09/23/2024 0352   NA 137 12/25/2022 1138   K 4.6 09/23/2024 0352   CL 97 (L) 09/23/2024 0352   CO2 25 09/23/2024 0352   GLUCOSE 90 09/23/2024 0352   BUN 11 09/23/2024 0352   BUN 14 12/25/2022 1138   CREATININE 1.28 (H) 09/23/2024 0352   CALCIUM  8.7 (L) 09/23/2024 0352   CALCIUM  6.0 (LL) 09/20/2024 0550   PROT 5.0 (L) 09/20/2024 0550   PROT 6.4  09/10/2021 1018   ALBUMIN 2.3 (L) 09/20/2024 0550   ALBUMIN 4.4 09/10/2021 1018   AST 22 09/20/2024 0550   ALT 13 09/20/2024 0550   ALKPHOS 66 09/20/2024 0550   BILITOT 1.0 09/20/2024 0550   BILITOT 0.4 09/10/2021 1018   EGFR 56.0 02/15/2024 1523   EGFR 52 (L) 12/25/2022 1138   GFRNONAA 44 (L) 09/23/2024 0352    GFR Estimated Creatinine Clearance: 35.9 mL/min (A) (by C-G formula based on SCr of 1.28 mg/dL (H)). No results for input(s): LIPASE, AMYLASE in the last 72 hours. No results for input(s): CKTOTAL, CKMB, CKMBINDEX, TROPONINI in the last 72 hours. Invalid input(s): POCBNP No results for input(s): DDIMER in the last 72 hours. No results for input(s): HGBA1C in the last 72 hours. No results for input(s): CHOL, HDL, LDLCALC, TRIG, CHOLHDL, LDLDIRECT in the last 72 hours. No results for input(s): TSH, T4TOTAL, T3FREE, THYROIDAB in the last 72 hours.  Invalid input(s): FREET3 No results for input(s): VITAMINB12, FOLATE, FERRITIN, TIBC, IRON, RETICCTPCT in the last 72 hours. Coags: No results for input(s): INR in the last 72 hours.  Invalid input(s): PT Microbiology: Recent Results (from the past 240 hours)  Gastrointestinal Panel by PCR , Stool     Status: None   Collection Time: 09/21/24  6:32 PM   Specimen: STOOL  Result Value Ref Range Status   Campylobacter species NOT  DETECTED NOT DETECTED Final   Plesimonas shigelloides NOT DETECTED NOT DETECTED Final   Salmonella species NOT DETECTED NOT DETECTED Final   Yersinia enterocolitica NOT DETECTED NOT DETECTED Final   Vibrio species NOT DETECTED NOT DETECTED Final   Vibrio cholerae NOT DETECTED NOT DETECTED Final   Enteroaggregative E coli (EAEC) NOT DETECTED NOT DETECTED Final   Enteropathogenic E coli (EPEC) NOT DETECTED NOT DETECTED Final   Enterotoxigenic E coli (ETEC) NOT DETECTED NOT DETECTED Final   Shiga like toxin producing E coli (STEC) NOT DETECTED NOT  DETECTED Final   Shigella/Enteroinvasive E coli (EIEC) NOT DETECTED NOT DETECTED Final   Cryptosporidium NOT DETECTED NOT DETECTED Final   Cyclospora cayetanensis NOT DETECTED NOT DETECTED Final   Entamoeba histolytica NOT DETECTED NOT DETECTED Final   Giardia lamblia NOT DETECTED NOT DETECTED Final   Adenovirus F40/41 NOT DETECTED NOT DETECTED Final   Astrovirus NOT DETECTED NOT DETECTED Final   Norovirus GI/GII NOT DETECTED NOT DETECTED Final   Rotavirus A NOT DETECTED NOT DETECTED Final   Sapovirus (I, II, IV, and V) NOT DETECTED NOT DETECTED Final    Comment: Performed at [facility], [address] [place], [place] [address]  C Difficile Quick Screen w PCR reflex     Status: None   Collection Time: 09/21/24  6:32 PM   Specimen: STOOL  Result Value Ref Range Status   C Diff antigen NEGATIVE NEGATIVE Final   C Diff toxin NEGATIVE NEGATIVE Final   C Diff interpretation No C. difficile detected.  Final    Comment: Performed at [facility], [address] [place], [place] [address]    FURTHER DISCHARGE INSTRUCTIONS:  Get Medicines reviewed and adjusted: Please take all your medications with you for your next visit with your Primary MD  Laboratory/radiological data: Please request your Primary MD to go over all hospital tests and procedure/radiological results at the follow up, please ask your Primary MD to get all Hospital records sent to his/her office.  In some cases, they will be blood work, cultures and biopsy results pending at the time of your discharge. Please request that your primary care M.D. goes through all the records of your hospital data and follows up on these results.  Also Note the following: If you experience worsening of your admission symptoms, develop shortness of breath, life threatening emergency, suicidal or homicidal thoughts you must seek medical attention immediately by calling 911 or calling your MD immediately  if symptoms less  severe.  You must read complete instructions/literature along with all the possible adverse reactions/side effects for all the Medicines you take and that have been prescribed to you. Take any new Medicines after you have completely understood and accpet all the possible adverse reactions/side effects.   Do not drive when taking Pain medications or sleeping medications (Benzodaizepines)  Do not take more than prescribed Pain, Sleep and Anxiety Medications. It is not advisable to combine anxiety,sleep and pain medications without talking with your primary care practitioner  Special Instructions: If you have smoked or chewed Tobacco  in the last 2 yrs please stop smoking, stop any regular Alcohol  and or any Recreational drug use.  Wear Seat belts while driving.  Please note: You were cared for by a hospitalist during your hospital stay. Once you are discharged, your primary care physician will handle any further medical issues. Please note that NO REFILLS for any discharge medications will be authorized once you are discharged, as it is  imperative that you return to your primary care physician (or establish a relationship with a primary care physician if you do not have one) for your post hospital discharge needs so that they can reassess your need for medications and monitor your lab values.  Total Time spent coordinating discharge including counseling, education and face to face time equals greater than 30 minutes.  SignedBETHA Donalda Applebaum 09/23/2024 9:34 AM

## 2024-09-23 NOTE — Plan of Care (Signed)

## 2024-09-23 NOTE — Plan of Care (Signed)

## 2024-09-23 NOTE — Progress Notes (Signed)
 Physical Therapy Treatment Patient Details Name: Stephanie Daniel MRN: [id number] DOB: Feb 02, 1951 Today's Date: 09/23/2024   History of Present Illness Pt is a 73 y.o. female who presented 09/19/24 with weakness, numbness, and tingling in all 4 extremities, worsening baseline tremors, diplopia, and vertigo. She has had a poor appetite for ~6 weeks. Pt admitted with severe symptomatic hypocalcemia, severe hypomagnesemia, and hypokalemia. PMH: CAD, SVT, HTN, HLD, GERD, hypothyroidism    PT Comments  The pt continues to display deficits in balance, but was often able to recover on her own with reactional strategies, only needing CGA for safety when ambulating without an AD. Continued to encourage use of rollator at d/c until balance improves and educated pt on progressing mobility via a walking program, serial sit <> stands, and standing exercises with UE support. She verbalized understanding.    If plan is discharge home, recommend the following: Assistance with cooking/housework;Assist for transportation   Can travel by private vehicle        Equipment Recommendations  None recommended by PT    Recommendations for Other Services       Precautions / Restrictions Precautions Precautions: Fall Recall of Precautions/Restrictions: Intact Restrictions Weight Bearing Restrictions Per Provider Order: No     Mobility  Bed Mobility Overal bed mobility: Modified Independent             General bed mobility comments: No assistance needed, HOB elevated    Transfers Overall transfer level: Needs assistance Equipment used: None Transfers: Sit to/from Stand Sit to Stand: Supervision           General transfer comment: No LOB standing from EOB 1x to no AD, supervision for safety    Ambulation/Gait Ambulation/Gait assistance: Contact guard assist Gait Distance (Feet): 160 Feet Assistive device: None Gait Pattern/deviations: Step-through pattern, Decreased stride length, Drifts  right/left Gait velocity: reduced Gait velocity interpretation: <1.8 ft/sec, indicate of risk for recurrent falls   General Gait Details: Mild lateral swaying intermittently, but seemed to recover with reactional strategies with CGA for safety.   Stairs             Wheelchair Mobility     Tilt Bed    Modified Rankin (Stroke Patients Only)       Balance Overall balance assessment: Mild deficits observed, not formally tested                                          Communication Communication Communication: No apparent difficulties  Cognition Arousal: Alert Behavior During Therapy: WFL for tasks assessed/performed   PT - Cognitive impairments: No apparent impairments                         Following commands: Intact      Cueing Cueing Techniques: Verbal cues  Exercises      General Comments General comments (skin integrity, edema, etc.): Continued to encourage use of rollator at d/c until balance improves and educated pt on progressing mobility via a walking program, serial sit <> stands, and standing exercises with UE support. She verbalized understanding.      Pertinent Vitals/Pain Pain Assessment Pain Assessment: Faces Faces Pain Scale: No hurt Pain Intervention(s): Monitored during session    Home Living  Prior Function            PT Goals (current goals can now be found in the care plan section) Acute Rehab PT Goals Patient Stated Goal: to improve PT Goal Formulation: With patient Time For Goal Achievement: 10/05/24 Potential to Achieve Goals: Good Progress towards PT goals: Progressing toward goals    Frequency    Min 1X/week      PT Plan      Co-evaluation              AM-PAC PT 6 Clicks Mobility   Outcome Measure  Help needed turning from your back to your side while in a flat bed without using bedrails?: None Help needed moving from lying on your back to  sitting on the side of a flat bed without using bedrails?: None Help needed moving to and from a bed to a chair (including a wheelchair)?: A Little Help needed standing up from a chair using your arms (e.g., wheelchair or bedside chair)?: A Little Help needed to walk in hospital room?: A Little Help needed climbing 3-5 steps with a railing? : A Little 6 Click Score: 20    End of Session   Activity Tolerance: Patient tolerated treatment well Patient left: with call bell/phone within reach;in bed Nurse Communication: Mobility status PT Visit Diagnosis: Unsteadiness on feet (R26.81);Other abnormalities of gait and mobility (R26.89);Muscle weakness (generalized) (M62.81)     Time: 8997-8988 PT Time Calculation (min) (ACUTE ONLY): 9 min  Charges:    $Therapeutic Activity: 8-22 mins PT General Charges $$ ACUTE PT VISIT: 1 Visit                     Theo Ferretti, PT, DPT Acute Rehabilitation Services  Office: [phone]    [name] 09/23/2024, 11:33 AM

## 2024-09-23 NOTE — Progress Notes (Signed)
 Occupational Therapy Treatment Patient Details Name: Stephanie Daniel MRN: [id number] DOB: [birth year] Today's Date: 09/23/2024   History of present illness Pt is a 73 y.o. female who presented 09/19/24 with weakness, numbness, and tingling in all 4 extremities, worsening baseline tremors, diplopia, and vertigo. She has had a poor appetite for ~6 weeks. Pt admitted with severe symptomatic hypocalcemia, severe hypomagnesemia, and hypokalemia. PMH: CAD, SVT, HTN, HLD, GERD, hypothyroidism   OT comments  Patient seen for ADL prior to discharge, Mod I for stand grooming, in room mobility/toileting and ADL completion from a sit to stand level.  No further OT needs in the acute setting.  No post acute OT recommended.        If plan is discharge home, recommend the following:  Assist for transportation   Equipment Recommendations  None recommended by OT    Recommendations for Other Services      Precautions / Restrictions Precautions Precautions: Fall Recall of Precautions/Restrictions: Intact Restrictions Weight Bearing Restrictions Per Provider Order: No       Mobility Bed Mobility Overal bed mobility: Modified Independent                  Transfers Overall transfer level: Modified independent                       Balance Overall balance assessment: Mild deficits observed, not formally tested                                         ADL either performed or assessed with clinical judgement   ADL Overall ADL's : Modified independent                                            Extremity/Trunk Assessment Upper Extremity Assessment Upper Extremity Assessment: Overall WFL for tasks assessed   Lower Extremity Assessment Lower Extremity Assessment: Defer to PT evaluation   Cervical / Trunk Assessment Cervical / Trunk Assessment: Normal    Vision Patient Visual Report: No change from baseline     Perception  Perception Perception: Not tested   Praxis Praxis Praxis: Not tested   Communication Communication Communication: No apparent difficulties   Cognition Arousal: Alert Behavior During Therapy: WFL for tasks assessed/performed Cognition: No apparent impairments                               Following commands: Intact        Cueing   Cueing Techniques: Verbal cues  Exercises      Shoulder Instructions       General Comments      Pertinent Vitals/ Pain       Pain Assessment Faces Pain Scale: No hurt Pain Intervention(s): Monitored during session                                                          Frequency           Progress Toward Goals  OT Goals(current goals can now be found in  the care plan section)  Progress towards OT goals: Goals met/education completed, patient discharged from OT  Acute Rehab OT Goals OT Goal Formulation: With patient Time For Goal Achievement: 09/26/24  Plan      Co-evaluation                 AM-PAC OT 6 Clicks Daily Activity     Outcome Measure   Help from another person eating meals?: None Help from another person taking care of personal grooming?: None Help from another person toileting, which includes using toliet, bedpan, or urinal?: None Help from another person bathing (including washing, rinsing, drying)?: None Help from another person to put on and taking off regular upper body clothing?: None Help from another person to put on and taking off regular lower body clothing?: None 6 Click Score: 24    End of Session    OT Visit Diagnosis: Unsteadiness on feet (R26.81)   Activity Tolerance Patient tolerated treatment well   Patient Left in bed;with call bell/phone within reach   Nurse Communication Mobility status        Time: 8982-8967 OT Time Calculation (min): 15 min  Charges: OT General Charges $OT Visit: 1 Visit OT Treatments $Self Care/Home  Management : 8-22 mins  09/23/2024  RP, OTR/L  Acute Rehabilitation Services  Office:  [phone]   Stephanie Daniel 09/23/2024, 10:34 AM

## 2024-10-26 ENCOUNTER — Other Ambulatory Visit: Payer: Self-pay | Admitting: Family Medicine

## 2024-10-26 DIAGNOSIS — R911 Solitary pulmonary nodule: Secondary | ICD-10-CM

## 2024-10-26 DIAGNOSIS — R9389 Abnormal findings on diagnostic imaging of other specified body structures: Secondary | ICD-10-CM

## 2024-10-27 NOTE — Progress Notes (Unsigned)
 Patient ID: Stephanie Daniel, female   DOB: 12-09-1950, 73 y.o.   MRN: [id number]  Reason for Consult: No chief complaint on file.   Referred by Leonel Cole, MD  Subjective:     HPI Stephanie Daniel is a 73 y.o. female presenting for evaluation of aortic atherosclerosis.  A noncontrasted CT scan from October demonstrated significant calcific disease of the visceral aorta and its branches and significant calcific disease of the infrarenal aorta and bilateral iliac arteries. ***  Past Medical History:  Diagnosis Date   Arthritis    Coronary artery disease    GERD (gastroesophageal reflux disease)    History of kidney stones    Hypertension    Hypothyroidism    Family History  Problem Relation Age of Onset   COPD Mother    Heart disease Mother    Heart disease Father    Cancer Father    COPD Brother    Hypertension Son    Past Surgical History:  Procedure Laterality Date   ABDOMINAL HYSTERECTOMY     APPENDECTOMY     KNEE SURGERY  11/18/2011   SVT ABLATION N/A 08/21/2022   Procedure: SVT ABLATION;  Surgeon: Cindie Ole DASEN, MD;  Location: [facility];  Service: Cardiovascular;  Laterality: N/A;   TONSILLECTOMY     TOTAL KNEE ARTHROPLASTY Left 05/04/2017   Procedure: LEFT TOTAL KNEE ARTHROPLASTY;  Surgeon: Ernie Cough, MD;  Location: [facility];  Service: Orthopedics;  Laterality: Left;  90 mins   TOTAL KNEE ARTHROPLASTY Right 07/11/2022   Procedure: TOTAL KNEE ARTHROPLASTY;  Surgeon: Kay Kemps, MD;  Location: [facility];  Service: Orthopedics;  Laterality: Right;    Short Social History:  Social History   Tobacco Use   Smoking status: Former    Current packs/day: 0.00    Types: Cigarettes    Start date: 04/28/1969    Quit date: 04/28/1989    Years since quitting: 35.5   Smokeless tobacco: Never  Substance Use Topics   Alcohol use: Yes    Comment: Occas    Allergies[1]  Current Outpatient Medications  Medication Sig Dispense Refill   acyclovir  (ZOVIRAX )  400 MG tablet Take 400 mg by mouth 2 (two) times daily.     aspirin  EC 81 MG tablet Take 81 mg by mouth daily. Swallow whole.     atorvastatin  (LIPITOR) 40 MG tablet TAKE 1 TABLET(40 MG) BY MOUTH DAILY 90 tablet 2   buPROPion  (WELLBUTRIN  XL) 150 MG 24 hr tablet Take 150 mg by mouth daily.     cephALEXin (KEFLEX) 500 MG capsule Take 500 mg by mouth daily. Continuous course for UTI prophylaxis     DUPIXENT 300 MG/2ML SOAJ  (Patient not taking: Reported on 09/20/2024)     famotidine  (PEPCID ) 20 MG tablet Take 1 tablet (20 mg total) by mouth daily. 30 tablet 1   gabapentin  (NEURONTIN ) 300 MG capsule Take 300 mg by mouth at bedtime.     hydrOXYzine  (ATARAX ) 25 MG tablet Take 25 mg by mouth 2 (two) times daily as needed.     levocetirizine (XYZAL) 5 MG tablet Take 5 mg by mouth every evening.     levothyroxine  (SYNTHROID , LEVOTHROID) 50 MCG tablet Take 50 mcg by mouth daily before breakfast.     metoprolol  tartrate (LOPRESSOR ) 25 MG tablet Take 1 tablet (25 mg total) by mouth 2 (two) times daily. 60 tablet 1   nitroGLYCERIN  (NITROSTAT ) 0.4 MG SL tablet Place 1 tablet (0.4 mg total) under the  tongue every 5 (five) minutes x 3 doses as needed for chest pain. 20 tablet 1   ondansetron  (ZOFRAN ) 4 MG tablet Take 4 mg by mouth 2 (two) times daily.     Potassium 99 MG TABS Take 198 mg by mouth at bedtime.     No current facility-administered medications for this visit.    REVIEW OF SYSTEMS  All other systems were reviewed and are negative     Objective:  Objective   There were no vitals filed for this visit. There is no height or weight on file to calculate BMI.  Physical Exam General: no acute distress Cardiac: hemodynamically stable Abdomen: non-tender, no pulsatile mass*** Extremities: no edema, cyanosis or wounds*** Vascular:   Right: ***  Left: ***  Data: CT abdomen pelvis from 08/17/2024 reviewed Significant calcific disease of the visceral aorta and its branches, infrarenal aorta and  bilateral iliac arteries.     Assessment/Plan:   Stephanie Daniel is a 73 y.o. female with ***  Recommendations to optimize cardiovascular risk: Abstinence from all tobacco products. Blood glucose control with goal A1c < 7%. Blood pressure control with goal blood pressure < 140/90 mmHg. Lipid reduction therapy with goal LDL-C <55 mg/dL  Aspirin  81mg  PO QD.  Atorvastatin  40-80mg  PO QD (or other high intensity statin therapy).   Stephanie GORMAN Serve MD [facility]     [1]  Allergies Allergen Reactions   Albuterol Other (See Comments)    Respiratory distress   Oxycodone  Nausea And Vomiting

## 2024-10-28 ENCOUNTER — Ambulatory Visit (HOSPITAL_COMMUNITY): Admit: 2024-10-28 | Discharge: 2024-10-28 | Attending: Vascular Surgery

## 2024-10-28 ENCOUNTER — Encounter: Payer: Self-pay | Admitting: Vascular Surgery

## 2024-10-28 ENCOUNTER — Ambulatory Visit: Admitting: Vascular Surgery

## 2024-10-28 ENCOUNTER — Ambulatory Visit (HOSPITAL_COMMUNITY): Admission: RE | Admit: 2024-10-28 | Discharge: 2024-10-28 | Attending: Vascular Surgery

## 2024-10-28 VITALS — BP 193/92 | HR 68 | Temp 98.0°F | Resp 20 | Ht 63.0 in | Wt 149.8 lb

## 2024-10-28 DIAGNOSIS — M79606 Pain in leg, unspecified: Secondary | ICD-10-CM | POA: Diagnosis not present

## 2024-10-28 DIAGNOSIS — I771 Stricture of artery: Secondary | ICD-10-CM

## 2024-10-28 DIAGNOSIS — R9389 Abnormal findings on diagnostic imaging of other specified body structures: Secondary | ICD-10-CM | POA: Diagnosis not present

## 2024-10-28 DIAGNOSIS — R634 Abnormal weight loss: Secondary | ICD-10-CM | POA: Diagnosis not present

## 2024-10-28 DIAGNOSIS — I779 Disorder of arteries and arterioles, unspecified: Secondary | ICD-10-CM

## 2024-10-28 LAB — VAS US ABI WITH/WO TBI

## 2024-11-24 ENCOUNTER — Ambulatory Visit
Admission: RE | Admit: 2024-11-24 | Discharge: 2024-11-24 | Disposition: A | Discharge status: Home / Self Care | Source: Ambulatory Visit | Attending: Family Medicine | Admitting: Family Medicine

## 2024-11-24 DIAGNOSIS — R9389 Abnormal findings on diagnostic imaging of other specified body structures: Secondary | ICD-10-CM

## 2024-11-24 DIAGNOSIS — R911 Solitary pulmonary nodule: Secondary | ICD-10-CM
